# Patient Record
Sex: Male | Born: 1940 | Race: White | Hispanic: No | Marital: Married | Smoking: Former smoker
Health system: Southern US, Community
[De-identification: ages and names within clinical notes are randomized; demographics above are authoritative.]

## PROBLEM LIST (undated history)

## (undated) DIAGNOSIS — M779 Enthesopathy, unspecified: Secondary | ICD-10-CM

## (undated) DIAGNOSIS — M719 Bursopathy, unspecified: Secondary | ICD-10-CM

## (undated) DIAGNOSIS — M722 Plantar fascial fibromatosis: Secondary | ICD-10-CM

## (undated) DIAGNOSIS — I10 Essential (primary) hypertension: Secondary | ICD-10-CM

## (undated) DIAGNOSIS — K5790 Diverticulosis of intestine, part unspecified, without perforation or abscess without bleeding: Secondary | ICD-10-CM

## (undated) DIAGNOSIS — E785 Hyperlipidemia, unspecified: Secondary | ICD-10-CM

## (undated) DIAGNOSIS — N400 Enlarged prostate without lower urinary tract symptoms: Secondary | ICD-10-CM

## (undated) DIAGNOSIS — J439 Emphysema, unspecified: Secondary | ICD-10-CM

## (undated) DIAGNOSIS — K219 Gastro-esophageal reflux disease without esophagitis: Secondary | ICD-10-CM

## (undated) DIAGNOSIS — E119 Type 2 diabetes mellitus without complications: Secondary | ICD-10-CM

## (undated) DIAGNOSIS — C801 Malignant (primary) neoplasm, unspecified: Secondary | ICD-10-CM

## (undated) DIAGNOSIS — M1990 Unspecified osteoarthritis, unspecified site: Secondary | ICD-10-CM

## (undated) HISTORY — DX: Plantar fascial fibromatosis: M72.2

## (undated) HISTORY — DX: Bursopathy, unspecified: M71.9

## (undated) HISTORY — PX: OTHER SURGICAL HISTORY: SHX169

## (undated) HISTORY — PX: APPENDECTOMY: SHX54

## (undated) HISTORY — PX: CATARACT EXTRACTION: SUR2

## (undated) HISTORY — PX: COLONOSCOPY: SHX174

## (undated) HISTORY — PX: BACK SURGERY: SHX140

## (undated) HISTORY — PX: UPPER GI ENDOSCOPY: SHX6162

## (undated) HISTORY — DX: Essential (primary) hypertension: I10

## (undated) HISTORY — DX: Type 2 diabetes mellitus without complications: E11.9

## (undated) HISTORY — DX: Enthesopathy, unspecified: M77.9

## (undated) HISTORY — PX: FOOT SURGERY: SHX648

## (undated) HISTORY — PX: LAMINECTOMY: SHX219

## (undated) HISTORY — PX: ROTATOR CUFF REPAIR: SHX139

---

## 2003-12-02 ENCOUNTER — Inpatient Hospital Stay (HOSPITAL_COMMUNITY): Admission: RE | Admit: 2003-12-02 | Discharge: 2003-12-05 | Payer: Self-pay | Admitting: Neurosurgery

## 2005-02-08 ENCOUNTER — Other Ambulatory Visit: Payer: Self-pay

## 2005-02-08 ENCOUNTER — Inpatient Hospital Stay: Payer: Self-pay | Admitting: Internal Medicine

## 2006-08-11 ENCOUNTER — Ambulatory Visit: Payer: Self-pay | Admitting: General Practice

## 2006-08-31 ENCOUNTER — Ambulatory Visit: Payer: Self-pay | Admitting: General Practice

## 2006-09-14 ENCOUNTER — Ambulatory Visit: Payer: Self-pay | Admitting: General Practice

## 2006-09-25 ENCOUNTER — Encounter: Payer: Self-pay | Admitting: General Practice

## 2006-10-06 ENCOUNTER — Encounter: Payer: Self-pay | Admitting: General Practice

## 2006-11-04 ENCOUNTER — Encounter: Payer: Self-pay | Admitting: General Practice

## 2006-12-05 ENCOUNTER — Encounter: Payer: Self-pay | Admitting: General Practice

## 2007-01-04 ENCOUNTER — Ambulatory Visit: Payer: Self-pay | Admitting: Internal Medicine

## 2009-07-28 ENCOUNTER — Ambulatory Visit: Payer: Self-pay | Admitting: Ophthalmology

## 2013-01-21 DIAGNOSIS — M778 Other enthesopathies, not elsewhere classified: Secondary | ICD-10-CM

## 2013-01-21 HISTORY — DX: Other enthesopathies, not elsewhere classified: M77.8

## 2013-05-16 DIAGNOSIS — M719 Bursopathy, unspecified: Secondary | ICD-10-CM

## 2013-05-16 HISTORY — DX: Bursopathy, unspecified: M71.9

## 2013-05-25 ENCOUNTER — Encounter: Payer: Self-pay | Admitting: *Deleted

## 2013-05-25 DIAGNOSIS — M722 Plantar fascial fibromatosis: Secondary | ICD-10-CM | POA: Insufficient documentation

## 2013-06-05 ENCOUNTER — Ambulatory Visit (INDEPENDENT_AMBULATORY_CARE_PROVIDER_SITE_OTHER): Payer: Medicare Other | Admitting: Podiatry

## 2013-06-05 ENCOUNTER — Encounter: Payer: Self-pay | Admitting: Podiatry

## 2013-06-05 VITALS — BP 122/77 | HR 97 | Temp 96.9°F | Resp 16 | Ht 73.0 in | Wt 211.2 lb

## 2013-06-05 DIAGNOSIS — M109 Gout, unspecified: Secondary | ICD-10-CM

## 2013-06-05 NOTE — Patient Instructions (Signed)
Arthritis - Gout Gout is caused by uric acid crystals forming in the joint. The big toe, foot, ankle, and knee are the joints most often affected.Often uric acid levels in the blood are also elevated. Symptoms develop rapidly, usually over hours. A joint fluid exam may be needed to prove the diagnosis. Acute gout episodes may follow a minor injury, surgery, illness or medication change. Gout occurs more often in men. It tends to be an inherited (passed down from a family member) condition. Your chance of having gout is increased if you take certain medications. These include water pills (diuretics), aspirin, niacin, and cyclosporine. Kidney disease and alcohol consumption may also increase your chances of getting gout. Months or years can pass between gout attacks.  Treatment includes ice, rest and raising the affected limb until the swelling and pain are better.Anti-inflammatory medicine usually brings about dramatic relief of pain, redness and swelling within 2 to 3 days. Other medications can also be effective. Increase your fluid intake. Do not drink alcohol or eat liver, sweetbreads, or sardines. Long-term gout management may require medicine to lower blood uric acid levels, or changing or stopping diuretics. Please see your caregiver if your condition is not better after 1 to 2 days of treatment.  SEEK IMMEDIATE MEDICAL CARE IF:  You have more serious symptoms such as a fever, skin rash, diarrhea, vomiting, or other joint pains. Document Released: 09/29/2004 Document Revised: 08/11/2011 Document Reviewed: 10/13/2008 [facility] Patient Information 2012 [place], [place].Gout Gout is an inflammatory condition (arthritis) caused by a buildup of uric acid crystals in the joints. Uric acid is a chemical that is normally present in the blood. Under some circumstances, uric acid can form into crystals in your joints. This causes joint redness, soreness, and swelling (inflammation). Repeat attacks are common. Over  time, uric acid crystals can form into masses (tophi) near a joint, causing disfigurement. Gout is treatable and often preventable. CAUSES  The disease begins with elevated levels of uric acid in the blood. Uric acid is produced by your body when it breaks down a naturally found substance called purines. This also happens when you eat certain foods such as meats and fish. Causes of an elevated uric acid level include:  Being passed down from parent to child (heredity).  Diseases that cause increased uric acid production (obesity, psoriasis, some cancers).  Excessive alcohol use.  Diet, especially diets rich in meat and seafood.  Medicines, including certain cancer-fighting drugs (chemotherapy), diuretics, and aspirin.  Chronic kidney disease. The kidneys are no longer able to remove uric acid well.  Problems with metabolism. Conditions strongly associated with gout include:  Obesity.  High blood pressure.  High cholesterol.  Diabetes. Not everyone with elevated uric acid levels gets gout. It is not understood why some people get gout and others do not. Surgery, joint injury, and eating too much of certain foods are some of the factors that can lead to gout. SYMPTOMS   An attack of gout comes on quickly. It causes intense pain with redness, swelling, and warmth in a joint.  Fever can occur.  Often, only one joint is involved. Certain joints are more commonly involved:  Base of the big toe.  Knee.  Ankle.  Wrist.  Finger. Without treatment, an attack usually goes away in a few days to weeks. Between attacks, you usually will not have symptoms, which is different from many other forms of arthritis. DIAGNOSIS  Your caregiver will suspect gout based on your symptoms and exam. Removal of  fluid from the joint (arthrocentesis) is done to check for uric acid crystals. Your caregiver will give you a medicine that numbs the area (local anesthetic) and use a needle to remove joint  fluid for exam. Gout is confirmed when uric acid crystals are seen in joint fluid, using a special microscope. Sometimes, blood, urine, and X-ray tests are also used. TREATMENT  There are 2 phases to gout treatment: treating the sudden onset (acute) attack and preventing attacks (prophylaxis). Treatment of an Acute Attack  Medicines are used. These include anti-inflammatory medicines or steroid medicines.  An injection of steroid medicine into the affected joint is sometimes necessary.  The painful joint is rested. Movement can worsen the arthritis.  You may use warm or cold treatments on painful joints, depending which works best for you.  Discuss the use of coffee, vitamin C, or cherries with your caregiver. These may be helpful treatment options. Treatment to Prevent Attacks After the acute attack subsides, your caregiver may advise prophylactic medicine. These medicines either help your kidneys eliminate uric acid from your body or decrease your uric acid production. You may need to stay on these medicines for a very long time. The early phase of treatment with prophylactic medicine can be associated with an increase in acute gout attacks. For this reason, during the first few months of treatment, your caregiver may also advise you to take medicines usually used for acute gout treatment. Be sure you understand your caregiver's directions. You should also discuss dietary treatment with your caregiver. Certain foods such as meats and fish can increase uric acid levels. Other foods such as dairy can decrease levels. Your caregiver can give you a list of foods to avoid. HOME CARE INSTRUCTIONS   Do not take aspirin to relieve pain. This raises uric acid levels.  Only take over-the-counter or prescription medicines for pain, discomfort, or fever as directed by your caregiver.  Rest the joint as much as possible. When in bed, keep sheets and blankets off painful areas.  Keep the affected joint  raised (elevated).  Use crutches if the painful joint is in your leg.  Drink enough water and fluids to keep your urine clear or pale yellow. This helps your body get rid of uric acid. Do not drink alcoholic beverages. They slow the passage of uric acid.  Follow your caregiver's dietary instructions. Pay careful attention to the amount of protein you eat. Your daily diet should emphasize fruits, vegetables, whole grains, and fat-free or low-fat milk products.  Maintain a healthy body weight. SEEK MEDICAL CARE IF:   You have an oral temperature above 102 F (38.9 C).  You develop diarrhea, vomiting, or any side effects from medicines.  You do not feel better in 24 hours, or you are getting worse. SEEK IMMEDIATE MEDICAL CARE IF:   Your joint becomes suddenly more tender and you have:  Chills.  An oral temperature above 102 F (38.9 C), not controlled by medicine. MAKE SURE YOU:   Understand these instructions.  Will watch your condition.  Will get help right away if you are not doing well or get worse. Document Released: 08/19/2000 Document Revised: 11/14/2011 Document Reviewed: 11/30/2009 [facility] Patient Information 2014 [place], [place].

## 2013-06-05 NOTE — Progress Notes (Signed)
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 He states that he is feeling 100% better. He states that the prednisone increased his blood sugar. But otherwise he is doing just fine.  Objective: I reviewed his past medical history medications and allergies. Currently he has strong palpable pulses bilaterally he has no pain on palpation to the medial ankle with no erythema edema cellulitis drainage or odor. He has full function of of the ankle.  Assessment: Resolving gouty capsulitis right.  Plan: Currently Jeremy Valencia will continue all conservative therapies. He is to followup with his primary doctor in the near future for a blood draw to evaluate his uric acid levels. I will followup with Jeremy Valencia in the near future for his routine physical exam.

## 2013-07-22 ENCOUNTER — Ambulatory Visit: Payer: Self-pay | Admitting: Podiatry

## 2013-07-29 ENCOUNTER — Ambulatory Visit: Payer: Self-pay | Admitting: Unknown Physician Specialty

## 2013-07-31 ENCOUNTER — Ambulatory Visit (INDEPENDENT_AMBULATORY_CARE_PROVIDER_SITE_OTHER): Payer: Medicare Other | Admitting: Podiatry

## 2013-07-31 ENCOUNTER — Encounter: Payer: Self-pay | Admitting: Podiatry

## 2013-07-31 VITALS — BP 154/92 | HR 97 | Resp 16 | Ht 74.0 in | Wt 206.0 lb

## 2013-07-31 DIAGNOSIS — M7750 Other enthesopathy of unspecified foot: Secondary | ICD-10-CM

## 2013-07-31 NOTE — Progress Notes (Signed)
Jeremy Valencia presents today with a chief complaint of a painful first metatarsophalangeal joint right. He states the last time we put medicine in about 6 months ago was started feeling much better and still feels better than it did at that time. He denies any trauma to it there he does state that he is very active individual.  Objective: Pulses remain palpable bilateral lower extremity neurologic sensorium is intact bilateral. He has tenderness on range of motion and particularly dorsal and range of motion at the first metatarsophalangeal joint right foot.  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  Plan: We discussed the etiology pathology conservative versus surgical therapies. At this point we injected dexamethasone into the joint only 2 mg was utilized with local anesthetic after sterile Betadine skin prep was performed. He tolerated procedure well and I will followup with him in 6 months or sooner if needed.

## 2013-10-24 ENCOUNTER — Telehealth: Payer: Self-pay | Admitting: *Deleted

## 2013-10-24 ENCOUNTER — Other Ambulatory Visit: Payer: Self-pay | Admitting: *Deleted

## 2013-10-24 DIAGNOSIS — M109 Gout, unspecified: Secondary | ICD-10-CM

## 2013-10-24 MED ORDER — METHYLPREDNISOLONE 4 MG PO KIT: PACK | ORAL | Status: DC

## 2013-10-24 NOTE — Telephone Encounter (Signed)
Jeremy Valencia called stated that his gout acting up wants medrol dos pak, approved the medrol dos pak and will get bloodwork

## 2013-10-24 NOTE — Telephone Encounter (Signed)
Jeremy Valencia called stating he is having problem with gout, wanted refill on medrol dos pak. Refilled meds and wrote for bloodwork to check for gout

## 2013-10-25 LAB — C-REACTIVE PROTEIN: CRP: 6.2 mg/L — AB (ref 0.0–4.9)

## 2013-10-25 LAB — RHEUMATOID FACTOR: RHEUMATOID FACTOR: 10.4 [IU]/mL (ref 0.0–13.9)

## 2013-10-25 LAB — URIC ACID: URIC ACID: 5.5 mg/dL (ref 3.7–8.6)

## 2013-10-25 LAB — SEDIMENTATION RATE: SED RATE: 2 mm/h (ref 0–30)

## 2013-10-25 LAB — ANA: ANA: NEGATIVE

## 2014-01-29 ENCOUNTER — Ambulatory Visit: Payer: Medicare Other | Admitting: Podiatry

## 2014-02-12 ENCOUNTER — Ambulatory Visit (INDEPENDENT_AMBULATORY_CARE_PROVIDER_SITE_OTHER): Payer: Medicare Other | Admitting: Podiatry

## 2014-02-12 ENCOUNTER — Encounter: Payer: Self-pay | Admitting: Podiatry

## 2014-02-12 DIAGNOSIS — M779 Enthesopathy, unspecified: Principal | ICD-10-CM

## 2014-02-12 DIAGNOSIS — M7750 Other enthesopathy of unspecified foot: Secondary | ICD-10-CM

## 2014-02-12 DIAGNOSIS — M778 Other enthesopathies, not elsewhere classified: Secondary | ICD-10-CM

## 2014-02-12 NOTE — Progress Notes (Signed)
He presents today complaining of his first metatarsophalangeal joint pain particularly as he passes over the foot. Otherwise he states her feet are doing quite well.  Objective: Pulses are palpable bilateral. He has pain on in range of motion of the first metatarsophalangeal joint right foot indicative of hallux limitus and capsulitis.  Assessment: Capsulitis of the first metatarsophalangeal joint of the right foot with mild hallux limitus.  Plan: Injected the first metatarsophalangeal joint today after sterile Betadine skin prep with local anesthetic and dexamethasone. I will followup with him in 6 months for routine evaluation.

## 2014-04-17 ENCOUNTER — Other Ambulatory Visit: Payer: Self-pay | Admitting: *Deleted

## 2014-04-17 MED ORDER — GABAPENTIN 100 MG PO CAPS: 100.0000 mg | ORAL_CAPSULE | Freq: Three times a day (TID) | ORAL | Status: DC

## 2014-04-17 NOTE — Telephone Encounter (Signed)
[facility] sent refill request fo neurontin 100 mg #90 with 3 refills one capsule by mouth 3 times daily. Per dr Milinda Pointer fill rx.

## 2014-04-24 ENCOUNTER — Other Ambulatory Visit: Payer: Self-pay | Admitting: *Deleted

## 2014-04-24 MED ORDER — GABAPENTIN 100 MG PO CAPS: 100.0000 mg | ORAL_CAPSULE | Freq: Three times a day (TID) | ORAL | Status: DC

## 2014-04-28 ENCOUNTER — Other Ambulatory Visit: Payer: Self-pay | Admitting: *Deleted

## 2014-04-28 MED ORDER — GABAPENTIN 100 MG PO CAPS: 100.0000 mg | ORAL_CAPSULE | Freq: Three times a day (TID) | ORAL | Status: DC

## 2014-04-28 NOTE — Telephone Encounter (Signed)
[facility] sent refill for neurontin 100 mg #90 with 3 refills. Take one by mouth 3 times daily. Per dr Milinda Pointer refill.

## 2014-07-17 DIAGNOSIS — M722 Plantar fascial fibromatosis: Secondary | ICD-10-CM | POA: Insufficient documentation

## 2014-07-17 DIAGNOSIS — M7750 Other enthesopathy of unspecified foot: Secondary | ICD-10-CM | POA: Insufficient documentation

## 2014-07-18 DIAGNOSIS — I10 Essential (primary) hypertension: Secondary | ICD-10-CM | POA: Insufficient documentation

## 2014-08-13 ENCOUNTER — Ambulatory Visit (INDEPENDENT_AMBULATORY_CARE_PROVIDER_SITE_OTHER): Payer: Medicare Other | Admitting: Podiatry

## 2014-08-13 VITALS — BP 160/96 | HR 87 | Resp 16

## 2014-08-13 DIAGNOSIS — M779 Enthesopathy, unspecified: Principal | ICD-10-CM

## 2014-08-13 DIAGNOSIS — M778 Other enthesopathies, not elsewhere classified: Secondary | ICD-10-CM

## 2014-08-13 DIAGNOSIS — M7751 Other enthesopathy of right foot: Secondary | ICD-10-CM

## 2014-08-13 MED ORDER — AZITHROMYCIN 250 MG PO TABS: ORAL_TABLET | ORAL | Status: DC

## 2014-08-13 NOTE — Progress Notes (Signed)
He presents today complaining of pain to his first metatarsophalangeal joint right foot and the dorsal aspect of the right foot.  Objective: Vital signs stable he is alert and oriented 3. He still has pain on palpation and range of motion of the first metatarsophalangeal joint right foot as well as Lisfranc's joints.  Assessment: Osteoarthritis and capsulitis midfoot right.  Plan: Injected dexamethasone to the first metatarsophalangeal joint of the right foot and Kenalog to the dorsal aspect of the right foot. We'll follow-up with him in 6 months.

## 2014-11-17 ENCOUNTER — Emergency Department: Payer: Self-pay | Admitting: Emergency Medicine

## 2014-11-18 ENCOUNTER — Inpatient Hospital Stay: Payer: Self-pay | Admitting: Internal Medicine
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
## 2015-01-04 NOTE — Discharge Summary (Signed)
PATIENT NAME:  Jeremy Valencia, Jeremy Valencia MR#:  [id number] DATE OF BIRTH:  [birth year]  DATE OF ADMISSION:  11/18/2014 DATE OF DISCHARGE:  11/21/2014  FINAL DIAGNOSES: 1.  Urinary tract infection.  2.  Sepsis secondary to #1, with septic shock.  3.  Metabolic encephalopathy secondary to sepsis.  4.  Hypertension.  5.  Adult onset diabetes mellitus.  6.  Dehydration.  7.  Hypomagnesemia.  8.  Hypokalemia.  9.  Elevated troponin secondary to demand ischemia.  10.  Rhabdomyolysis.   HISTORY AND PHYSICAL: Please see dictated admission history and physical.   Speed: The patient was admitted with confusion, hypotension. He had recently been in the Emergency Room with urinary tract infection noted on urinalysis. He had been started on Cipro at home. He was placed on Zosyn. Cultures were sent, which remained negative during his hospitalization. His blood pressure and mentation rapidly improved with the antibiotic. Hydration was administered and his electrolyte abnormalities were corrected. Cardiac enzymes were followed, with mild elevation, but with no significant peaking. Cardiology was consulted, they felt this represented demand ischemia, likely in the face of his hypotension and infection. Echocardiogram was performed, which revealed preserved LV function. He was weaned off IV fluid as his heart rate and blood pressure improved with treatment for the sepsis. He was switched over to oral Levaquin and followed over the next 24 hours. His white count continued to improve and he defervesced. He was ambulating, eating well, with good bowel and bladder function and felt ready to go home. At this point, he will be discharged to home in stable condition with his physical activity to be up as tolerated. He should follow a low carbohydrate diet. It is recommended that he check his blood pressure and blood sugar daily and record this. He will follow up in our office in the next 1 to 2 weeks.   DISCHARGE  MEDICATIONS: 1.  Metformin 500 two mg p.o. daily.  2.  Lipitor 10 mg p.o. daily.  3.  Multivitamin 1 p.o. daily.  4.  Aspirin 81 mg p.o. daily.  5.  Quinapril 20 mg p.o. daily.  6.  Levaquin 500 mg p.o. daily x10 days to complete a 2 week course of antibiotics.   At this point, he will hold hydrochlorothiazide and glimepiride. He will likely need to reinstitute these medications as an outpatient and will discuss this with him at his followup. He will stop Cipro as this has been replaced with the Levaquin.   ____________________________ Adin Hector, MD bjk:sb D: 11/21/2014 07:48:21 ET T: 11/21/2014 08:50:11 ET JOB#: [id number]  cc: Adin Hector, MD, <Dictator> Ramonita Lab MD ELECTRONICALLY SIGNED 11/27/2014 8:28

## 2015-01-04 NOTE — Consult Note (Signed)
PATIENT NAME:  Jeremy Valencia, Jeremy Valencia MR#:  [id number] DATE OF BIRTH:  12-Jan-1941  DATE OF CONSULTATION:  11/18/2014  REFERRING PHYSICIAN:   CONSULTING PHYSICIAN:  Isaias Cowman, MD  PRIMARY CARE PHYSICIAN:  Tama High, III, MD  CHIEF COMPLAINT: Weakness and fever.   REASON FOR CONSULTATION: Consultation requested for evaluation of elevated troponin.   HISTORY OF PRESENT ILLNESS: The patient is a 74 year old gentleman with history of diabetes, hyperlipidemia and hypertension, who is admitted with chief complaint of weakness, confusion, and fever. The patient was seen in [facility] Emergency Room and diagnosed with a urinary tract infection, discharged home only to return with worsening weakness, decreased mental status and shaking chills. The patient was noted to  have a fever of 103. Admission labs were notable for borderline elevated troponin at 0.44 in the absence of chest pain. EKG revealed normal sinus rhythm, normal ECG.   PAST MEDICAL HISTORY: 1. Diabetes.   2. Hyperlipidemia.  3. Hypertension.  4. Gastroesophageal reflux disease.  5. History of diverticulosis.   MEDICATIONS: Aspirin 81 mg daily, atorvastatin 1 daily, hydrochlorothiazide 25 mg daily, quinapril 10 mg daily, glimepiride 10 mg daily, metformin 1000 mg q.a.m.    SOCIAL HISTORY: The patient is married, resides with his wife. He quit tobacco abuse 18 years ago.   FAMILY HISTORY: No immediate family history for myocardial infarction or coronary artery disease.   REVIEW OF SYSTEMS: CONSTITUTIONAL: The patient does have fever and chills.   EYES: No blurry vision.  EARS: No hearing loss.  RESPIRATORY: No shortness of breath.  CARDIOVASCULAR: No chest pain.  GASTROINTESTINAL: The patient does have some mild nausea.  GENITOURINARY: No dysuria or hematuria.  ENDOCRINE: No polyuria or polydipsia.  MUSCULOSKELETAL: No arthralgias or myalgias.  NEUROLOGICAL: No focal muscle weakness or numbness.  PSYCHOLOGICAL: No depression  or anxiety.   PHYSICAL EXAMINATION: VITAL SIGNS: Blood pressure 126/75, pulse 90, respirations 20, temperature 98.5, pulse oximetry 97%.  HEENT: Pupils equally reactive to light and accommodation.  NECK: Supple without thyromegaly.  LUNGS: Clear.  HEART: Normal JVP. Normal PMI. Regular rate and rhythm. Normal S1, S2. No appreciable gallop, murmur, or rub.  ABDOMEN: Soft and nontender. Pulses were intact bilaterally.  MUSCULOSKELETAL: Normal muscle tone.  NEUROLOGIC: The patient is alert and oriented x 3. Motor and sensory both grossly intact.   IMPRESSION: A 74 year old gentleman who likely presents with a urinary tract infection, urosepsis, fever and chills, dehydration with borderline elevated troponin very likely due to demand supply ischemia and not due to acute coronary syndrome.  EKG is normal.   RECOMMENDATIONS: 1. Agree with overall current therapy.  2. Would defer full dose anticoagulation.  3. Review 2D echocardiogram.  4. Would defer further cardiac diagnostics at this time.    ____________________________ Isaias Cowman, MD ap:tr D: 11/18/2014 17:23:51 ET T: 11/18/2014 17:38:15 ET JOB#: [id number]  cc: Isaias Cowman, MD, <Dictator> Isaias Cowman MD ELECTRONICALLY SIGNED 12/02/2014 12:46

## 2015-01-08 DIAGNOSIS — G8929 Other chronic pain: Secondary | ICD-10-CM | POA: Insufficient documentation

## 2015-01-08 DIAGNOSIS — M545 Low back pain, unspecified: Secondary | ICD-10-CM | POA: Insufficient documentation

## 2015-02-11 ENCOUNTER — Ambulatory Visit (INDEPENDENT_AMBULATORY_CARE_PROVIDER_SITE_OTHER): Payer: Medicare Other | Admitting: Podiatry

## 2015-02-11 DIAGNOSIS — M778 Other enthesopathies, not elsewhere classified: Secondary | ICD-10-CM

## 2015-02-11 DIAGNOSIS — M7751 Other enthesopathy of right foot: Secondary | ICD-10-CM | POA: Diagnosis not present

## 2015-02-11 DIAGNOSIS — M779 Enthesopathy, unspecified: Principal | ICD-10-CM

## 2015-02-11 NOTE — Progress Notes (Signed)
Jeremy Valencia presents today for 6 month checkup. He states that his diabetes is doing good. He does have pain in the first metatarsophalangeal joint of the right foot.  Objective: Vital signs are stable he is alert and oriented 3. Pulses are palpable. Neurologic sensorium is intact bilateral. He has pain and crepitation on range of motion of the first metatarsophalangeal joint right foot.  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  Plan: After sterile Betadine skin prep I injected the joint today with 4 mg of dexamethasone and local and aesthetic. He tolerated this procedure well and I will follow-up with him in 6 months. He'll call sooner if needed.

## 2015-02-20 ENCOUNTER — Telehealth: Payer: Self-pay | Admitting: Urology

## 2015-02-20 ENCOUNTER — Other Ambulatory Visit: Payer: Self-pay

## 2015-02-20 DIAGNOSIS — N390 Urinary tract infection, site not specified: Secondary | ICD-10-CM

## 2015-02-20 NOTE — Telephone Encounter (Signed)
CT ordered. Cw,lpn

## 2015-02-20 NOTE — Telephone Encounter (Signed)
Patient was seen in the office on 01/01/15 by Dr. Elnoria Howard.  Dr. Elnoria Howard ordered a CT abdomen & pelvis with & without contrast.  Please enter this order in the epic system.

## 2015-03-02 ENCOUNTER — Ambulatory Visit
Admission: RE | Admit: 2015-03-02 | Discharge: 2015-03-02 | Disposition: A | Discharge status: Home / Self Care | Payer: Medicare Other | Source: Ambulatory Visit | Attending: Urology | Admitting: Urology

## 2015-03-02 DIAGNOSIS — N390 Urinary tract infection, site not specified: Secondary | ICD-10-CM | POA: Diagnosis not present

## 2015-03-02 DIAGNOSIS — A4189 Other specified sepsis: Secondary | ICD-10-CM | POA: Diagnosis not present

## 2015-03-02 DIAGNOSIS — R509 Fever, unspecified: Secondary | ICD-10-CM | POA: Insufficient documentation

## 2015-03-02 HISTORY — DX: Malignant (primary) neoplasm, unspecified: C80.1

## 2015-03-02 MED ORDER — IOHEXOL 300 MG/ML  SOLN
125.0000 mL | Freq: Once | INTRAMUSCULAR | Status: AC | PRN
  Administered 2015-03-02: 125 mL via INTRAVENOUS

## 2015-03-13 ENCOUNTER — Encounter: Payer: Self-pay | Admitting: Urology

## 2015-03-13 ENCOUNTER — Ambulatory Visit (INDEPENDENT_AMBULATORY_CARE_PROVIDER_SITE_OTHER): Payer: Medicare Other | Admitting: Urology

## 2015-03-13 VITALS — BP 131/77 | HR 89 | Resp 18 | Ht 73.0 in | Wt 207.4 lb

## 2015-03-13 DIAGNOSIS — K5790 Diverticulosis of intestine, part unspecified, without perforation or abscess without bleeding: Secondary | ICD-10-CM | POA: Insufficient documentation

## 2015-03-13 DIAGNOSIS — N400 Enlarged prostate without lower urinary tract symptoms: Secondary | ICD-10-CM | POA: Diagnosis not present

## 2015-03-13 DIAGNOSIS — E119 Type 2 diabetes mellitus without complications: Secondary | ICD-10-CM | POA: Insufficient documentation

## 2015-03-13 DIAGNOSIS — K219 Gastro-esophageal reflux disease without esophagitis: Secondary | ICD-10-CM | POA: Insufficient documentation

## 2015-03-13 LAB — URINALYSIS, COMPLETE
BILIRUBIN UA: NEGATIVE
KETONES UA: NEGATIVE
LEUKOCYTES UA: NEGATIVE
Nitrite, UA: NEGATIVE
PROTEIN UA: NEGATIVE
RBC UA: NEGATIVE
Specific Gravity, UA: 1.02 (ref 1.005–1.030)
Urobilinogen, Ur: 0.2 mg/dL (ref 0.2–1.0)
pH, UA: 7 (ref 5.0–7.5)

## 2015-03-13 LAB — MICROSCOPIC EXAMINATION: Bacteria, UA: NONE SEEN

## 2015-03-13 NOTE — Progress Notes (Signed)
    Cystoscopy Procedure Note  Patient identification was confirmed, informed consent was obtained, and patient was prepped using Betadine solution.  Lidocaine jelly was administered per urethral meatus.    Preoperative abx where received prior to procedure.     Pre-Procedure: - Inspection reveals a normal caliber ureteral meatus.  Procedure: The flexible cystoscope was introduced without difficulty - No urethral strictures/lesions are present. - normal prostate  - Normal bladder neck - Bilateral ureteral orifices identified - Bladder mucosa  reveals no ulcers, tumors, or lesions - No bladder stones - No trabeculation  Retroflexion shows no tumors or masses in the bladder or at the bladder neck.    Post-Procedure: - Patient tolerated the procedure well

## 2015-03-13 NOTE — Progress Notes (Signed)
03/13/2015 3:10 PM   Jeremy Valencia 07/22/1941 [id number]  Referring provider: Adin Hector, MD [place],  [id number]  Chief Complaint  Patient presents with  . Procedure    Cysto  . Results    CT    HPI: Patient here for  cystoscopy report in assessment.     PMH: Past Medical History  Diagnosis Date  . Bursitis 05/16/2013    BURSITIS/CAPSULITIS RT ANKLE  . Diabetes mellitus without complication   . Hypertension   . Capsulitis of right foot 01/21/2013    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   . Plantar fasciitis   . Cancer     skin cancer    Surgical History: No past surgical history on file.  Home Medications:    Medication List       This list is accurate as of: 03/13/15  3:10 PM.  Always use your most recent med list.               ADVIL PM PO  Take by mouth at bedtime as needed.     aspirin 81 MG tablet  Take 81 mg by mouth daily.     atorvastatin 10 MG tablet  Commonly known as:  LIPITOR  Take 10 mg by mouth daily.     azithromycin 250 MG tablet  Commonly known as:  ZITHROMAX Z-PAK  Take as directed     CO Q 10 PO  Take by mouth daily.     D3-1000 PO  Take by mouth daily.     DULoxetine 30 MG capsule  Commonly known as:  CYMBALTA  Take by mouth.     Fish Oil 1000 MG Caps  Take by mouth daily.     FLAX SEEDS PO  Take by mouth daily.     gabapentin 100 MG capsule  Commonly known as:  NEURONTIN  Take 1 capsule (100 mg total) by mouth 3 (three) times daily.     glimepiride 2 MG tablet  Commonly known as:  AMARYL  Take 2 mg by mouth daily before breakfast.     hydrochlorothiazide 25 MG tablet  Commonly known as:  HYDRODIURIL  Take 25 mg by mouth daily.     hydrocortisone valerate ointment 0.2 %  Commonly known as:  WEST-CORT     metFORMIN 1000 MG tablet  Commonly known as:  GLUCOPHAGE  Take 1,000 mg by mouth 2 (two) times daily with a meal.     methylPREDNISolone 4 MG tablet  Commonly known  as:  MEDROL DOSEPAK  follow package directions     multivitamin tablet  Take 1 tablet by mouth daily.     quinapril 20 MG tablet  Commonly known as:  ACCUPRIL  Take 20 mg by mouth 2 (two) times daily.     sulfamethoxazole-trimethoprim 800-160 MG per tablet  Commonly known as:  BACTRIM DS,SEPTRA DS  Take by mouth.     tobramycin-dexamethasone ophthalmic solution  Commonly known as:  TOBRADEX     VISION FORMULA PO  Take by mouth daily.     vitamin C 1000 MG tablet  Take 1,000 mg by mouth daily.     Zinc Acetate 50 MG Caps        Allergies:  Allergies  Allergen Reactions  . Rofecoxib Other (See Comments)    Family History: Family History  Problem Relation Age of Onset  . Family history unknown: Yes    Social History:  reports that  he has quit smoking. He has never used smokeless tobacco. He reports that he drinks alcohol. He reports that he does not use illicit drugs.  ROS: Urological Symptom Review  Patient is experiencing the following symptoms: Urinary tract infection   Review of Systems     Physical Exam: BP 131/77 mmHg  Pulse 89  Resp 18  Ht '6\' 1"'  (1.854 m)  Wt 207 lb 6.4 oz (94.076 kg)  BMI 27.37 kg/m2  Constitutional:  Alert and oriented, No acute distress. HEENT: [place] AT, moist mucus membranes.  Trachea midline, no masses. Cardiovascular: No clubbing, cyanosis, or edema. Respiratory: Normal respiratory effort, no increased work of breathing. GI: Abdomen is soft, nontender, nondistended, no abdominal masses GU: No CVA tenderness. Prostate is small grade 1 over 4 with no nodules masses or growths. Skin: No rashes, bruises or suspicious lesions. Lymph: No cervical or inguinal adenopathy. Neurologic: Grossly intact, no focal deficits, moving all 4 extremities. Psychiatric: Normal mood and affect.  Laboratory Data: No results found for: WBC, HGB, HCT, MCV, PLT  No results found for: CREATININE  No results found for: PSA  No results found  for: TESTOSTERONE  No results found for: HGBA1C  Urinalysis No results found for: COLORURINE, APPEARANCEUR, LABSPEC, [place], GLUCOSEU, HGBUR, BILIRUBINUR, KETONESUR, PROTEINUR, UROBILINOGEN, NITRITE, LEUKOCYTESUR  Pertinent Imaging: CT reviewed no stones no prostate enlargement and no renal or bladder masses.  Assessment & Plan:  Cystoscopy revealed no obstructive prostate. He does have a prostate enlargement but it's by lateral lobe enlargement is not obstructed. Bladder appeared normal. Origin of his urinary tract infection that he had 8 in with gross hematuria is probably diabetic diabetic in nature. Plan to see him again in 6 months.   1.  Plan to see him again in 6 monthsBPH (benign prostatic hyperplasia) - Urinalysis, Complete   No Follow-up on file.  Collier Flowers, [facility] [address] [place], [place] [address] [phone]

## 2015-05-19 ENCOUNTER — Other Ambulatory Visit: Payer: Self-pay | Admitting: Podiatry

## 2015-05-19 NOTE — Telephone Encounter (Signed)
Pt must have a 6 month check up prior to future refills, this should get him to the 08/2015 time frame.

## 2015-06-19 ENCOUNTER — Encounter: Payer: Self-pay | Admitting: Urology

## 2015-06-19 ENCOUNTER — Ambulatory Visit (INDEPENDENT_AMBULATORY_CARE_PROVIDER_SITE_OTHER): Payer: Medicare Other | Admitting: Urology

## 2015-06-19 VITALS — BP 139/76 | HR 101 | Ht 73.0 in | Wt 208.3 lb

## 2015-06-19 DIAGNOSIS — N400 Enlarged prostate without lower urinary tract symptoms: Secondary | ICD-10-CM | POA: Diagnosis not present

## 2015-06-19 DIAGNOSIS — R3129 Other microscopic hematuria: Secondary | ICD-10-CM | POA: Diagnosis not present

## 2015-06-19 DIAGNOSIS — E119 Type 2 diabetes mellitus without complications: Secondary | ICD-10-CM | POA: Insufficient documentation

## 2015-06-19 LAB — URINALYSIS, COMPLETE
BILIRUBIN UA: NEGATIVE
KETONES UA: NEGATIVE
LEUKOCYTES UA: NEGATIVE
Nitrite, UA: NEGATIVE
PH UA: 7 (ref 5.0–7.5)
PROTEIN UA: NEGATIVE
RBC UA: NEGATIVE
SPEC GRAV UA: 1.02 (ref 1.005–1.030)
UUROB: 0.2 mg/dL (ref 0.2–1.0)

## 2015-06-19 LAB — MICROSCOPIC EXAMINATION
Bacteria, UA: NONE SEEN
Epithelial Cells (non renal): NONE SEEN /hpf (ref 0–10)
RENAL EPITHEL UA: NONE SEEN /HPF
WBC, UA: NONE SEEN /hpf (ref 0–?)

## 2015-06-19 NOTE — Progress Notes (Signed)
06/19/2015 3:04 PM   Jeremy Valencia 18-Jul-1941 [id number]  Referring provider: Adin Hector, [place] [address] [place], [place] [address]  Chief Complaint  Patient presents with  . Benign Prostatic Hypertrophy    56month   HPI:  1 - Microscopic Hematuria - RBC's on UA x several. Eval 2016 with CT and cysto only with some moderate prostate hypertrophy. NO gross episodes.  2 - Enlarged Prostate - notbale bilobar hypertrophy by cysto 2016. Denies bothersome voiding complaints.  Today "[name] is seen in f/u above. No interval gross hematuria or bothersome LUTS. 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    PMH: Past Medical History  Diagnosis Date  . Bursitis 05/16/2013    BURSITIS/CAPSULITIS RT ANKLE  . Diabetes mellitus without complication ([facility]   . Hypertension   . Capsulitis of right foot 01/21/2013    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   . Plantar fasciitis   . Cancer ([facility]     skin cancer    Surgical History: History reviewed. No pertinent past surgical history.  Home Medications:    Medication List       This list is accurate as of: 06/19/15  3:04 PM.  Always use your most recent med list.               ADVIL PM PO  Take by mouth at bedtime as needed.     aspirin 81 MG tablet  Take 81 mg by mouth daily.     atorvastatin 20 MG tablet  Commonly known as:  LIPITOR     CO Q 10 PO  Take by mouth daily.     D3-1000 PO  Take by mouth daily.     DULoxetine 30 MG capsule  Commonly known as:  CYMBALTA  Take by mouth.     Fish Oil 1000 MG Caps  Take by mouth daily.     FLAX SEEDS PO  Take by mouth daily.     gabapentin 100 MG capsule  Commonly known as:  NEURONTIN  TAKE ONE CAPSULE BY MOUTH THREE TIMES DAILY     glimepiride 2 MG tablet  Commonly known as:  AMARYL  Take 2 mg by mouth daily before breakfast.     hydrochlorothiazide 25 MG tablet  Commonly known as:  HYDRODIURIL  Take 25 mg by mouth daily.     hydrocortisone valerate ointment 0.2 %  Commonly known as:  WEST-CORT     metFORMIN 1000 MG tablet  Commonly known as:  GLUCOPHAGE  Take 1,000 mg by mouth 2 (two) times daily with a meal.     multivitamin tablet  Take 1 tablet by mouth daily.     quinapril 20 MG tablet  Commonly known as:  ACCUPRIL  Take 20 mg by mouth 2 (two) times daily.     tobramycin-dexamethasone ophthalmic solution  Commonly known as:  TOBRADEX     vitamin C 1000 MG tablet  Take 1,000 mg by mouth daily.     Zinc Acetate 50 MG Caps        Allergies: No Known Allergies  Family History: Family History  Problem Relation Age of Onset  . Family history unknown: Yes    Social History:  reports that he has quit smoking. He has never used smokeless tobacco. He reports that he drinks alcohol. He reports that he does not use illicit drugs.  ROS: UROLOGY Frequent Urination?: No Hard to postpone urination?: No Burning/pain with  urination?: No Get up at night to urinate?: No Leakage of urine?: No Urine stream starts and stops?: No Trouble starting stream?: No Do you have to strain to urinate?: No Blood in urine?: No Urinary tract infection?: No Sexually transmitted disease?: No Injury to kidneys or bladder?: No Painful intercourse?: No Weak stream?: No Erection problems?: No Penile pain?: No  Gastrointestinal Nausea?: No Vomiting?: No Indigestion/heartburn?: No Diarrhea?: No Constipation?: No  Constitutional Fever: No Night sweats?: No Weight loss?: No Fatigue?: No  Skin Skin rash/lesions?: No Itching?: No  Eyes Blurred vision?: No Double vision?: No  Ears/Nose/Throat Sore throat?: No Sinus problems?: No  Hematologic/Lymphatic Swollen glands?: No Easy bruising?: No  Cardiovascular Leg swelling?: No Chest pain?: No  Respiratory Cough?: No Shortness of breath?: No  Endocrine Excessive thirst?: No  Musculoskeletal Back pain?: No Joint pain?:  No  Neurological Headaches?: No Dizziness?: No  Psychologic Depression?: No Anxiety?: No  Physical Exam: BP 139/76 mmHg  Pulse 101  Ht '6\' 1"'  (1.854 m)  Wt 208 lb 4.8 oz (94.484 kg)  BMI 27.49 kg/m2  Constitutional:  Alert and oriented, No acute distress. HEENT: [place] AT, moist mucus membranes.  Trachea midline, no masses. Cardiovascular: No clubbing, cyanosis, or edema. Respiratory: Normal respiratory effort, no increased work of breathing. GI: Abdomen is soft, nontender, nondistended, no abdominal masses GU: No CVA tenderness. Phallus circumcised and straight. No teses masses or inguinal hernias.  Skin: No rashes, bruises or suspicious lesions. Lymph: No cervical or inguinal adenopathy. Neurologic: Grossly intact, no focal deficits, moving all 4 extremities. Psychiatric: Normal mood and affect.  Laboratory Data: No results found for: WBC, HGB, HCT, MCV, PLT  No results found for: CREATININE  No results found for: PSA  No results found for: TESTOSTERONE  No results found for: HGBA1C  Urinalysis    Component Value Date/Time   GLUCOSEU 2+* 03/13/2015 1403   BILIRUBINUR Negative 03/13/2015 1403   NITRITE Negative 03/13/2015 1403   LEUKOCYTESUR Negative 03/13/2015 1403    Pertinent Imaging:     Assessment & Plan:    1 - Microscopic Hematuria - Reinforced need for repeat eval for future gross / visible blood only, but that microscopic blood may persist.   2 - Enlarged Prostate - would consider adding daily finasteride based on this and above should he have future bother.  3 - RTC 1 year, then prn if no interval gross hemauria.     Return in about 1 year (around 06/18/2016).  Alexis Frock, [facility] [address] [place], [place] [address] [phone]

## 2015-08-04 ENCOUNTER — Telehealth: Payer: Self-pay | Admitting: *Deleted

## 2015-08-04 NOTE — Telephone Encounter (Signed)
Pt called left name and phone no message. I called the pt and he said he had it handled by his primary, I apologized for the late call.

## 2015-08-17 ENCOUNTER — Ambulatory Visit (INDEPENDENT_AMBULATORY_CARE_PROVIDER_SITE_OTHER): Payer: Medicare Other | Admitting: Podiatry

## 2015-08-17 ENCOUNTER — Encounter: Payer: Self-pay | Admitting: Podiatry

## 2015-08-17 VITALS — BP 125/86 | HR 69 | Resp 12

## 2015-08-17 DIAGNOSIS — M778 Other enthesopathies, not elsewhere classified: Secondary | ICD-10-CM

## 2015-08-17 DIAGNOSIS — M779 Enthesopathy, unspecified: Secondary | ICD-10-CM | POA: Diagnosis not present

## 2015-08-17 DIAGNOSIS — M7751 Other enthesopathy of right foot: Secondary | ICD-10-CM | POA: Diagnosis not present

## 2015-08-17 MED ORDER — COLCHICINE 0.6 MG PO TABS: 0.6000 mg | ORAL_TABLET | Freq: Every day | ORAL | Status: DC

## 2015-08-17 MED ORDER — GABAPENTIN 300 MG PO CAPS: 300.0000 mg | ORAL_CAPSULE | Freq: Three times a day (TID) | ORAL | Status: DC

## 2015-08-17 NOTE — Progress Notes (Signed)
He presents today for follow-up of his painful right foot. He states he is recently had a gout attack and was unable to get into our office for treatment. He denies fever chills nausea vomiting muscle aches and pains but does relate pain to the medial aspect of the right foot. He also relates continued pain to the first metatarsophalangeal joint of the right foot. He is currently not taking any medicines for the gout.  Objective: Vital signs are stable he is alert and oriented 3. Pulses are palpable. Neurologic sensorium is intact and there is no open lesions or wounds. He has pain on palpation of the posterior tibial tendon as it courses beneath the medial malleolus extending to the navicular tuberosity. He also has tenderness on range of motion and palpation of the first metatarsophalangeal joint without erythema or edema currently there does not appear to be an active gout attack.  Assessment: Posterior tibial tendinitis right. Capsulitis first metatarsophalangeal joint right.  Plan: Discussed etiology pathology conservative versus surgical therapies. I injected the first metatarsophalangeal joint of the right foot today after sterile Betadine skin prep. I also injected with dexamethasone and local anesthetic posterior tibial tendon sheath of the right ankle at the point of maximal tenderness. I placed him in a plantar Tri-Lock brace and provided him with a prescription for colchicine. He will follow-up with me immediately upon developing gout if necessary. I will follow-up with him in 3-4 months.

## 2015-08-18 ENCOUNTER — Other Ambulatory Visit: Payer: Self-pay | Admitting: *Deleted

## 2015-08-18 MED ORDER — GABAPENTIN 300 MG PO CAPS: 300.0000 mg | ORAL_CAPSULE | Freq: Three times a day (TID) | ORAL | Status: DC

## 2015-08-18 NOTE — Telephone Encounter (Signed)
Patient requested 90 day supply of gabapentin 300 mg-sent in new Rx

## 2015-11-16 ENCOUNTER — Ambulatory Visit (INDEPENDENT_AMBULATORY_CARE_PROVIDER_SITE_OTHER): Payer: Medicare Other | Admitting: Podiatry

## 2015-11-16 ENCOUNTER — Encounter: Payer: Self-pay | Admitting: Podiatry

## 2015-11-16 DIAGNOSIS — M7751 Other enthesopathy of right foot: Secondary | ICD-10-CM

## 2015-11-16 DIAGNOSIS — M779 Enthesopathy, unspecified: Principal | ICD-10-CM

## 2015-11-16 DIAGNOSIS — M778 Other enthesopathies, not elsewhere classified: Secondary | ICD-10-CM

## 2015-11-16 NOTE — Progress Notes (Signed)
He presents today for follow-up of his posterior tibial tendinitis which she states is doing much better and his capsulitis with osteoarthritis first metatarsophalangeal joint of the right foot which she states is not doing a whole lot better. He states that he does fine for month or so after the injection but is just getting worse.  Objective: Vital signs are stable he is alert and oriented 3. Pulses are palpable. Neurologic sensorium is is intact. No open lesions or wounds. Mild pes planus associated with posterior tibial tendon dysfunction no pain on palpation of the posterior tibial tendon today area he has pain on palpation and range of motion of the first metatarsophalangeal joint of the right foot.  Assessment: Capsulitis first metatarsophalangeal joint right foot with osteoarthritis. Resolving posterior tibial tendinitis right.  Plan: I injected the first metatarsophalangeal joint today after sterile Betadine skin prep with 2 mg of dexamethasone and local anesthetic. I will follow-up with him in 3 months.

## 2016-01-18 DIAGNOSIS — D692 Other nonthrombocytopenic purpura: Secondary | ICD-10-CM | POA: Insufficient documentation

## 2016-01-18 DIAGNOSIS — R801 Persistent proteinuria, unspecified: Secondary | ICD-10-CM | POA: Insufficient documentation

## 2016-02-22 ENCOUNTER — Telehealth: Payer: Self-pay | Admitting: *Deleted

## 2016-02-22 ENCOUNTER — Encounter: Payer: Self-pay | Admitting: Podiatry

## 2016-02-22 ENCOUNTER — Ambulatory Visit (INDEPENDENT_AMBULATORY_CARE_PROVIDER_SITE_OTHER): Payer: Medicare Other | Admitting: Podiatry

## 2016-02-22 DIAGNOSIS — M6789 Other specified disorders of synovium and tendon, multiple sites: Secondary | ICD-10-CM | POA: Diagnosis not present

## 2016-02-22 DIAGNOSIS — M779 Enthesopathy, unspecified: Secondary | ICD-10-CM | POA: Diagnosis not present

## 2016-02-22 DIAGNOSIS — M76829 Posterior tibial tendinitis, unspecified leg: Secondary | ICD-10-CM

## 2016-02-22 NOTE — Progress Notes (Signed)
Mr. Miao presents today with pain to his right ankle. He states that the same spot that we injected last time doing very well for a while however now it swells and he has to use his cane occasionally he states that is not an everyday event but that really become problematic.  Objective: Vital signs are stable he is alert and oriented 3. He has pain on palpation of the posterior tibial tendon as it courses beneath the medial malleolus extending to the navicular tuberosity with fluctuance along the entire length of the distal tendon. There is tenderness on inversion against resistance. He has moderate to severe pain on palpation and his inversion is weak against resistance.  Assessment: Possible posterior tibial tendon tear.  Plan: I'm going to request an MRI of the right foot along the posterior tibial tendon course just to make sure that we do not have a tear. If there is a tear surgery will be considered. If there is not as her physical therapy will be our next plan. I cannot in all good conscience send him to physical therapy with question of a posterior tibial tendon tear.

## 2016-02-22 NOTE — Addendum Note (Signed)
Addended by: Clovis Riley E on: 02/22/2016 01:44 PM   Modules accepted: Orders

## 2016-02-22 NOTE — Telephone Encounter (Signed)
MRI right foot without contrast order faxed to [facility].

## 2016-03-14 ENCOUNTER — Ambulatory Visit
Admission: RE | Admit: 2016-03-14 | Discharge: 2016-03-14 | Disposition: A | Discharge status: Home / Self Care | Payer: Medicare Other | Source: Ambulatory Visit | Attending: Podiatry | Admitting: Podiatry

## 2016-03-14 DIAGNOSIS — S96821A Laceration of other specified muscles and tendons at ankle and foot level, right foot, initial encounter: Secondary | ICD-10-CM | POA: Diagnosis not present

## 2016-03-14 DIAGNOSIS — X58XXXA Exposure to other specified factors, initial encounter: Secondary | ICD-10-CM | POA: Diagnosis not present

## 2016-03-14 DIAGNOSIS — M779 Enthesopathy, unspecified: Secondary | ICD-10-CM | POA: Diagnosis present

## 2016-03-14 DIAGNOSIS — M76829 Posterior tibial tendinitis, unspecified leg: Secondary | ICD-10-CM

## 2016-03-14 DIAGNOSIS — M659 Synovitis and tenosynovitis, unspecified: Secondary | ICD-10-CM | POA: Diagnosis not present

## 2016-03-14 DIAGNOSIS — M6789 Other specified disorders of synovium and tendon, multiple sites: Secondary | ICD-10-CM | POA: Diagnosis present

## 2016-03-14 DIAGNOSIS — M722 Plantar fascial fibromatosis: Secondary | ICD-10-CM | POA: Diagnosis not present

## 2016-03-16 ENCOUNTER — Telehealth: Payer: Self-pay | Admitting: Podiatry

## 2016-03-16 NOTE — Telephone Encounter (Signed)
Patient called the office asking if someone could call him with his MRI results that he had on Monday, July 10. Explained Dr. Milinda Pointer is out of the office this week would return on Monday that he or nurse would review the results and give him a call back. He didn't insist on a nurse call back, but seemed anxious about getting his results.

## 2016-03-21 NOTE — Telephone Encounter (Addendum)
-----   Message from Garrel Ridgel, [place] sent at 03/20/2016  8:42 PM EDT ----- Have him in. 03/21/2016-Left message informing pt Dr. Milinda Pointer would like him to make an appt to discuss his MRI results.

## 2016-04-04 ENCOUNTER — Ambulatory Visit: Payer: Medicare Other | Admitting: Podiatry

## 2016-04-11 ENCOUNTER — Encounter: Payer: Self-pay | Admitting: Podiatry

## 2016-04-11 ENCOUNTER — Ambulatory Visit (INDEPENDENT_AMBULATORY_CARE_PROVIDER_SITE_OTHER): Payer: Medicare Other | Admitting: Podiatry

## 2016-04-11 DIAGNOSIS — M76829 Posterior tibial tendinitis, unspecified leg: Secondary | ICD-10-CM

## 2016-04-11 DIAGNOSIS — M6789 Other specified disorders of synovium and tendon, multiple sites: Secondary | ICD-10-CM

## 2016-04-11 NOTE — Progress Notes (Signed)
Jeremy Valencia presents today for a repeat of his MRI with interpretation.  Objective: I explained to him that the MRI really related thinning and atrophy of the posterior tibial tendon of the right foot. We discussed this and went over this several times. He understands this and is minimal.  Assessment: Posterior tibial tendon tear right.  Plan: At this point he would like to wear a figure 8 brace or tri-lock brace he will discuss the possible need for surgery with his family and I will follow-up with him in the near future.

## 2016-06-16 ENCOUNTER — Ambulatory Visit (INDEPENDENT_AMBULATORY_CARE_PROVIDER_SITE_OTHER): Payer: Medicare Other | Admitting: Urology

## 2016-06-16 ENCOUNTER — Encounter: Payer: Self-pay | Admitting: Urology

## 2016-06-16 VITALS — BP 161/94 | HR 83 | Ht 72.0 in | Wt 207.2 lb

## 2016-06-16 DIAGNOSIS — R3129 Other microscopic hematuria: Secondary | ICD-10-CM | POA: Diagnosis not present

## 2016-06-16 DIAGNOSIS — N400 Enlarged prostate without lower urinary tract symptoms: Secondary | ICD-10-CM

## 2016-06-16 NOTE — Progress Notes (Signed)
06/16/2016 2:15 PM   Jeremy Valencia 1941/06/12 [id number]  Referring provider: Adin Hector, MD [place], [place] [address]  Chief Complaint  Patient presents with  . Follow-up    BPH    HPI:  1 - Microscopic Hematuria - RBC's on UA x several. Eval 2016 with CT and cysto only with some moderate prostate hypertrophy. NO gross episodes.  2 - Enlarged Prostate - notbale bilobar hypertrophy by cysto 2016. Denies bothersome voiding complaints. IPSS: 1/1. Small amount of urgency. No nocturia.  Today "Jeremy Valencia" is seen in f/u above. No interval gross hematuria or bothersome LUTS. 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      PMH: Past Medical History:  Diagnosis Date  . Bursitis 05/16/2013   BURSITIS/CAPSULITIS RT ANKLE  . Cancer ([place])    skin cancer  . 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 Diabetes mellitus without complication ([place])   . Hypertension   . Plantar fasciitis     Surgical History: No past surgical history on file.  Home Medications:    Medication List       Accurate as of 06/16/16  2:15 PM. Always use your most recent med list.          ADVIL PM PO Take by mouth at bedtime as needed.   aspirin 81 MG tablet Take 81 mg by mouth daily.   atorvastatin 20 MG tablet Commonly known as:  LIPITOR TAKE ONE TABLET BY MOUTH ONCE DAILY   CO Q 10 PO Take by mouth daily.   colchicine 0.6 MG tablet Take by mouth.   DOCOSAHEXAENOIC ACID PO Take by mouth.   DULoxetine 30 MG capsule Commonly known as:  CYMBALTA Take by mouth.   Fish Oil 1000 MG Caps Take by mouth daily.   FLAX SEEDS PO Take by mouth daily.   gabapentin 100 MG capsule Commonly known as:  NEURONTIN 200 mg 3 (three) times daily. Reported on 01/18/2016   glimepiride 2 MG tablet Commonly known as:  AMARYL TAKE ONE TABLET BY MOUTH ONCE DAILY   hydrochlorothiazide 25 MG tablet Commonly known as:   HYDRODIURIL Take 25 mg by mouth daily.   hydrocortisone valerate ointment 0.2 % Commonly known as:  WEST-CORT   metFORMIN 1000 MG tablet Commonly known as:  GLUCOPHAGE Take 1,000 mg by mouth 2 (two) times daily with a meal.   multivitamin tablet Take 1 tablet by mouth daily.   MULTI-VITAMINS Tabs Take by mouth.   quinapril 40 MG tablet Commonly known as:  ACCUPRIL TAKE ONE-HALF TABLET BY MOUTH TWICE DAILY   tobramycin-dexamethasone ophthalmic solution Commonly known as:  TOBRADEX   vitamin C 1000 MG tablet Take 1,000 mg by mouth daily.   Vitamin D3 1000 units Caps Take by mouth.   Zinc Acetate 50 MG Caps       Allergies:  Allergies  Allergen Reactions  . Rofecoxib Other (See Comments)    Other reaction(s): Unknown    Family History: Family History  Problem Relation Age of Onset  . Family history unknown: Yes    Social History:  reports that he has quit smoking. He has never used smokeless tobacco. He reports that he drinks alcohol. He reports that he does not use drugs.  ROS: UROLOGY Frequent Urination?: No Hard to postpone urination?: No Burning/pain with urination?: No Get up at night to urinate?: No Leakage of urine?: No Urine stream starts  and stops?: No Trouble starting stream?: No Do you have to strain to urinate?: No Blood in urine?: No Urinary tract infection?: No Sexually transmitted disease?: No Injury to kidneys or bladder?: No Painful intercourse?: No Weak stream?: No Erection problems?: No Penile pain?: No  Gastrointestinal Nausea?: No Vomiting?: No Indigestion/heartburn?: No Diarrhea?: No Constipation?: No  Constitutional Fever: No Night sweats?: No Weight loss?: No Fatigue?: No  Skin Skin rash/lesions?: No Itching?: No  Eyes Blurred vision?: No Double vision?: No  Ears/Nose/Throat Sore throat?: No Sinus problems?: No  Hematologic/Lymphatic Swollen glands?: No Easy bruising?: No  Cardiovascular Leg  swelling?: No Chest pain?: No  Respiratory Cough?: No Shortness of breath?: No  Endocrine Excessive thirst?: No  Musculoskeletal Back pain?: No Joint pain?: No  Neurological Headaches?: No Dizziness?: No  Psychologic Depression?: No Anxiety?: No  Physical Exam: BP (!) 161/94   Pulse 83   Ht 6' (1.829 m)   Wt 207 lb 3.2 oz (94 kg)   BMI 28.10 kg/m   Constitutional:  Alert and oriented, No acute distress. HEENT: [place] AT, moist mucus membranes.  Trachea midline, no masses. Cardiovascular: No clubbing, cyanosis, or edema. Respiratory: Normal respiratory effort, no increased work of breathing. GI: Abdomen is soft, nontender, nondistended, no abdominal masses GU: No CVA tenderness.  Skin: No rashes, bruises or suspicious lesions. Lymph: No cervical or inguinal adenopathy. Neurologic: Grossly intact, no focal deficits, moving all 4 extremities. Psychiatric: Normal mood and affect.  Laboratory Data: No results found for: WBC, HGB, HCT, MCV, PLT  No results found for: CREATININE  No results found for: PSA  No results found for: TESTOSTERONE  No results found for: HGBA1C  Urinalysis    Component Value Date/Time   APPEARANCEUR Clear 06/19/2015 1443   GLUCOSEU 2+ (A) 06/19/2015 1443   BILIRUBINUR Negative 06/19/2015 1443   PROTEINUR Negative 06/19/2015 1443   NITRITE Negative 06/19/2015 1443   LEUKOCYTESUR Negative 06/19/2015 1443     Assessment & Plan:    1 - Microscopic Hematuria - Reinforced need for repeat eval for future gross / visible blood only, but that microscopic blood may persist. Can now follow up for this prn  2 - Enlarged Prostate - would consider adding daily finasteride based on this and above should he have future bother. However, no symptoms currently. No treatment necessary  3 - RTC prn  No Follow-up on file.  Nickie Retort, MD  [facility] [address] [place], [place] [address] [phone]

## 2016-07-01 ENCOUNTER — Telehealth: Payer: Self-pay | Admitting: *Deleted

## 2016-07-01 ENCOUNTER — Other Ambulatory Visit: Payer: Self-pay | Admitting: Podiatry

## 2016-07-01 MED ORDER — GABAPENTIN 300 MG PO CAPS: 300.0000 mg | ORAL_CAPSULE | Freq: Three times a day (TID) | ORAL | 3 refills | Status: DC

## 2016-07-01 NOTE — Telephone Encounter (Signed)
CONTINUATION of previous 07/01/2106 phone message Gabapentin. Orders sent to [facility].

## 2016-07-01 NOTE — Telephone Encounter (Signed)
Pt's wife, June states pt needs his Gabapentin refilled. I told pt I did not see in Medication listing, that Dr. Milinda Pointer had  Ordered. June said the Gabapentin bottle states Dr. Bennie Pierini T. Hyatt prescribed and is Gabapentin 300mg  3 times a day dispense #270. I reviewed all of the chart notes and 08/17/2015 under Medication Changes Dr. Milinda Pointer ordered Gabapentin 300mg  one capsule 3 times a day. I told June I would refill as ordered 08/16/2016 for one year as standard procedure for Dr. Milinda Pointer on a long term

## 2016-07-02 ENCOUNTER — Other Ambulatory Visit: Payer: Self-pay | Admitting: Podiatry

## 2016-07-13 ENCOUNTER — Ambulatory Visit (INDEPENDENT_AMBULATORY_CARE_PROVIDER_SITE_OTHER): Payer: Medicare Other | Admitting: Podiatry

## 2016-07-13 ENCOUNTER — Encounter: Payer: Self-pay | Admitting: Podiatry

## 2016-07-13 DIAGNOSIS — M2140 Flat foot [pes planus] (acquired), unspecified foot: Secondary | ICD-10-CM

## 2016-07-13 DIAGNOSIS — M76829 Posterior tibial tendinitis, unspecified leg: Secondary | ICD-10-CM

## 2016-07-13 NOTE — Progress Notes (Signed)
He presents today with his wife for surgical consult regarding his right foot. They both state that it seems to be getting worse as his foot is starting to flatten down and is beginning to walk on the medial aspect of his ankle. He has a physical exam and blood work scheduled for tomorrow and next week. He states that his foot still hurts.  Objective: I have reviewed his past medical history medications allergies surgeries and social history. Vital signs are stable he is alert and oriented 3. He states that his diabetes is under good control. Pulses are strongly palpable. Neurologic sensorium is intact. Deep tendon reflexes are intact. Muscle strength is normal. He has pain on palpation of the posterior tibial tendon as it courses beneath the medial malleolus extending to the navicular tuberosity. According to the MRI there is a significant tear with fraying in this area.  Assessment: Posterior tibial tendon tear right.  Plan: We performed a consult with a consent today for repair of posterior tibial tendon via a Kidner procedure and primary repair of posterior tibial tendon right foot. They understand this and are amenable to it. We did discuss the possible postop complications which may include but are not limited to postop pain bleeding swelling infection recurrence need further surgery over correction under correction. I also will apply cast. He understands that because of his diabetes he runs a risk of this not healing well particularly as his sugar increases. We will get medical clearance from his primary doctor.

## 2016-08-01 ENCOUNTER — Telehealth: Payer: Self-pay | Admitting: *Deleted

## 2016-08-01 NOTE — Telephone Encounter (Signed)
"  I'm calling to schedule surgery for my husband, Jeremy Valencia."  Has he signed consent forms?  "Yes, he has and we were told to call you to schedule."  Do you have a date in mind?  "We would like to do it on December 29."  That date is not available.  He can do it on December 28 in the afternoon.  "That is late he is Diabetic and will need an early morning appointment."  He is going to have to do it in January then because other dates are not available.  "That was my concern.  How about January 5?"  That date is available.  "What time will his surgery be?"  It will be sometime that morning, I can't give you an exact time.  Someone from the surgical center normally calls a day or two prior to date with the arrival time.  I will let them know he is Diabetic.  You can go ahead and register with the surgical center, instructions are in the brochure from the surgery center.  "Can you call if there are any cancellations in December?"  Yes, I will put him on the waiting list.

## 2016-09-07 ENCOUNTER — Other Ambulatory Visit: Payer: Self-pay | Admitting: Podiatry

## 2016-09-07 MED ORDER — ONDANSETRON HCL 4 MG PO TABS: 4.0000 mg | ORAL_TABLET | Freq: Three times a day (TID) | ORAL | 0 refills | Status: DC | PRN

## 2016-09-07 MED ORDER — HYDROMORPHONE HCL 4 MG PO TABS: 4.0000 mg | ORAL_TABLET | ORAL | 0 refills | Status: DC | PRN

## 2016-09-07 MED ORDER — CLINDAMYCIN HCL 150 MG PO CAPS: 150.0000 mg | ORAL_CAPSULE | Freq: Three times a day (TID) | ORAL | 0 refills | Status: DC

## 2016-09-08 ENCOUNTER — Telehealth: Payer: Self-pay | Admitting: *Deleted

## 2016-09-08 NOTE — Telephone Encounter (Signed)
"  I received a call from the surgical center that said you wanted to move your surgery from tomorrow to January 12.  Dr. Milinda Pointer does not have anything available on January 12, he can do it on January 19 or 26.  "I can't do it on January 19, let's do it on January 26."  I will get it rescheduled.  I will let Dr. Milinda Pointer know.  I left Caren Griffins at [facility] a message that I rescheduled surgery to 09/30/2016.

## 2016-09-08 NOTE — Telephone Encounter (Signed)
"  I have a dilemma.  I am scheduled for surgery on tomorrow morning.  [place] of [place] that they said would not get a lot of bad weather."  You will need to contact the surgical center.  "Okay, what's their number?"  Their number is [phone].

## 2016-09-14 ENCOUNTER — Encounter: Payer: Medicare Other | Admitting: Podiatry

## 2016-09-21 ENCOUNTER — Encounter: Payer: Medicare Other | Admitting: Podiatry

## 2016-09-29 ENCOUNTER — Other Ambulatory Visit: Payer: Self-pay | Admitting: Podiatry

## 2016-09-29 MED ORDER — HYDROMORPHONE HCL 4 MG PO TABS: 4.0000 mg | ORAL_TABLET | ORAL | 0 refills | Status: DC | PRN

## 2016-09-29 MED ORDER — CEPHALEXIN 500 MG PO CAPS: 500.0000 mg | ORAL_CAPSULE | Freq: Three times a day (TID) | ORAL | 0 refills | Status: DC

## 2016-09-29 MED ORDER — ONDANSETRON HCL 4 MG PO TABS: 4.0000 mg | ORAL_TABLET | Freq: Three times a day (TID) | ORAL | 0 refills | Status: DC | PRN

## 2016-09-30 ENCOUNTER — Encounter: Payer: Self-pay | Admitting: Podiatry

## 2016-09-30 DIAGNOSIS — M6789 Other specified disorders of synovium and tendon, multiple sites: Secondary | ICD-10-CM | POA: Diagnosis not present

## 2016-10-02 ENCOUNTER — Telehealth: Payer: Self-pay | Admitting: Sports Medicine

## 2016-10-02 NOTE — Telephone Encounter (Signed)
Daughter called stating that dad had surgery this week and mom noted that he had a fever of 102 and was given Advil PM with fever now of 98. I advised daughter to have her mom to check her dad's temperature each hour and th check for other consitutional symptoms, if present go to ER since he has a history of UTI. Continue with keflex, ice, and elevation and close monitoring of post op site; Daughter expresses understanding. -Dr. Cannon Kettle

## 2016-10-04 ENCOUNTER — Telehealth: Payer: Self-pay

## 2016-10-04 NOTE — Telephone Encounter (Signed)
LVM regarding pt post op status

## 2016-10-05 ENCOUNTER — Encounter: Payer: Self-pay | Admitting: Podiatry

## 2016-10-05 ENCOUNTER — Ambulatory Visit (INDEPENDENT_AMBULATORY_CARE_PROVIDER_SITE_OTHER): Payer: Medicare Other

## 2016-10-05 ENCOUNTER — Ambulatory Visit (INDEPENDENT_AMBULATORY_CARE_PROVIDER_SITE_OTHER): Payer: Self-pay | Admitting: Podiatry

## 2016-10-05 VITALS — BP 136/82 | HR 89 | Temp 98.9°F | Resp 16

## 2016-10-05 DIAGNOSIS — M76829 Posterior tibial tendinitis, unspecified leg: Secondary | ICD-10-CM

## 2016-10-05 DIAGNOSIS — M6789 Other specified disorders of synovium and tendon, multiple sites: Secondary | ICD-10-CM | POA: Diagnosis not present

## 2016-10-05 DIAGNOSIS — Z9889 Other specified postprocedural states: Secondary | ICD-10-CM

## 2016-10-05 DIAGNOSIS — M2140 Flat foot [pes planus] (acquired), unspecified foot: Secondary | ICD-10-CM

## 2016-10-05 NOTE — Progress Notes (Signed)
He presents today status post one week Kidner procedure with cast right foot. He presents today with his son using a knee scooter.  Objective: Vital signs are stable alert and oriented 3. Pulses are palpable. Sensation to the toes are normal he has mild edema about the toes but loosening of the cast around his leg. Radius taken today demonstrate a anchor in the navicular bone. Staples are intact. He has good range of motion of the toes.  Assessment: Status post 1 week Kidner procedure with posterior tibial tendon repair.  Plan: He is to continue to ice and elevate this continue to take his aspirin. Continue to monitor his blood pressure and his body temperature. I will follow-up with him in 1 week.

## 2016-10-11 NOTE — Progress Notes (Signed)
DOS 01.26.2018 Kidner procedure with repair posterior tibial tendon tear right. Cast application right.

## 2016-10-12 ENCOUNTER — Ambulatory Visit (INDEPENDENT_AMBULATORY_CARE_PROVIDER_SITE_OTHER): Payer: Medicare Other | Admitting: Podiatry

## 2016-10-12 DIAGNOSIS — M76829 Posterior tibial tendinitis, unspecified leg: Secondary | ICD-10-CM

## 2016-10-12 DIAGNOSIS — M2140 Flat foot [pes planus] (acquired), unspecified foot: Secondary | ICD-10-CM

## 2016-10-12 DIAGNOSIS — Z9889 Other specified postprocedural states: Secondary | ICD-10-CM

## 2016-10-12 NOTE — Progress Notes (Signed)
He presents today nearly 2 weeks status post repair of posterior tibial tendon right foot with cast. Plantar aspect of the cast appears to be clean he denies chest pain shortness of breath. Presents with his wife on his knee scooter.  Objective: Cast intact was removed demonstrates sutures are intact margins well coapted no open lesions or wounds are noted. Nontender around the surgical site are removed half of the staples that were present.  Assessment: Well-healing surgical foot 2 weeks.  Plan: Redressed today dressed or compressive dressing after removing portion of the staples. We recasted him. He will remain nonweightbearing I will follow-up with him in 2 weeks at which time the cast will be removed.

## 2016-10-26 ENCOUNTER — Ambulatory Visit (INDEPENDENT_AMBULATORY_CARE_PROVIDER_SITE_OTHER): Payer: Medicare Other | Admitting: Podiatry

## 2016-10-26 ENCOUNTER — Encounter: Payer: Self-pay | Admitting: Podiatry

## 2016-10-26 ENCOUNTER — Ambulatory Visit (INDEPENDENT_AMBULATORY_CARE_PROVIDER_SITE_OTHER): Payer: Medicare Other

## 2016-10-26 DIAGNOSIS — M6789 Other specified disorders of synovium and tendon, multiple sites: Secondary | ICD-10-CM | POA: Diagnosis not present

## 2016-10-26 DIAGNOSIS — M76829 Posterior tibial tendinitis, unspecified leg: Secondary | ICD-10-CM

## 2016-10-26 DIAGNOSIS — Z9889 Other specified postprocedural states: Secondary | ICD-10-CM

## 2016-10-26 DIAGNOSIS — M2140 Flat foot [pes planus] (acquired), unspecified foot: Secondary | ICD-10-CM | POA: Diagnosis not present

## 2016-10-26 NOTE — Progress Notes (Signed)
He presents today for a third cast change. Date of surgery January 6 26 2018 status post posterior tibial tendon repair with Kidner advancement. He states that it is doing very well and his wife confirms this.  Objective: Vital signs are stable alert and oriented 3 cast is intact once removed demonstrates no erythema just mild edema no cellulitis drainage or odor he has pain-free range of motion and palpation. I removed nearly all of the sutures and staples today leaving 3 staples distally and an area that appears to be slightly questionable as to its coaption.  Assessment well-healing surgical foot right.  Plan: I encouraged him to move the foot around before we placed it in another cast. We did place this in a cast after another dry sterile compressive dressing was applied. He will continue nonweightbearing status and I will follow-up with him in 2 weeks at which time we will remove the remainder staples and get him into a Cam Walker. We will dispense the Cam Walker at that time he does not have one. He will call with questions or concerns. He will continue to take his aspirin daily

## 2016-11-09 ENCOUNTER — Ambulatory Visit (INDEPENDENT_AMBULATORY_CARE_PROVIDER_SITE_OTHER): Payer: Medicare Other | Admitting: Podiatry

## 2016-11-09 DIAGNOSIS — M2140 Flat foot [pes planus] (acquired), unspecified foot: Secondary | ICD-10-CM | POA: Diagnosis not present

## 2016-11-09 DIAGNOSIS — Z9889 Other specified postprocedural states: Secondary | ICD-10-CM

## 2016-11-09 DIAGNOSIS — M76829 Posterior tibial tendinitis, unspecified leg: Secondary | ICD-10-CM

## 2016-11-09 NOTE — Progress Notes (Signed)
He presents today for follow-up of his posterior tibial tendon repair date of surgery 09/30/2016. He states in the cast has been somewhat tight but he really hasn't had a lot of pain.  Objective: Vital signs are stable alert and oriented 3. Pulses are palpable. Cast was removed that Jeremy Valencia. is no erythema is mild edema to the forefoot surgical site appears to be completely healed removed the remainder of the staples distal most aspect of the incision which appears to be well coapted. I see no signs of infection he has good range of motion without pain is good inversion against resistance.  Assessment: Well-healing surgical foot right.  Plan: Placed him in a compression anklet today and a Cam Walker. He will continue to be nonweight bearing however I will allow him to wash this foot and leg and do some simple exercises. I will follow-up with him in 2 weeks at which time we will progress to partial weightbearing.

## 2016-11-23 ENCOUNTER — Ambulatory Visit (INDEPENDENT_AMBULATORY_CARE_PROVIDER_SITE_OTHER): Payer: Medicare Other

## 2016-11-23 ENCOUNTER — Encounter: Payer: Self-pay | Admitting: Podiatry

## 2016-11-23 ENCOUNTER — Ambulatory Visit (INDEPENDENT_AMBULATORY_CARE_PROVIDER_SITE_OTHER): Payer: Self-pay | Admitting: Podiatry

## 2016-11-23 DIAGNOSIS — M79671 Pain in right foot: Secondary | ICD-10-CM

## 2016-11-23 DIAGNOSIS — M2140 Flat foot [pes planus] (acquired), unspecified foot: Secondary | ICD-10-CM | POA: Diagnosis not present

## 2016-11-23 DIAGNOSIS — M76829 Posterior tibial tendinitis, unspecified leg: Secondary | ICD-10-CM

## 2016-11-23 NOTE — Progress Notes (Signed)
He presents today states that his foot is doing very well he is status post a procedure with repair posterior tibial tendon 09/30/2016. He denies shortness of breath chest pain Pain.  Objective: Vital signs are stable alert and oriented 3. Pulses are palpable. Neurologic sensory is intact. Degenerative flexor intact. He has minimal edema about the surgical site which appears to be going on to heal uneventfully. He has good range of motion with inversion against resistance.  Assessment: Well-healing surgical foot right.  Plan: Follow up with him in 3 weeks. I will allow him to start partial weightbearing and proceed full weightbearing over the next 3 weeks. Next visit we will place him in a Brace.

## 2016-12-14 ENCOUNTER — Encounter: Payer: Self-pay | Admitting: Podiatry

## 2016-12-14 ENCOUNTER — Ambulatory Visit (INDEPENDENT_AMBULATORY_CARE_PROVIDER_SITE_OTHER): Payer: Medicare Other

## 2016-12-14 ENCOUNTER — Ambulatory Visit (INDEPENDENT_AMBULATORY_CARE_PROVIDER_SITE_OTHER): Payer: Medicare Other | Admitting: Podiatry

## 2016-12-14 DIAGNOSIS — M2140 Flat foot [pes planus] (acquired), unspecified foot: Secondary | ICD-10-CM | POA: Diagnosis not present

## 2016-12-14 DIAGNOSIS — M76829 Posterior tibial tendinitis, unspecified leg: Secondary | ICD-10-CM

## 2016-12-14 NOTE — Progress Notes (Signed)
He presents today with his Cam Walker status post posterior tibial tendon repair and Kidner procedure. He states that he is doing well he has some stiffness but really just doesn't hurt.  Objective: Vital signs are stable he's alert and oriented 3 pulses are palpable right foot. No open wounds or lesions are noted. He has good inversion against resistance with palpable tendon that appears to be normal in diameter.  Radiographs taken today demonstrate Arthrex anchor intact to the navicular. Soft tissue edema is decreasing and soft tissue margins are becoming delineated.  Assessment: 1 nonsurgical foot right. Date of surgery 09/30/2016.  Plan: I would allow him to walk with that just the Cam Walker for the next week. He is then transition from the [employer] to the Tri-Lock brace and tennis shoe I will follow-up with him in 3 weeks.

## 2017-01-04 ENCOUNTER — Ambulatory Visit (INDEPENDENT_AMBULATORY_CARE_PROVIDER_SITE_OTHER): Payer: Medicare Other

## 2017-01-04 ENCOUNTER — Encounter: Payer: Self-pay | Admitting: Podiatry

## 2017-01-04 ENCOUNTER — Ambulatory Visit (INDEPENDENT_AMBULATORY_CARE_PROVIDER_SITE_OTHER): Payer: Medicare Other | Admitting: Podiatry

## 2017-01-04 DIAGNOSIS — M2140 Flat foot [pes planus] (acquired), unspecified foot: Secondary | ICD-10-CM

## 2017-01-04 DIAGNOSIS — M76829 Posterior tibial tendinitis, unspecified leg: Secondary | ICD-10-CM

## 2017-01-05 NOTE — Progress Notes (Signed)
He presents today for postop visit status post Kidner procedure with repair posterior tibial tendon date of surgery 09/24/2016. He states it is doing much better. Continues to wear his tri-lock brace and regular tennis shoe.  Objective: Vital signs are stable he is alert and oriented 3 months decrease in edema incision site is going on to heal uneventfully his posterior tibial tendon is palpable without fluctuance. His due to inversion against resistance and arch is maintained.  Assessment: Well-healed surgical foot right.  Plan: Asked him to continue current therapies utilizing a Tri-Lock brace and the tennis shoe we will consider physical therapy at the next visit.

## 2017-02-06 ENCOUNTER — Ambulatory Visit (INDEPENDENT_AMBULATORY_CARE_PROVIDER_SITE_OTHER): Payer: Medicare Other | Admitting: Podiatry

## 2017-02-06 ENCOUNTER — Encounter: Payer: Self-pay | Admitting: Podiatry

## 2017-02-06 DIAGNOSIS — M7671 Peroneal tendinitis, right leg: Secondary | ICD-10-CM

## 2017-02-06 DIAGNOSIS — M2140 Flat foot [pes planus] (acquired), unspecified foot: Secondary | ICD-10-CM | POA: Diagnosis not present

## 2017-02-06 DIAGNOSIS — M76829 Posterior tibial tendinitis, unspecified leg: Secondary | ICD-10-CM

## 2017-02-06 NOTE — Progress Notes (Signed)
He presents today for follow-up of his posterior tibial tendon and Kidner advancement procedure right foot. Date of surgery 09/29/2016. He states this really doing very well.  Objective: Vital signs are stable he is alert and oriented 3 he does have some tenderness of the peroneal tendons of the right foot. He has no pain on palpation and inversion against resistance of the posterior tibial tendon.  Assessment: Posterior tibial tendinitis has resolved nearly 100% after surgery. He does have some mild peroneal tendinitis more than likely associated compensation.  Plan: I injected the area today with dexamethasone and local anesthetic laterally. I will follow-up with him in about 2 months.

## 2017-04-10 ENCOUNTER — Ambulatory Visit (INDEPENDENT_AMBULATORY_CARE_PROVIDER_SITE_OTHER): Payer: Medicare Other | Admitting: Podiatry

## 2017-04-10 ENCOUNTER — Encounter: Payer: Self-pay | Admitting: Podiatry

## 2017-04-10 DIAGNOSIS — M7671 Peroneal tendinitis, right leg: Secondary | ICD-10-CM

## 2017-04-10 NOTE — Progress Notes (Signed)
He presents today for follow-up of his right foot. 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 He states that he is doing very well. The last time he was in we injected his right peroneal tendons with Kenalog and local anesthetic which she states is 100% better at this point.  Objective: Evaluation of the right foot demonstrates no erythema edema cellulitis drainage or odor. Still has a strong front functional posterior tibial tendon as well as peroneal tendons. No lesions or wounds are noted to this diabetic foot.  Assessment: Well-healing surgical foot and peroneal tendinitis right.  Plan: Follow up with him in 6 months for his diabetic check.

## 2017-07-26 DIAGNOSIS — E1342 Other specified diabetes mellitus with diabetic polyneuropathy: Secondary | ICD-10-CM | POA: Insufficient documentation

## 2017-08-26 ENCOUNTER — Other Ambulatory Visit: Payer: Self-pay | Admitting: Podiatry

## 2017-10-04 ENCOUNTER — Ambulatory Visit (INDEPENDENT_AMBULATORY_CARE_PROVIDER_SITE_OTHER): Payer: Medicare Other | Admitting: Podiatry

## 2017-10-04 ENCOUNTER — Encounter: Payer: Self-pay | Admitting: Podiatry

## 2017-10-04 DIAGNOSIS — L02612 Cutaneous abscess of left foot: Secondary | ICD-10-CM

## 2017-10-04 DIAGNOSIS — L03032 Cellulitis of left toe: Secondary | ICD-10-CM | POA: Diagnosis not present

## 2017-10-04 MED ORDER — DOXYCYCLINE HYCLATE 100 MG PO TABS: 100.0000 mg | ORAL_TABLET | Freq: Two times a day (BID) | ORAL | 0 refills | Status: DC

## 2017-10-04 MED ORDER — MUPIROCIN 2 % EX OINT: TOPICAL_OINTMENT | CUTANEOUS | 1 refills | Status: DC

## 2017-10-04 NOTE — Progress Notes (Signed)
He presents with pain to the right hallux today and redness ascending up his right foot.  Has history of diabetes.  States that recently he noticed itching on the top of his toe so he started scratching.  He states after a few days of scratching it turned red and blistered and now have all this redness.  He denies fever chills nausea vomiting muscle aches and pains.  Objective: Vital signs are stable he is alert and oriented x3.  He has strong palpable pulses to the right lower extremity with cellulitis extending across the dorsal aspect of his foot toward his ankle.  He has small blisters and excoriations on the dorsal aspect of the toe consistent with irritation and possible contact dermatitis.  Assessment: Cellulitis contact dermatitis right foot.  Plan: Discussed etiology pathology conservative versus surgical therapies.  Performed a dressing to the toe with Silvadene.  Wrote a prescription for doxycycline twice daily and also started him on mupirocin ointment.  He will watch for signs and symptoms of worsening infection fever chills nausea vomiting muscle aches and pains and will immediately go to the emergency department.  Otherwise I will follow-up with him in 1 week.  Placed in a [employer] shoe.

## 2017-10-09 ENCOUNTER — Ambulatory Visit: Payer: Medicare Other | Admitting: Podiatry

## 2017-10-11 ENCOUNTER — Ambulatory Visit: Payer: Medicare Other | Admitting: Podiatry

## 2017-10-11 ENCOUNTER — Ambulatory Visit (INDEPENDENT_AMBULATORY_CARE_PROVIDER_SITE_OTHER): Payer: Medicare Other | Admitting: Podiatry

## 2017-10-11 ENCOUNTER — Encounter: Payer: Self-pay | Admitting: Podiatry

## 2017-10-11 DIAGNOSIS — L02612 Cutaneous abscess of left foot: Secondary | ICD-10-CM | POA: Diagnosis not present

## 2017-10-11 DIAGNOSIS — L03032 Cellulitis of left toe: Secondary | ICD-10-CM | POA: Diagnosis not present

## 2017-10-11 NOTE — Progress Notes (Signed)
He presents today for follow-up of his cellulitis dorsal aspect right foot.  He states that is doing much better he continues to take his antibiotics for which she still has a few left at home.  He states that he soaks his foot every day or every other day at least Epsom salts and warm water.  Denies fever chills nausea vomiting muscle aches and pains.  Objective: Vital signs are stable alert and oriented x3.  Pulses are palpable.  Neurologic sensorium is intact.  Deep tendon reflexes are intact muscle strength is normal cellulitis has diminished to just redness on the dorsal aspect of the toe and the blisters appear to be drying out.  Contralateral foot does demonstrate a small interspace infection with some superficial cellulitis fourth interdigital space left foot.  I lanced the blister there today drained it and he removed it he will start soaking this foot as well.  Assessment: Well-healing cellulitis right foot early cellulitis left foot.  Plan: Continue to soak both feet Epsom salts and warm water I applied Castellani's paint to both sites for drying.  He will continue his antibiotics and I will follow-up with him next week.

## 2017-10-13 ENCOUNTER — Encounter: Payer: Self-pay | Admitting: Podiatry

## 2017-10-13 ENCOUNTER — Ambulatory Visit (INDEPENDENT_AMBULATORY_CARE_PROVIDER_SITE_OTHER): Payer: Medicare Other | Admitting: Podiatry

## 2017-10-13 DIAGNOSIS — L03116 Cellulitis of left lower limb: Secondary | ICD-10-CM | POA: Diagnosis not present

## 2017-10-13 MED ORDER — DOXYCYCLINE HYCLATE 100 MG PO TABS: 100.0000 mg | ORAL_TABLET | Freq: Two times a day (BID) | ORAL | 0 refills | Status: DC

## 2017-10-13 NOTE — Progress Notes (Signed)
   HPI: 77 year old male presents to the office today for evaluation of a new complaint regarding some left foot rash that developed over the past 2 days.  Patient is currently being treated by Dr. Milinda Pointer for cellulitis to the right foot and he is currently taking oral doxycycline.  He states that he finishes his antibiotic today.  He noticed over the last 2 days that the rash developed to his left foot with some mild edema.  He presents today for further treatment and evaluation  Past Medical History:  Diagnosis Date  . Bursitis 05/16/2013   BURSITIS/CAPSULITIS RT ANKLE  . Cancer ([place])    skin cancer  . 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 Diabetes mellitus without complication ([place])   . Hypertension   . Plantar fasciitis      Physical Exam: General: The patient is alert and oriented x3 in no acute distress.  Dermatology: Skin is warm, dry and supple bilateral lower extremities. Negative for open lesions or macerations to the left lower extremity.  There does appear to be a reactive type dermatitis possibly due to allergic reaction to the antibiotic, not likely cellulitis.  Vascular: Palpable pedal pulses bilaterally.  There does appear to be some minimal to moderate edema to the left foot.  Capillary refill within normal limits.  Neurological: Epicritic and protective threshold grossly intact bilaterally.   Musculoskeletal Exam: Range of motion within normal limits to all pedal and ankle joints bilateral. Muscle strength 5/5 in all groups bilateral.   Assessment: -Cellulitis right foot-resolved -Reactive dermatitis left foot with mild edema   Plan of Care:  -Patient was evaluated today. -Today we are going to refill the patient's prescription for doxycycline and recommend topical hydrocortisone 1% cream to the left foot dermatitis lesions. -Patient has an appointment on Wednesday2/13/2019 with Dr. Milinda Pointer.  Follow-up in the office on next  scheduled appointment. Edrick Kins, DPM [facility]  Dr. Edrick Kins, DPM    [address], [place] [address]                Office [phone]  Fax [phone]

## 2017-10-18 ENCOUNTER — Ambulatory Visit: Payer: Medicare Other | Admitting: Podiatry

## 2017-10-20 ENCOUNTER — Encounter: Payer: Self-pay | Admitting: Podiatry

## 2017-10-20 ENCOUNTER — Ambulatory Visit (INDEPENDENT_AMBULATORY_CARE_PROVIDER_SITE_OTHER): Payer: Medicare Other | Admitting: Podiatry

## 2017-10-20 DIAGNOSIS — L03116 Cellulitis of left lower limb: Secondary | ICD-10-CM

## 2017-10-23 NOTE — Progress Notes (Signed)
   HPI: 77 year old male presenting today for follow up evaluation left foot dermatitis and right foot cellulitis. He states his condition has improved. There are no modifying factors noted. He denies any new complaints at this time. Patient is here for further evaluation and treatment.   Past Medical History:  Diagnosis Date  . Bursitis 05/16/2013   BURSITIS/CAPSULITIS RT ANKLE  . Cancer ([place])    skin cancer  . 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 Diabetes mellitus without complication ([place])   . Hypertension   . Plantar fasciitis      Physical Exam: General: The patient is alert and oriented x3 in no acute distress.  Dermatology: Skin is warm, dry and supple bilateral lower extremities. Negative for open lesions or macerations.  Vascular: Palpable pedal pulses bilaterally. No edema or erythema noted. Capillary refill within normal limits.  Neurological: Epicritic and protective threshold grossly intact bilaterally.   Musculoskeletal Exam: Range of motion within normal limits to all pedal and ankle joints bilateral. Muscle strength 5/5 in all groups bilateral.   Assessment: - left foot dermatitis - resolved - right foot cellulitis - resolved  Plan of Care:  - Patient evaluated.  - Recommended betadine or Castellani paint between the toes. - Resume wearing normal shoe gear.  - Return to clinic as needed.    Edrick Kins, DPM [facility]  Dr. Edrick Kins, DPM    [address], [place] [address]                Office [phone]  Fax [phone]

## 2017-10-26 ENCOUNTER — Encounter: Payer: Self-pay | Admitting: Emergency Medicine

## 2017-10-26 ENCOUNTER — Emergency Department: Payer: Medicare Other

## 2017-10-26 ENCOUNTER — Other Ambulatory Visit: Payer: Self-pay

## 2017-10-26 ENCOUNTER — Inpatient Hospital Stay
Admission: EM | Admit: 2017-10-26 | Discharge: 2017-10-28 | DRG: 153 | Disposition: A | Discharge status: Home / Self Care | Payer: Medicare Other | Attending: Internal Medicine | Admitting: Internal Medicine

## 2017-10-26 DIAGNOSIS — J069 Acute upper respiratory infection, unspecified: Principal | ICD-10-CM | POA: Diagnosis present

## 2017-10-26 DIAGNOSIS — Z79891 Long term (current) use of opiate analgesic: Secondary | ICD-10-CM

## 2017-10-26 DIAGNOSIS — Z79899 Other long term (current) drug therapy: Secondary | ICD-10-CM

## 2017-10-26 DIAGNOSIS — R4182 Altered mental status, unspecified: Secondary | ICD-10-CM

## 2017-10-26 DIAGNOSIS — E785 Hyperlipidemia, unspecified: Secondary | ICD-10-CM | POA: Diagnosis present

## 2017-10-26 DIAGNOSIS — R509 Fever, unspecified: Secondary | ICD-10-CM | POA: Diagnosis present

## 2017-10-26 DIAGNOSIS — Z7982 Long term (current) use of aspirin: Secondary | ICD-10-CM

## 2017-10-26 DIAGNOSIS — Z87891 Personal history of nicotine dependence: Secondary | ICD-10-CM

## 2017-10-26 DIAGNOSIS — Z888 Allergy status to other drugs, medicaments and biological substances status: Secondary | ICD-10-CM | POA: Diagnosis not present

## 2017-10-26 DIAGNOSIS — R059 Cough, unspecified: Secondary | ICD-10-CM

## 2017-10-26 DIAGNOSIS — R911 Solitary pulmonary nodule: Secondary | ICD-10-CM | POA: Diagnosis present

## 2017-10-26 DIAGNOSIS — E119 Type 2 diabetes mellitus without complications: Secondary | ICD-10-CM | POA: Diagnosis present

## 2017-10-26 DIAGNOSIS — E872 Acidosis: Secondary | ICD-10-CM | POA: Diagnosis present

## 2017-10-26 DIAGNOSIS — R05 Cough: Secondary | ICD-10-CM

## 2017-10-26 DIAGNOSIS — I10 Essential (primary) hypertension: Secondary | ICD-10-CM | POA: Diagnosis present

## 2017-10-26 DIAGNOSIS — R6510 Systemic inflammatory response syndrome (SIRS) of non-infectious origin without acute organ dysfunction: Secondary | ICD-10-CM

## 2017-10-26 DIAGNOSIS — Z85828 Personal history of other malignant neoplasm of skin: Secondary | ICD-10-CM | POA: Diagnosis not present

## 2017-10-26 DIAGNOSIS — Z7984 Long term (current) use of oral hypoglycemic drugs: Secondary | ICD-10-CM | POA: Diagnosis not present

## 2017-10-26 DIAGNOSIS — R531 Weakness: Secondary | ICD-10-CM

## 2017-10-26 DIAGNOSIS — E860 Dehydration: Secondary | ICD-10-CM | POA: Diagnosis present

## 2017-10-26 DIAGNOSIS — J449 Chronic obstructive pulmonary disease, unspecified: Secondary | ICD-10-CM | POA: Diagnosis present

## 2017-10-26 LAB — TROPONIN I: Troponin I: <0.03 ng/mL (ref <0.03)

## 2017-10-26 LAB — CBC WITH DIFFERENTIAL/PLATELET
BASOS ABS: 0.1 10*3/uL (ref 0–0.1)
BASOS PCT: 0 %
Eosinophils Absolute: 0 10*3/uL (ref 0–0.7)
Eosinophils Relative: 0 %
HEMATOCRIT: 42.8 % (ref 40.0–52.0)
HEMOGLOBIN: 14.3 g/dL (ref 13.0–18.0)
Lymphocytes Relative: 5 %
Lymphs Abs: 0.9 10*3/uL — ABNORMAL LOW (ref 1.0–3.6)
MCH: 32.2 pg (ref 26.0–34.0)
MCHC: 33.3 g/dL (ref 32.0–36.0)
MCV: 96.6 fL (ref 80.0–100.0)
Monocytes Absolute: 1.9 10*3/uL — ABNORMAL HIGH (ref 0.2–1.0)
Monocytes Relative: 10 %
NEUTROS ABS: 16.1 10*3/uL — AB (ref 1.4–6.5)
Neutrophils Relative %: 85 %
Platelets: 233 10*3/uL (ref 150–440)
RBC: 4.44 MIL/uL (ref 4.40–5.90)
RDW: 12.8 % (ref 11.5–14.5)
WBC: 19 10*3/uL — ABNORMAL HIGH (ref 3.8–10.6)

## 2017-10-26 LAB — URINALYSIS, COMPLETE (UACMP) WITH MICROSCOPIC
BACTERIA UA: NONE SEEN
Bilirubin Urine: NEGATIVE
Glucose, UA: 150 mg/dL — AB
HGB URINE DIPSTICK: NEGATIVE
Ketones, ur: 20 mg/dL — AB
LEUKOCYTES UA: NEGATIVE
NITRITE: NEGATIVE
PROTEIN: 100 mg/dL — AB
Specific Gravity, Urine: 1.027 (ref 1.005–1.030)
pH: 5 (ref 5.0–8.0)

## 2017-10-26 LAB — INFLUENZA PANEL BY PCR (TYPE A & B)
INFLAPCR: NEGATIVE
Influenza B By PCR: NEGATIVE

## 2017-10-26 LAB — COMPREHENSIVE METABOLIC PANEL
ALK PHOS: 40 U/L (ref 38–126)
ALT: 20 U/L (ref 17–63)
ANION GAP: 10 (ref 5–15)
AST: 25 U/L (ref 15–41)
Albumin: 3.6 g/dL (ref 3.5–5.0)
BILIRUBIN TOTAL: 1.4 mg/dL — AB (ref 0.3–1.2)
BUN: 18 mg/dL (ref 6–20)
CALCIUM: 8.5 mg/dL — AB (ref 8.9–10.3)
CO2: 23 mmol/L (ref 22–32)
Chloride: 104 mmol/L (ref 101–111)
Creatinine, Ser: 1.02 mg/dL (ref 0.61–1.24)
GFR calc non Af Amer: >60 mL/min (ref >60)
Glucose, Bld: 195 mg/dL — ABNORMAL HIGH (ref 65–99)
Potassium: 3.8 mmol/L (ref 3.5–5.1)
SODIUM: 137 mmol/L (ref 135–145)
TOTAL PROTEIN: 6.8 g/dL (ref 6.5–8.1)

## 2017-10-26 LAB — GLUCOSE, CAPILLARY
GLUCOSE-CAPILLARY: 170 mg/dL — AB (ref 65–99)
Glucose-Capillary: 208 mg/dL — ABNORMAL HIGH (ref 65–99)

## 2017-10-26 LAB — LACTIC ACID, PLASMA
Lactic Acid, Venous: 1.7 mmol/L (ref 0.5–1.9)
Lactic Acid, Venous: 1.5 mmol/L (ref 0.5–1.9)

## 2017-10-26 MED ORDER — ADULT MULTIVITAMIN W/MINERALS CH
1.0000 | ORAL_TABLET | Freq: Every day | ORAL | Status: DC
  Administered 2017-10-26 – 2017-10-28 (×3): 1 via ORAL
  Filled 2017-10-26 (×3): qty 1

## 2017-10-26 MED ORDER — ACETAMINOPHEN 650 MG RE SUPP: 650.0000 mg | Freq: Four times a day (QID) | RECTAL | Status: DC | PRN

## 2017-10-26 MED ORDER — BUDESONIDE 0.5 MG/2ML IN SUSP
0.5000 mg | Freq: Two times a day (BID) | RESPIRATORY_TRACT | Status: DC
  Administered 2017-10-26 – 2017-10-28 (×4): 0.5 mg via RESPIRATORY_TRACT
  Filled 2017-10-26 (×5): qty 2

## 2017-10-26 MED ORDER — METHYLPREDNISOLONE SODIUM SUCC 40 MG IJ SOLR
40.0000 mg | Freq: Every day | INTRAMUSCULAR | Status: DC
  Administered 2017-10-26 – 2017-10-28 (×3): 40 mg via INTRAVENOUS
  Filled 2017-10-26 (×3): qty 1

## 2017-10-26 MED ORDER — ATORVASTATIN CALCIUM 20 MG PO TABS
20.0000 mg | ORAL_TABLET | Freq: Every day | ORAL | Status: DC
  Administered 2017-10-26 – 2017-10-27 (×2): 20 mg via ORAL
  Filled 2017-10-26 (×2): qty 1

## 2017-10-26 MED ORDER — ENOXAPARIN SODIUM 40 MG/0.4ML ~~LOC~~ SOLN
40.0000 mg | SUBCUTANEOUS | Status: DC
  Administered 2017-10-26 – 2017-10-27 (×2): 40 mg via SUBCUTANEOUS
  Filled 2017-10-26 (×2): qty 0.4

## 2017-10-26 MED ORDER — INSULIN ASPART 100 UNIT/ML ~~LOC~~ SOLN
0.0000 [IU] | Freq: Every day | SUBCUTANEOUS | Status: DC
  Administered 2017-10-26 – 2017-10-27 (×2): 2 [IU] via SUBCUTANEOUS
  Filled 2017-10-26 (×2): qty 1

## 2017-10-26 MED ORDER — IPRATROPIUM-ALBUTEROL 0.5-2.5 (3) MG/3ML IN SOLN
3.0000 mL | Freq: Four times a day (QID) | RESPIRATORY_TRACT | Status: DC
  Administered 2017-10-26 – 2017-10-28 (×9): 3 mL via RESPIRATORY_TRACT
  Filled 2017-10-26 (×9): qty 3

## 2017-10-26 MED ORDER — COLCHICINE 0.6 MG PO TABS: 0.6000 mg | ORAL_TABLET | ORAL | Status: DC | PRN

## 2017-10-26 MED ORDER — SODIUM CHLORIDE 0.9 % IV SOLN
INTRAVENOUS | Status: DC
  Administered 2017-10-26 – 2017-10-27 (×2): via INTRAVENOUS

## 2017-10-26 MED ORDER — ACETAMINOPHEN 325 MG PO TABS: 650.0000 mg | ORAL_TABLET | Freq: Four times a day (QID) | ORAL | Status: DC | PRN

## 2017-10-26 MED ORDER — DOXYCYCLINE HYCLATE 100 MG PO TABS
100.0000 mg | ORAL_TABLET | Freq: Two times a day (BID) | ORAL | Status: DC
  Administered 2017-10-26 – 2017-10-27 (×3): 100 mg via ORAL
  Filled 2017-10-26 (×3): qty 1

## 2017-10-26 MED ORDER — QUINAPRIL HCL 10 MG PO TABS
40.0000 mg | ORAL_TABLET | Freq: Every day | ORAL | Status: DC
  Administered 2017-10-26 – 2017-10-27 (×2): 40 mg via ORAL
  Filled 2017-10-26 (×4): qty 4

## 2017-10-26 MED ORDER — GABAPENTIN 300 MG PO CAPS
300.0000 mg | ORAL_CAPSULE | Freq: Three times a day (TID) | ORAL | Status: DC
  Administered 2017-10-26 – 2017-10-28 (×7): 300 mg via ORAL
  Filled 2017-10-26 (×7): qty 1

## 2017-10-26 MED ORDER — ASPIRIN EC 81 MG PO TBEC
81.0000 mg | DELAYED_RELEASE_TABLET | Freq: Every day | ORAL | Status: DC
  Administered 2017-10-27 – 2017-10-28 (×2): 81 mg via ORAL
  Filled 2017-10-26 (×4): qty 1

## 2017-10-26 MED ORDER — SODIUM CHLORIDE 0.9 % IV SOLN
1.0000 g | INTRAVENOUS | Status: DC
  Filled 2017-10-26: qty 10

## 2017-10-26 MED ORDER — INSULIN ASPART 100 UNIT/ML ~~LOC~~ SOLN
0.0000 [IU] | Freq: Three times a day (TID) | SUBCUTANEOUS | Status: DC
Start: 2017-10-26 — End: 2017-10-28
  Administered 2017-10-26 – 2017-10-27 (×2): 2 [IU] via SUBCUTANEOUS
  Administered 2017-10-27: 7 [IU] via SUBCUTANEOUS
  Administered 2017-10-27 – 2017-10-28 (×2): 1 [IU] via SUBCUTANEOUS
  Administered 2017-10-28: 3 [IU] via SUBCUTANEOUS
  Filled 2017-10-26 (×6): qty 1

## 2017-10-26 MED ORDER — VITAMIN C 500 MG PO TABS
1000.0000 mg | ORAL_TABLET | Freq: Every day | ORAL | Status: DC
  Administered 2017-10-27 – 2017-10-28 (×2): 1000 mg via ORAL
  Filled 2017-10-26 (×2): qty 2

## 2017-10-26 MED ORDER — SODIUM CHLORIDE 0.9 % IV SOLN
1.0000 g | Freq: Once | INTRAVENOUS | Status: AC
  Administered 2017-10-26: 1 g via INTRAVENOUS
  Filled 2017-10-26: qty 10

## 2017-10-26 MED ORDER — SODIUM CHLORIDE 0.9 % IV SOLN
1000.0000 mL | Freq: Once | INTRAVENOUS | Status: AC
  Administered 2017-10-26: 1000 mL via INTRAVENOUS

## 2017-10-26 NOTE — H&P (Addendum)
[place] [facility] [place] NAME: Jeremy Valencia    MR#:  [id number]  DATE OF BIRTH:  [birth year]  DATE OF ADMISSION:  10/26/2017  PRIMARY CARE PHYSICIAN: Adin Hector, MD   REQUESTING/REFERRING PHYSICIAN: Dr Lavonia Drafts  CHIEF COMPLAINT:   Chief Complaint  Patient presents with  . Fever  . Sore Throat    HISTORY OF PRESENT ILLNESS:  Jeremy Valencia  is a 77 y.o. male presents with fever and sore throat and cough.  He has been coughing up clear phlegm.  His fever started on Tuesday but worsened overnight and up to 101.4.  He was started on a Z-Pak yesterday.  He has been feeling very weak and could not get off the toilet today.  He is not been eating or drinking very well.  This morning he did have nausea and dry heaves.  As per family he perked up in the ER after a liter of IV fluids was given.  In the ER, he was found to have a low-grade temperature and a elevated white count.  His flu swab was negative chest x-ray and CT scan were negative.  Patient did have some audible wheeze.  PAST MEDICAL HISTORY:   Past Medical History:  Diagnosis Date  . Bursitis 05/16/2013   BURSITIS/CAPSULITIS RT ANKLE  . Cancer ([place])    skin cancer  . 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 Diabetes mellitus without complication ([place])   . Hypertension   . Plantar fasciitis     PAST SURGICAL HISTORY:   Past Surgical History:  Procedure Laterality Date  . BACK SURGERY    . FOOT SURGERY    . right eye      SOCIAL HISTORY:   Social History   Tobacco Use  . Smoking status: Former Research scientist (life sciences)  . Smokeless tobacco: Never Used  . Tobacco comment: 1998  Substance Use Topics  . Alcohol use: Yes    Comment: rarely    FAMILY HISTORY:   Family History  Problem Relation Age of Onset  . Cerebral aneurysm Mother   . CAD Father   . Parkinson's disease Father   . Prostate cancer Father     DRUG ALLERGIES:    Allergies  Allergen Reactions  . Rofecoxib Other (See Comments)    Other reaction(s): Unknown    REVIEW OF SYSTEMS:  CONSTITUTIONAL: Positive for fever and cold chills. Positive for fatigue.  EYES: No blurred or double vision.  EARS, NOSE, AND THROAT: No tinnitus or ear pain.  Positive for sore throat RESPIRATORY: Positive for cough, shortness of breath, and wheezing.  No hemoptysis.  CARDIOVASCULAR: No chest pain, orthopnea, edema.  GASTROINTESTINAL: Positive for nausea and dry heaves.  No vomiting, diarrhea or abdominal pain. No blood in bowel movements GENITOURINARY: No dysuria, hematuria.  ENDOCRINE: No polyuria, nocturia,  HEMATOLOGY: No anemia, easy bruising or bleeding SKIN: Rash on face MUSCULOSKELETAL: No joint pain or arthritis.   NEUROLOGIC: No tingling, numbness, weakness.  PSYCHIATRY: No anxiety or depression.   MEDICATIONS AT HOME:   Prior to Admission medications   Medication Sig Start Date End Date Taking? Authorizing Provider  Ascorbic Acid (VITAMIN C) 1000 MG tablet Take 1,000 mg by mouth daily.   Yes [provider]  aspirin 81 MG tablet Take 81 mg by mouth daily.   Yes [provider]  atorvastatin (LIPITOR) 20 MG tablet Take 20 mg by mouth daily.  Yes [provider]  Cholecalciferol (VITAMIN D3) 1000 units CAPS Take 1,000 Units by mouth daily.    Yes [provider]  co-enzyme Q-10 50 MG capsule Take 50 mg by mouth as directed.   Yes [provider]  colchicine 0.6 MG tablet Take 0.6 mg by mouth as needed.    Yes [provider]  gabapentin (NEURONTIN) 300 MG capsule Take 300 mg by mouth 3 (three) times daily.    Yes [provider]  glimepiride (AMARYL) 2 MG tablet TAKE ONE TABLET BY MOUTH ONCE DAILY 03/23/16  Yes [provider]  hydrochlorothiazide (HYDRODIURIL) 25 MG tablet Take 25 mg by mouth daily.   Yes [provider]  metFORMIN (GLUCOPHAGE) 1000 MG tablet Take 1,000 mg  by mouth 2 (two) times daily with a meal.   Yes [provider]  Multiple Vitamin (MULTI-VITAMINS) TABS Take 1 tablet by mouth daily.    Yes [provider]  Omega-3 Fatty Acids (FISH OIL) 1000 MG CAPS Take 3-4 capsules by mouth daily.    Yes [provider]  quinapril (ACCUPRIL) 40 MG tablet Take 40 mg by mouth at bedtime.    Yes [provider]  Zinc Acetate 50 MG CAPS Take 1 capsule by mouth daily.    Yes [provider]      VITAL SIGNS:  Blood pressure (!) 147/104, pulse 91, temperature 99.5 F (37.5 C), temperature source Oral, resp. rate (!) 29, height 6' (1.829 m), weight 121.5 kg (267 lb 14.4 oz), SpO2 96 %.  PHYSICAL EXAMINATION:  GENERAL:  77 y.o.-year-old patient lying in the bed with no acute distress.  Audible wheeze heard EYES: Left eye equal, round, reactive to light and accommodation. No scleral icterus.  Left eye extraocular muscles intact.  Right eye prosthesis. HEENT: Head atraumatic, normocephalic. Oropharynx and nasopharynx clear.  NECK:  Supple, no jugular venous distention. No thyroid enlargement, no tenderness.  LUNGS: Decreased breath sounds bilaterally, positive expiratory wheezing, no rales,rhonchi or crepitation. No use of accessory muscles of respiration.  CARDIOVASCULAR: S1, S2  tachycardic. No murmurs, rubs, or gallops.  ABDOMEN: Soft, nontender, nondistended. Bowel sounds present. No organomegaly or mass.  EXTREMITIES: Trace edema, no cyanosis, or clubbing.  NEUROLOGIC: Cranial nerves II through XII are intact. Muscle strength 5/5 in all extremities. Sensation intact. Gait not checked.  PSYCHIATRIC: The patient is alert and oriented x 3.  SKIN: Facial erythema  LABORATORY PANEL:   CBC Recent Labs  Lab 10/26/17 1024  WBC 19.0*  HGB 14.3  HCT 42.8  PLT 233   ------------------------------------------------------------------------------------------------------------------  Chemistries  Recent Labs  Lab  10/26/17 1024  NA 137  K 3.8  CL 104  CO2 23  GLUCOSE 195*  BUN 18  CREATININE 1.02  CALCIUM 8.5*  AST 25  ALT 20  ALKPHOS 40  BILITOT 1.4*   ------------------------------------------------------------------------------------------------------------------  Cardiac Enzymes Recent Labs  Lab 10/26/17 1024  TROPONINI <0.03   ------------------------------------------------------------------------------------------------------------------  RADIOLOGY:  Ct Chest Wo Contrast  Result Date: 10/26/2017 CLINICAL DATA:  77 year old current history of hypertension and diabetes, presenting with acute onset of fever, cough and sore throat that began yesterday. Patient currently on antibiotic therapy with azithromycin for cellulitis, started 2 days ago. Current smoker. EXAM: CT CHEST WITHOUT CONTRAST TECHNIQUE: Multidetector CT imaging of the chest was performed following the standard protocol without IV contrast. COMPARISON:  No prior CT. Chest x-rays earlier same day, 11/18/2014 and earlier. FINDINGS: Cardiovascular: Normal heart size. Severe three-vessel coronary atherosclerosis. No pericardial  effusion. Dense mitral annular calcification. Mild aortic annular calcification. Moderate to severe atherosclerosis involving the thoracic and upper abdominal aorta without evidence of aneurysm. Mediastinum/Nodes: Allowing for the unenhanced technique, no pathologic mediastinal or hilar lymphadenopathy. Upper normal sized subcarinal lymph nodes (station 5) measuring approximately 2.6 x 1.6 cm. Very small hiatal hernia. Normal appearing esophagus. Normal-appearing thyroid gland. Lungs/Pleura: Emphysematous changes throughout both lungs, with peripheral blebs involving the upper lobes bilateral. Vague likely mixed ground-glass and solid nodule involving the right middle lobe anteriorly measuring approximately 5 x 8 mm (mean 7 mm) on image 83 of series 3 and coronal image 51. No parenchymal nodules or masses  elsewhere in either lung. No confluent airspace consolidation. No pleural effusions. Mild right lower lobe bronchiectasis. Central airways patent with mild to moderate bronchial wall thickening. Upper Abdomen: Diverticulosis involving the visualized proximal descending colon. Otherwise unremarkable for the unenhanced technique. 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 No acute abnormalities. IMPRESSION: 1. Likely mixed ground-glass and solid nodule involving the anterior right middle lobe measuring 7 mm. Follow-up non-contrast CT recommended at 3-6 months to confirm persistence. If unchanged, and solid component remains <6 mm, annual CT is recommended until 5 years of stability has been established. If persistent these nodules should be considered highly suspicious if the solid component of the nodule is 6 mm or greater in size and enlarging. This recommendation follows the consensus statement: Guidelines for Management of Incidental Pulmonary Nodules Detected on CT Images: From the Fleischner Society 2017; Radiology 2017; 284:228-243. 2.  No acute cardiopulmonary disease otherwise. 3. Severe three-vessel coronary atherosclerosis. 4. Aortic Atherosclerosis (ICD10-I70.0) and Emphysema (ICD10-J43.9). 5. Small hiatal hernia. 6. Proximal descending colon diverticulosis. Electronically Signed   By: Evangeline Dakin M.D.   On: 10/26/2017 12:47   Dg Chest Port 1 View  Result Date: 10/26/2017 CLINICAL DATA:  Cough and fever EXAM: PORTABLE CHEST 1 VIEW COMPARISON:  November 18, 2014. FINDINGS: There is no appreciable edema or consolidation. Heart size and pulmonary vascularity are normal. No adenopathy. There is aortic atherosclerosis. Scattered metallic pellets are noted throughout the chest, stable. IMPRESSION: Aortic atherosclerosis. No edema or consolidation. Stable cardiac silhouette. Aortic Atherosclerosis (ICD10-I70.0). Electronically Signed   By: Lowella Grip III M.D.   On: 10/26/2017 11:20    EKG:   Sinus tachycardia 101 bpm nonspecific ST-T wave changes.  This was personally interpreted by me  IMPRESSION AND PLAN:   1.  Systemic inflammatory response syndrome, lactic acidosis.  The patient does have an audible wheeze.  Concerned there could still be a pneumonia here that we are not seen because the patient is a little dehydrated.  Could still be an asthmatic bronchitis.  Doxycycline and Rocephin ordered.  Send off a respiratory viral panel.  Gentle IV fluid hydration.  Start Solu-Medrol and nebulizer treatments with budesonide nebulizers and DuoNeb 2.  Type 2 diabetes mellitus.  Start on sliding scale insulin.  May need a little Lantus insulin at night.  Hold oral medications 3.  Essential hypertension.  Hold hydrochlorothiazide.  Continue quinapril 4.  Hyperlipidemia unspecified continue statin 5.  Nodule in the lung will need to be followed as outpatient  Laboratory data reviewed by me, EKG reviewed and interpreted by me, chest x-ray reviewed by me   All the records are reviewed and case discussed with ED provider. Management plans discussed with the patient, family and they are in agreement.  CODE STATUS: Full code  TOTAL TIME TAKING CARE OF  THIS PATIENT: 50 minutes.    Loletha Grayer M.D on 10/26/2017 at 1:50 PM  Between 7am to 6pm - Pager - [phone]  After 6pm call admission pager [phone]  [facility] Office  [phone]  CC: Primary care physician; Adin Hector, MD

## 2017-10-26 NOTE — ED Triage Notes (Signed)
PT arrived via ems from home with complaints of fever, sore throat, and diarrhea. Pt was recently treated with antibiotics for cellulitis. Family reports sore throat and fever for 2 days. Last dosage of tylenol was 0930 and pt was given 325mg . Wife states pt is uncooperative when he has a fever. Pt was given a zpack on Tuesday.

## 2017-10-26 NOTE — Progress Notes (Signed)
Pharmacy Antibiotic Note  Jeremy Valencia is a 77 y.o. male admitted on 10/26/2017 with possible pneumonia.  Pharmacy has been consulted for ceftriaxone dosing. Patient is also ordered doxycycline.   Plan: Ceftriaxone 1 g iv q 24 hours.   Height: 6' (182.9 cm) Weight: 267 lb 14.4 oz (121.5 kg) IBW/kg (Calculated) : 77.6  Temp (24hrs), Avg:99 F (37.2 C), Min:98.5 F (36.9 C), Max:99.5 F (37.5 C)  Recent Labs  Lab 10/26/17 1024 10/26/17 1442  WBC 19.0*  --   CREATININE 1.02  --   LATICACIDVEN 1.7 1.5    Estimated Creatinine Clearance: 80.4 mL/min (by C-G formula based on SCr of 1.02 mg/dL).    Allergies  Allergen Reactions  . Rofecoxib Other (See Comments)    Other reaction(s): Unknown    Antimicrobials this admission: Ceftriaxone 2/21 >>  doxycycline 2/21 >>   Dose adjustments this admission:   Microbiology results: 2/21 BCx: sent 2/21 UCx: sent  2/21 Sputum: sent   Thank you for allowing pharmacy to be a part of this patient's care.  Napoleon Form 10/26/2017 4:49 PM

## 2017-10-26 NOTE — ED Provider Notes (Signed)
[facility] Emergency Department Provider Note   ____________________________________________    I have reviewed the triage vital signs and the nursing notes.   HISTORY  Chief Complaint Fever    HPI Jeremy Valencia is a 77 y.o. male who presents with fever and apparently some confusion which started today.  Patient has a history of diabetes, review of records demonstrates recent successful treatment of cellulitis of the foot.  However apparently he developed a cough 2 days ago for which he was put on a Z-Pak.  No significant improvement.  Reportedly fever today, received Tylenol prior to arrival.  Complains of productive cough.  No chest pain, no pleurisy.  No rash.  No dysuria or abdominal pain.     Past Medical History:  Diagnosis Date  . Bursitis 05/16/2013   BURSITIS/CAPSULITIS RT ANKLE  . Cancer ([place])    skin cancer  . 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 Diabetes mellitus without complication ([place])   . Hypertension   . Plantar fasciitis     Patient Active Problem List   Diagnosis Date Noted  . Type 2 diabetes mellitus ([place]) 06/19/2015  . Microscopic hematuria 06/19/2015  . Enlarged prostate 06/19/2015  . 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 Diabetes mellitus, type 2 ([place]) 03/13/2015  . DD (diverticular disease) 03/13/2015  . Acid reflux 03/13/2015  . Chronic LBP 01/08/2015  . Essential (primary) hypertension 07/18/2014  . Enthesopathy of ankle and tarsus 07/17/2014  . Dupuytren's contracture of foot 07/17/2014  . Capsulitis of right foot   . Plantar fasciitis   . Bursitis 05/16/2013    History reviewed. No pertinent surgical history.  Prior to Admission medications   Medication Sig Start Date End Date Taking? Authorizing Provider  Ascorbic Acid (VITAMIN C) 1000 MG tablet Take 1,000 mg by mouth daily.    [provider]  aspirin 81 MG tablet Take 81 mg by mouth daily.     [provider]  atorvastatin (LIPITOR) 20 MG tablet TAKE ONE TABLET BY MOUTH ONCE DAILY 03/23/16   [provider]  Cholecalciferol (VITAMIN D3) 1000 units CAPS Take by mouth.    [provider]  Coenzyme Q10 (CO Q 10 PO) Take by mouth daily.    [provider]  Coenzyme Q10 POWD Take by mouth.    [provider]  colchicine 0.6 MG tablet Take by mouth.    [provider]  DOCOSAHEXAENOIC ACID PO Take by mouth.    [provider]  doxycycline (VIBRA-TABS) 100 MG tablet Take 1 tablet (100 mg total) by mouth 2 (two) times daily. 10/13/17   Edrick Kins, DPM  DULoxetine (CYMBALTA) 30 MG capsule Take by mouth. 11/25/14 11/25/15  [provider]  Flaxseed, Linseed, (FLAX SEEDS PO) Take by mouth daily.    [provider]  gabapentin (NEURONTIN) 300 MG capsule TAKE ONE CAPSULE BY MOUTH THREE TIMES DAILY 08/30/17   Hyatt, Max T, DPM  glimepiride (AMARYL) 2 MG tablet TAKE ONE TABLET BY MOUTH ONCE DAILY 03/23/16   [provider]  hydrochlorothiazide (HYDRODIURIL) 25 MG tablet Take 25 mg by mouth daily.    [provider]  hydrocortisone valerate ointment (WEST-CORT) 0.2 %  12/17/14   [provider]  Ibuprofen-Diphenhydramine Cit (ADVIL PM PO) Take by mouth at bedtime as needed.    [provider]  metFORMIN (GLUCOPHAGE) 1000 MG tablet Take 1,000 mg by mouth 2 (  two) times daily with a meal.    [provider]  Multiple Vitamin (MULTI-VITAMINS) TABS Take by mouth.    [provider]  Multiple Vitamin (MULTIVITAMIN) tablet Take 1 tablet by mouth daily.    [provider]  mupirocin ointment (BACTROBAN) 2 % Apply to wound after soaking BID 10/04/17   Hyatt, Max T, DPM  Omega-3 Fatty Acids (FISH OIL) 1000 MG CAPS Take by mouth daily.    [provider]  ondansetron (ZOFRAN) 4 MG tablet Take 1 tablet (4 mg total) by mouth every 8 (eight) hours as needed. 09/29/16    Hyatt, Max T, DPM  quinapril (ACCUPRIL) 40 MG tablet TAKE ONE-HALF TABLET BY MOUTH TWICE DAILY 05/23/16   [provider]  tobramycin-dexamethasone [facility]) ophthalmic solution  01/05/15   [provider]  traZODone (DESYREL) 50 MG tablet 1 to 2 tablets PO at bedtime PRN sleep 07/26/17   [provider]  Zinc Acetate 50 MG CAPS     [provider]     Allergies Rofecoxib  Family History  Family history unknown: Yes    Social History Social History   Tobacco Use  . Smoking status: Former Research scientist (life sciences)  . Smokeless tobacco: Never Used  . Tobacco comment: 1998  Substance Use Topics  . Alcohol use: Yes    Comment: rarely  . Drug use: No    Review of Systems  Constitutional: Positive fever Eyes: No visual changes.  ENT: Mild sore throat Cardiovascular: Denies chest pain. Respiratory: Positive productive cough Gastrointestinal: No abdominal pain.   Genitourinary: No dysuria Musculoskeletal: Negative for back pain. Skin: Negative for rash. Neurological: Negative for headaches   ____________________________________________   PHYSICAL EXAM:  VITAL SIGNS: ED Triage Vitals  Enc Vitals Group     BP 10/26/17 1029 (!) 152/75     Pulse Rate 10/26/17 1029 100     Resp 10/26/17 1029 (!) 31     Temp 10/26/17 1029 99.5 F (37.5 C)     Temp Source 10/26/17 1029 Oral     SpO2 10/26/17 1029 93 %     Weight 10/26/17 1027 121.5 kg (267 lb 14.4 oz)     Height 10/26/17 1027 1.829 m (6')     Head Circumference --      Peak Flow --      Pain Score --      Pain Loc --      Pain Edu? --      Excl. in [place]? --     Constitutional: Alert. No acute distress. Pleasant and interactive Eyes: Conjunctivae are normal.   Nose: No congestion/rhinnorhea. Mouth/Throat: Mucous membranes are moist.  Pharynx normal Neck:  Painless ROM Cardiovascular: Normal rate, regular rhythm. Grossly normal heart sounds.  Good peripheral circulation. Respiratory: Mild  tachypnea..  No retractions.  Bibasilar lung rales, mild Gastrointestinal: Soft and nontender. No distention.  Genitourinary: deferred Musculoskeletal: No lower extremity tenderness nor edema.  Warm and well perfused  Neurologic:  Normal speech and language. No gross focal neurologic deficits are appreciated.  Skin:  Skin is warm, dry and intact.  No erythema or ulcerations on the feet Psychiatric: Mood and affect are normal. Speech and behavior are normal.  ____________________________________________   LABS (all labs ordered are listed, but only abnormal results are displayed)  Labs Reviewed  COMPREHENSIVE METABOLIC PANEL - Abnormal; Notable for the following components:      Result Value   Glucose, Bld 195 (*)    Calcium 8.5 (*)  Total Bilirubin 1.4 (*)    All other components within normal limits  CBC WITH DIFFERENTIAL/PLATELET - Abnormal; Notable for the following components:   WBC 19.0 (*)    Neutro Abs 16.1 (*)    Lymphs Abs 0.9 (*)    Monocytes Absolute 1.9 (*)    All other components within normal limits  URINALYSIS, COMPLETE (UACMP) WITH MICROSCOPIC - Abnormal; Notable for the following components:   Color, Urine YELLOW (*)    APPearance CLEAR (*)    Glucose, UA 150 (*)    Ketones, ur 20 (*)    Protein, ur 100 (*)    Squamous Epithelial / LPF 0-5 (*)    All other components within normal limits  CULTURE, BLOOD (ROUTINE X 2)  CULTURE, BLOOD (ROUTINE X 2)  URINE CULTURE  LACTIC ACID, PLASMA  INFLUENZA PANEL BY PCR (TYPE A & B)  TROPONIN I  LACTIC ACID, PLASMA   ____________________________________________  EKG  ED ECG REPORT I, Lavonia Drafts, the attending physician, personally viewed and interpreted this ECG.  Date: 10/26/2017   Rhythm: normal sinus rhythm QRS Axis: normal Intervals: normal ST/T Wave abnormalities: normal Narrative Interpretation sinus tachycardia  ____________________________________________  RADIOLOGY  Chest x-ray negative  for pneumonia Strong clinical suspicion for pneumonia so sent for CT chest, also negative for pneumonia, groundglass discussed with family ____________________________________________   PROCEDURES  Procedure(s) performed: No  Procedures   Critical Care performed:No ____________________________________________   INITIAL IMPRESSION / ASSESSMENT AND PLAN / ED COURSE  Pertinent labs & imaging results that were available during my care of the patient were reviewed by me and considered in my medical decision making (see chart for details).  ----------------------------------------- 10:34 AM on 10/26/2017 -----------------------------------------  Patient presents with reports of fever and possibly some confusion,  he appears to be AAO x4 here.  Mildly elevated temperature, heart rate 100, some tachypnea.  Given history of recent cough suspicious for pneumonia, at this point does not meet sepsis criteria.  Pending labs, chest x-ray further evaluation  ----------------------------------------- 1:06 PM on 10/26/2017 -----------------------------------------  Lactic acid normal.  Elevated white blood cell count.  Family is here now and describes that the patient was confused overnight and so weak that he could not stand up.  He is apparently been like this in the past prior to becoming septic.  Strong suspicion for pneumonia however CT chest does not demonstrate consolidation.  Lactic normal.  Culture sent, will admit to the hospital service   ____________________________________________   FINAL CLINICAL IMPRESSION(S) / ED DIAGNOSES  Final diagnoses:  SIRS (systemic inflammatory response syndrome) (HCC)  Weakness  Cough  Altered mental status, unspecified altered mental status type        Note:  This document was prepared using Dragon voice recognition software and may include unintentional dictation errors.    Lavonia Drafts, MD 10/26/17 [phone]

## 2017-10-26 NOTE — ED Notes (Signed)
Pt family requesting food, RN confirmed Pt can eat and has notified Food Service to order trays as we are out. RN will continue to monitor.

## 2017-10-26 NOTE — ED Notes (Signed)
Report was called to [facility] on [address]. Pt to be transported at this time.

## 2017-10-26 NOTE — ED Notes (Signed)
Daughters at bedside  

## 2017-10-27 LAB — BASIC METABOLIC PANEL
Anion gap: 10 (ref 5–15)
BUN: 19 mg/dL (ref 6–20)
CO2: 25 mmol/L (ref 22–32)
CREATININE: 1.09 mg/dL (ref 0.61–1.24)
Calcium: 8.4 mg/dL — ABNORMAL LOW (ref 8.9–10.3)
Chloride: 104 mmol/L (ref 101–111)
GFR calc non Af Amer: >60 mL/min (ref >60)
Glucose, Bld: 177 mg/dL — ABNORMAL HIGH (ref 65–99)
Potassium: 3.8 mmol/L (ref 3.5–5.1)
Sodium: 139 mmol/L (ref 135–145)

## 2017-10-27 LAB — URINE CULTURE: Culture: NO GROWTH

## 2017-10-27 LAB — PROCALCITONIN: Procalcitonin: 0.13 ng/mL

## 2017-10-27 LAB — GLUCOSE, CAPILLARY
GLUCOSE-CAPILLARY: 137 mg/dL — AB (ref 65–99)
GLUCOSE-CAPILLARY: 206 mg/dL — AB (ref 65–99)
Glucose-Capillary: 158 mg/dL — ABNORMAL HIGH (ref 65–99)
Glucose-Capillary: 326 mg/dL — ABNORMAL HIGH (ref 65–99)

## 2017-10-27 LAB — CBC
HCT: 38.8 % — ABNORMAL LOW (ref 40.0–52.0)
Hemoglobin: 13.2 g/dL (ref 13.0–18.0)
MCH: 33 pg (ref 26.0–34.0)
MCHC: 33.9 g/dL (ref 32.0–36.0)
MCV: 97.3 fL (ref 80.0–100.0)
PLATELETS: 209 10*3/uL (ref 150–440)
RBC: 3.99 MIL/uL — AB (ref 4.40–5.90)
RDW: 12.4 % (ref 11.5–14.5)
WBC: 13.3 10*3/uL — ABNORMAL HIGH (ref 3.8–10.6)

## 2017-10-27 MED ORDER — CEPHALEXIN 500 MG PO CAPS
500.0000 mg | ORAL_CAPSULE | Freq: Three times a day (TID) | ORAL | Status: DC
  Administered 2017-10-27 – 2017-10-28 (×4): 500 mg via ORAL
  Filled 2017-10-27 (×4): qty 1

## 2017-10-27 NOTE — Progress Notes (Signed)
Jeremy Valencia met with patient and wife and offered emotional and spiritual support, and prayer.

## 2017-10-27 NOTE — Progress Notes (Signed)
Okay per Dr. Molli Hazard to discontinue respiratory PCR as pt is feeling better and was not collected.

## 2017-10-27 NOTE — Care Management Important Message (Signed)
Important Message  Patient Details  Name: Jeremy Valencia MRN: [id number] Date of Birth: Sep 06, 1940   Medicare Important Message Given:  Yes    Beverly Sessions, RN 10/27/2017, 11:20 AM

## 2017-10-27 NOTE — Progress Notes (Signed)
[facility] [place] NAME: Jeremy Valencia    MR#:  [id number]  DATE OF BIRTH:  1940/11/20  SUBJECTIVE:  CHIEF COMPLAINT:   Chief Complaint  Patient presents with  . Fever  . Sore Throat    Complaint of sore throat, fever and cough and feelings short of breath.   He was a smoker for up to 20 years in past. Never diagnosed with COPD who required inhalers so far.  REVIEW OF SYSTEMS:  CONSTITUTIONAL: No fever, fatigue or weakness.  EYES: No blurred or double vision.  EARS, NOSE, AND THROAT: No tinnitus or ear pain.  RESPIRATORY: He have cough, shortness of breath, wheezing , no hemoptysis.  CARDIOVASCULAR: No chest pain, orthopnea, edema.  GASTROINTESTINAL: No nausea, vomiting, diarrhea or abdominal pain.  GENITOURINARY: No dysuria, hematuria.  ENDOCRINE: No polyuria, nocturia,  HEMATOLOGY: No anemia, easy bruising or bleeding SKIN: No rash or lesion. MUSCULOSKELETAL: No joint pain or arthritis.   NEUROLOGIC: No tingling, numbness, weakness.  PSYCHIATRY: No anxiety or depression.   ROS  DRUG ALLERGIES:   Allergies  Allergen Reactions  . Rofecoxib Other (See Comments)    Other reaction(s): Unknown    VITALS:  Blood pressure 139/75, pulse (!) 101, temperature 98.5 F (36.9 C), temperature source Oral, resp. rate 16, height 6' (1.829 m), weight 121.5 kg (267 lb 14.4 oz), SpO2 96 %.  PHYSICAL EXAMINATION:  GENERAL:  77 y.o.-year-old patient lying in the bed with no acute distress.  EYES: Pupils equal, round, reactive to light and accommodation. No scleral icterus. Extraocular muscles intact.  HEENT: Head atraumatic, normocephalic. Oropharynx and nasopharynx clear.  NECK:  Supple, no jugular venous distention. No thyroid enlargement, no tenderness.  LUNGS: Normal breath sounds bilaterally, bilateral wheezing, no crepitation. No use of accessory muscles of respiration. Requiring oxygen. CARDIOVASCULAR: S1, S2 normal. No murmurs, rubs,  or gallops.  ABDOMEN: Soft, nontender, nondistended. Bowel sounds present. No organomegaly or mass.  EXTREMITIES: No pedal edema, cyanosis, or clubbing.  NEUROLOGIC: Cranial nerves II through XII are intact. Muscle strength 5/5 in all extremities. Sensation intact. Gait not checked.  PSYCHIATRIC: The patient is alert and oriented x 3.  SKIN: No obvious rash, lesion, or ulcer.   Physical Exam LABORATORY PANEL:   CBC Recent Labs  Lab 10/27/17 0449  WBC 13.3*  HGB 13.2  HCT 38.8*  PLT 209   ------------------------------------------------------------------------------------------------------------------  Chemistries  Recent Labs  Lab 10/26/17 1024 10/27/17 0449  NA 137 139  K 3.8 3.8  CL 104 104  CO2 23 25  GLUCOSE 195* 177*  BUN 18 19  CREATININE 1.02 1.09  CALCIUM 8.5* 8.4*  AST 25  --   ALT 20  --   ALKPHOS 40  --   BILITOT 1.4*  --    ------------------------------------------------------------------------------------------------------------------  Cardiac Enzymes Recent Labs  Lab 10/26/17 1024  TROPONINI <0.03   ------------------------------------------------------------------------------------------------------------------  RADIOLOGY:  Ct Chest Wo Contrast  Result Date: 10/26/2017 CLINICAL DATA:  77 year old current history of hypertension and diabetes, presenting with acute onset of fever, cough and sore throat that began yesterday. Patient currently on antibiotic therapy with azithromycin for cellulitis, started 2 days ago. Current smoker. EXAM: CT CHEST WITHOUT CONTRAST TECHNIQUE: Multidetector CT imaging of the chest was performed following the standard protocol without IV contrast. COMPARISON:  No prior CT. Chest x-rays earlier same day, 11/18/2014 and earlier. FINDINGS: Cardiovascular: Normal heart size. Severe three-vessel coronary atherosclerosis. No pericardial effusion. Dense mitral annular calcification. Mild aortic annular  calcification. Moderate to  severe atherosclerosis involving the thoracic and upper abdominal aorta without evidence of aneurysm. Mediastinum/Nodes: Allowing for the unenhanced technique, no pathologic mediastinal or hilar lymphadenopathy. Upper normal sized subcarinal lymph nodes (station 5) measuring approximately 2.6 x 1.6 cm. Very small hiatal hernia. Normal appearing esophagus. Normal-appearing thyroid gland. Lungs/Pleura: Emphysematous changes throughout both lungs, with peripheral blebs involving the upper lobes bilateral. Vague likely mixed ground-glass and solid nodule involving the right middle lobe anteriorly measuring approximately 5 x 8 mm (mean 7 mm) on image 83 of series 3 and coronal image 51. No parenchymal nodules or masses elsewhere in either lung. No confluent airspace consolidation. No pleural effusions. Mild right lower lobe bronchiectasis. Central airways patent with mild to moderate bronchial wall thickening. Upper Abdomen: Diverticulosis involving the visualized proximal descending colon. Otherwise unremarkable for the unenhanced technique. 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 No acute abnormalities. IMPRESSION: 1. Likely mixed ground-glass and solid nodule involving the anterior right middle lobe measuring 7 mm. Follow-up non-contrast CT recommended at 3-6 months to confirm persistence. If unchanged, and solid component remains <6 mm, annual CT is recommended until 5 years of stability has been established. If persistent these nodules should be considered highly suspicious if the solid component of the nodule is 6 mm or greater in size and enlarging. This recommendation follows the consensus statement: Guidelines for Management of Incidental Pulmonary Nodules Detected on CT Images: From the Fleischner Society 2017; Radiology 2017; 284:228-243. 2.  No acute cardiopulmonary disease otherwise. 3. Severe three-vessel coronary atherosclerosis. 4. Aortic  Atherosclerosis (ICD10-I70.0) and Emphysema (ICD10-J43.9). 5. Small hiatal hernia. 6. Proximal descending colon diverticulosis. Electronically Signed   By: Evangeline Dakin M.D.   On: 10/26/2017 12:47   Dg Chest Port 1 View  Result Date: 10/26/2017 CLINICAL DATA:  Cough and fever EXAM: PORTABLE CHEST 1 VIEW COMPARISON:  November 18, 2014. FINDINGS: There is no appreciable edema or consolidation. Heart size and pulmonary vascularity are normal. No adenopathy. There is aortic atherosclerosis. Scattered metallic pellets are noted throughout the chest, stable. IMPRESSION: Aortic atherosclerosis. No edema or consolidation. Stable cardiac silhouette. Aortic Atherosclerosis (ICD10-I70.0). Electronically Signed   By: Lowella Grip III M.D.   On: 10/26/2017 11:20    ASSESSMENT AND PLAN:   Active Problems:   SIRS (systemic inflammatory response syndrome) (HCC)  1.  Systemic inflammatory response syndrome, lactic acidosis.  \    Acute hypoxic respiratory failure  Likely acute bronchitis   Influenza negative.    Solu-Medrol and nebulizer treatments with budesonide nebulizers and DuoNeb   Currently requiring supplemental oxygen, try to taper and ambulate the patient. 2.  Type 2 diabetes mellitus.  Start on sliding scale insulin.   pressure was high due to steroids.   Hold oral medications 3.  Essential hypertension.  Hold hydrochlorothiazide.  Continue quinapril 4.  Hyperlipidemia unspecified continue statin 5.  Nodule in the lung will need to be followed as outpatient   He was a smoker in the past but never diagnosed with COPD, may need inhalers at discharge.   All the records are reviewed and case discussed with Care Management/Social Workerr. Management plans discussed with the patient, family and they are in agreement.  CODE STATUS: full.  TOTAL TIME TAKING CARE OF THIS PATIENT: 35 minutes.   Wife in room during my visit.  POSSIBLE D/C IN 1-2 DAYS, DEPENDING ON CLINICAL  CONDITION.   Vaughan Basta M.D on 10/27/2017   Between 7am to 6pm -  Pager - [phone]  After 6pm go to [email or web address] - password [email or web address] [facility]  Office  [phone]  CC: Primary care physician; Adin Hector, MD  Note: This dictation was prepared with Dragon dictation along with smaller phrase technology. Any transcriptional errors that result from this process are unintentional.

## 2017-10-28 LAB — GLUCOSE, CAPILLARY
GLUCOSE-CAPILLARY: 144 mg/dL — AB (ref 65–99)
Glucose-Capillary: 220 mg/dL — ABNORMAL HIGH (ref 65–99)

## 2017-10-28 MED ORDER — GUAIFENESIN-DM 100-10 MG/5ML PO SYRP
5.0000 mL | ORAL_SOLUTION | ORAL | Status: DC | PRN
  Administered 2017-10-28 (×2): 5 mL via ORAL
  Filled 2017-10-28 (×2): qty 5

## 2017-10-28 MED ORDER — PREDNISONE 20 MG PO TABS: 40.0000 mg | ORAL_TABLET | Freq: Every day | ORAL | 0 refills | Status: AC

## 2017-10-28 MED ORDER — BUDESONIDE-FORMOTEROL FUMARATE 160-4.5 MCG/ACT IN AERO: 2.0000 | INHALATION_SPRAY | Freq: Two times a day (BID) | RESPIRATORY_TRACT | 1 refills | Status: DC

## 2017-10-28 MED ORDER — ALBUTEROL SULFATE HFA 108 (90 BASE) MCG/ACT IN AERS: 2.0000 | INHALATION_SPRAY | Freq: Four times a day (QID) | RESPIRATORY_TRACT | 2 refills | Status: DC | PRN

## 2017-10-28 MED ORDER — DOXYCYCLINE HYCLATE 100 MG PO CAPS: 100.0000 mg | ORAL_CAPSULE | Freq: Two times a day (BID) | ORAL | 0 refills | Status: AC

## 2017-10-28 NOTE — Progress Notes (Signed)
[facility] [place] NAME: Jeremy Valencia    MR#:  [id number]  DATE OF BIRTH:  1940/12/21  SUBJECTIVE:  CHIEF COMPLAINT:   Chief Complaint  Patient presents with  . Fever  . Sore Throat   -  Patient very eager to go home today. Breathing has improved significantly. On room air currently  REVIEW OF SYSTEMS:  Review of Systems  Constitutional: Negative for chills, fever and malaise/fatigue.  HENT: Negative for congestion, ear discharge, hearing loss and nosebleeds.   Eyes: Negative for blurred vision and double vision.  Respiratory: Positive for shortness of breath. Negative for cough and wheezing.   Cardiovascular: Negative for chest pain and palpitations.  Gastrointestinal: Negative for abdominal pain, constipation, diarrhea, nausea and vomiting.  Genitourinary: Negative for dysuria.  Musculoskeletal: Negative for myalgias.  Neurological: Negative for dizziness, speech change, focal weakness, seizures and headaches.  Psychiatric/Behavioral: Negative for depression.    DRUG ALLERGIES:   Allergies  Allergen Reactions  . Rofecoxib Other (See Comments)    Other reaction(s): Unknown    VITALS:  Blood pressure 133/67, pulse 78, temperature 98.5 F (36.9 C), temperature source Oral, resp. rate 20, height 6' (1.829 m), weight 121.5 kg (267 lb 14.4 oz), SpO2 94 %.  PHYSICAL EXAMINATION:  Physical Exam  GENERAL:  77 y.o.-year-old patient lying in the bed with no acute distress.  EYES: Pupils equal, round, reactive to light and accommodation. No scleral icterus. Extraocular muscles intact.  HEENT: Head atraumatic, normocephalic. Oropharynx and nasopharynx clear.  NECK:  Supple, no jugular venous distention. No thyroid enlargement, no tenderness.  LUNGS: Normal breath sounds bilaterally, mild scattered wheezing, no rales,rhonchi or crepitation. No use of accessory muscles of respiration.  CARDIOVASCULAR: S1, S2 normal. No murmurs, rubs,  or gallops.  ABDOMEN: Soft, nontender, nondistended. Bowel sounds present. No organomegaly or mass.  EXTREMITIES: No pedal edema, cyanosis, or clubbing.  NEUROLOGIC: Cranial nerves II through XII are intact. Muscle strength 5/5 in all extremities. Sensation intact. Gait not checked.  PSYCHIATRIC: The patient is alert and oriented x 3.  SKIN: No obvious rash, lesion, or ulcer.    LABORATORY PANEL:   CBC Recent Labs  Lab 10/27/17 0449  WBC 13.3*  HGB 13.2  HCT 38.8*  PLT 209   ------------------------------------------------------------------------------------------------------------------  Chemistries  Recent Labs  Lab 10/26/17 1024 10/27/17 0449  NA 137 139  K 3.8 3.8  CL 104 104  CO2 23 25  GLUCOSE 195* 177*  BUN 18 19  CREATININE 1.02 1.09  CALCIUM 8.5* 8.4*  AST 25  --   ALT 20  --   ALKPHOS 40  --   BILITOT 1.4*  --    ------------------------------------------------------------------------------------------------------------------  Cardiac Enzymes Recent Labs  Lab 10/26/17 1024  TROPONINI <0.03   ------------------------------------------------------------------------------------------------------------------  RADIOLOGY:  No results found.  EKG:   Orders placed or performed during the hospital encounter of 10/26/17  . ED EKG  . ED EKG  . EKG 12-Lead  . EKG 12-Lead    ASSESSMENT AND PLAN:   77 year old male with past medical history significant for bursitis, diabetes and hypertension presents to hospital secondary to fever and upper respiratory tract infection symptoms  1. Viral upper respiratory tract infection-off oxygen. -Likely has underlying COPD which has not been diagnosed officially. -edematous changes noted on CT chest. Also mixed groundglass changes with solid nodule in right mid to low at 7 mm. -We will need outpatient follow-up CT in 3-6 months -Currently on antibiotics-we'll  continue at discharge -Also will need steroids and  inhalers. -We will need pulmonary function tests as outpatient once he recovers from this.  2. Hypertension-continue home medications. Patient on quinapril  3. Diabetes mellitus-restart metformin and glimepiride at discharge  4. DVT prophylaxis-Lovenox     All the records are reviewed and case discussed with Care Management/Social Workerr. Management plans discussed with the patient, family and they are in agreement.  CODE STATUS: Full Code  TOTAL TIME TAKING CARE OF THIS PATIENT: 38 minutes.   POSSIBLE D/C IN 2 DAYS, DEPENDING ON CLINICAL CONDITION.   Gladstone Lighter M.D on 10/28/2017 at 12:35 PM  Between 7am to 6pm - Pager - [phone]  After 6pm go to [email or web address] - password [email or web address] [facility]  Office  [phone]  CC: Primary care physician; Adin Hector, MD

## 2017-10-28 NOTE — Progress Notes (Signed)
Pt AVS printed. Discharge instructions and prescriptions given. IVS removed. Patient questions answered. Discharged to home with daughter.

## 2017-10-30 NOTE — Discharge Summary (Signed)
[facility] [place] NAME: Jeremy Valencia    MR#:  [id number]  DATE OF BIRTH:  Aug 28, 1941  DATE OF ADMISSION:  10/26/2017   ADMITTING PHYSICIAN: Loletha Grayer, MD  DATE OF DISCHARGE: 10/28/2017  3:54 PM  PRIMARY CARE PHYSICIAN: Adin Hector, MD   ADMISSION DIAGNOSIS:   Cough [R05] Weakness [R53.1] SIRS (systemic inflammatory response syndrome) (HCC) [R65.10] Altered mental status, unspecified altered mental status type [R41.82]  DISCHARGE DIAGNOSIS:   Active Problems:   SIRS (systemic inflammatory response syndrome) ([facility])   SECONDARY DIAGNOSIS:   Past Medical History:  Diagnosis Date  . Bursitis 05/16/2013   BURSITIS/CAPSULITIS RT ANKLE  . Cancer ([place])    skin cancer  . 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 Diabetes mellitus without complication ([place])   . Hypertension   . Plantar fasciitis     HOSPITAL COURSE:   77 year old male with past medical history significant for bursitis, diabetes and hypertension presents to hospital secondary to fever and upper respiratory tract infection symptoms  1. Viral upper respiratory tract infection-off oxygen. -Likely has underlying COPD which has not been diagnosed officially. -some edematous changes noted on CT chest. Also mixed groundglass changes with solid nodule in right mid to lower lung at 7 mm. -We will need outpatient follow-up CT in 3-6 months -Currently on antibiotics-continue doxycycline at discharge -Also added prednisone and inhalers. Advised to follow-up as outpatient. -We will need pulmonary function tests as outpatient once he recovers from this.  2. Hypertension-continue home medications. Patient on quinapril  3. Diabetes mellitus-restart metformin and glimepiride at discharge   Has been ambulating well. Being discharged today. Daughter updated prior to discharge   DISCHARGE CONDITIONS:    Guarded  CONSULTS OBTAINED:    None  DRUG ALLERGIES:   Allergies  Allergen Reactions  . Rofecoxib Other (See Comments)    Other reaction(s): Unknown   DISCHARGE MEDICATIONS:   Allergies as of 10/28/2017      Reactions   Rofecoxib Other (See Comments)   Other reaction(s): Unknown      Medication List    STOP taking these medications   hydrochlorothiazide 25 MG tablet Commonly known as:  HYDRODIURIL     TAKE these medications   albuterol 108 (90 Base) MCG/ACT inhaler Commonly known as:  PROVENTIL HFA;VENTOLIN HFA Inhale 2 puffs into the lungs every 6 (six) hours as needed for wheezing or shortness of breath.   aspirin 81 MG tablet Take 81 mg by mouth daily.   atorvastatin 20 MG tablet Commonly known as:  LIPITOR Take 20 mg by mouth daily.   budesonide-formoterol 160-4.5 MCG/ACT inhaler Commonly known as:  SYMBICORT Inhale 2 puffs into the lungs 2 (two) times daily.   co-enzyme Q-10 50 MG capsule Take 50 mg by mouth as directed.   colchicine 0.6 MG tablet Take 0.6 mg by mouth as needed.   doxycycline 100 MG capsule Commonly known as:  VIBRAMYCIN Take 1 capsule (100 mg total) by mouth 2 (two) times daily for 7 days. X 7 days   Fish Oil 1000 MG Caps Take 3-4 capsules by mouth daily.   gabapentin 300 MG capsule Commonly known as:  NEURONTIN Take 300 mg by mouth 3 (three) times daily.   glimepiride 2 MG tablet Commonly known as:  AMARYL TAKE ONE TABLET BY MOUTH ONCE DAILY   metFORMIN 1000 MG tablet Commonly known as:  GLUCOPHAGE Take 1,000  mg by mouth 2 (two) times daily with a meal.   MULTI-VITAMINS Tabs Take 1 tablet by mouth daily.   predniSONE 20 MG tablet Commonly known as:  DELTASONE Take 2 tablets (40 mg total) by mouth daily with breakfast for 5 days. Take for 5 days and stop   quinapril 40 MG tablet Commonly known as:  ACCUPRIL Take 40 mg by mouth at bedtime.   vitamin C 1000 MG tablet Take 1,000 mg by mouth daily.   Vitamin D3  1000 units Caps Take 1,000 Units by mouth daily.   Zinc Acetate 50 MG Caps Take 1 capsule by mouth daily.        DISCHARGE INSTRUCTIONS:   1. PCP f/u in 1-2 weeks  DIET:   Cardiac diet  ACTIVITY:   Activity as tolerated  OXYGEN:   Home Oxygen: No.  Oxygen Delivery: room air  DISCHARGE LOCATION:   home   If you experience worsening of your admission symptoms, develop shortness of breath, life threatening emergency, suicidal or homicidal thoughts you must seek medical attention immediately by calling 911 or calling your MD immediately  if symptoms less severe.  You Must read complete instructions/literature along with all the possible adverse reactions/side effects for all the Medicines you take and that have been prescribed to you. Take any new Medicines after you have completely understood and accpet all the possible adverse reactions/side effects.   Please note  You were cared for by a hospitalist during your hospital stay. If you have any questions about your discharge medications or the care you received while you were in the hospital after you are discharged, you can call the unit and asked to speak with the hospitalist on call if the hospitalist that took care of you is not available. Once you are discharged, your primary care physician will handle any further medical issues. Please note that NO REFILLS for any discharge medications will be authorized once you are discharged, as it is imperative that you return to your primary care physician (or establish a relationship with a primary care physician if you do not have one) for your aftercare needs so that they can reassess your need for medications and monitor your lab values.    On the day of Discharge:  VITAL SIGNS:   Blood pressure 133/67, pulse 78, temperature 98.5 F (36.9 C), temperature source Oral, resp. rate 20, height 6' (1.829 m), weight 121.5 kg (267 lb 14.4 oz), SpO2 94 %.  PHYSICAL EXAMINATION:    GENERAL:  77 y.o.-year-old patient lying in the bed with no acute distress.  EYES: Pupils equal, round, reactive to light and accommodation. No scleral icterus. Extraocular muscles intact.  HEENT: Head atraumatic, normocephalic. Oropharynx and nasopharynx clear.  NECK:  Supple, no jugular venous distention. No thyroid enlargement, no tenderness.  LUNGS: Normal breath sounds bilaterally, mild scattered wheezing, no rales,rhonchi or crepitation. No use of accessory muscles of respiration.  CARDIOVASCULAR: S1, S2 normal. No murmurs, rubs, or gallops.  ABDOMEN: Soft, nontender, nondistended. Bowel sounds present. No organomegaly or mass.  EXTREMITIES: No pedal edema, cyanosis, or clubbing.  NEUROLOGIC: Cranial nerves II through XII are intact. Muscle strength 5/5 in all extremities. Sensation intact. Gait not checked.  PSYCHIATRIC: The patient is alert and oriented x 3.  SKIN: No obvious rash, lesion, or ulcer.      DATA REVIEW:   CBC Recent Labs  Lab 10/27/17 0449  WBC 13.3*  HGB 13.2  HCT 38.8*  PLT 209  Chemistries  Recent Labs  Lab 10/26/17 1024 10/27/17 0449  NA 137 139  K 3.8 3.8  CL 104 104  CO2 23 25  GLUCOSE 195* 177*  BUN 18 19  CREATININE 1.02 1.09  CALCIUM 8.5* 8.4*  AST 25  --   ALT 20  --   ALKPHOS 40  --   BILITOT 1.4*  --      Microbiology Results  Results for orders placed or performed during the hospital encounter of 10/26/17  Blood Culture (routine x 2)     Status: None (Preliminary result)   Collection Time: 10/26/17 10:24 AM  Result Value Ref Range Status   Specimen Description BLOOD BLOOD LEFT HAND  Final   Special Requests   Final    BOTTLES DRAWN AEROBIC AND ANAEROBIC Blood Culture adequate volume   Culture   Final    NO GROWTH 4 DAYS Performed at [facility], [address] [place], [place] [address]    Report Status PENDING  Incomplete  Blood Culture (routine x 2)     Status: None (Preliminary result)   Collection  Time: 10/26/17 10:24 AM  Result Value Ref Range Status   Specimen Description BLOOD BLOOD LEFT WRIST  Final   Special Requests   Final    BOTTLES DRAWN AEROBIC AND ANAEROBIC Blood Culture results may not be optimal due to an excessive volume of blood received in culture bottles   Culture   Final    NO GROWTH 4 DAYS Performed at [facility], [address], [place], [place] [address]    Report Status PENDING  Incomplete  Urine culture     Status: None   Collection Time: 10/26/17 11:40 AM  Result Value Ref Range Status   Specimen Description   Final    URINE, RANDOM Performed at [facility], [address] [place], [place] [address]    Special Requests   Final    NONE Performed at [facility], [address] [place], [place] [address]    Culture   Final    NO GROWTH Performed at [facility], [place] [address] [place],  [address]    Report Status 10/27/2017 FINAL  Final    RADIOLOGY:  No results found.   Management plans discussed with the patient, family and they are in agreement.  CODE STATUS:  Code Status History    Date Active Date Inactive Code Status Order ID Comments User Context   10/26/2017 13:46 10/28/2017 18:54 Full Code [id number]  Loletha Grayer, MD ED    Advance Directive Documentation     Most Recent Value  Type of Advance Directive  Healthcare Power of [place], Living will  Pre-existing out of facility DNR order (yellow form or pink MOST form)  No data  "MOST" Form in Place?  No data      TOTAL TIME TAKING CARE OF THIS PATIENT: 38 minutes.    Andy Moye M.D on 10/30/2017 at 1:13 PM  Between 7am to 6pm - Pager - [phone]  After 6pm go to [email or web address] - password [email or web address] [facility]  [facility]  Office  [phone]  CC: Primary care physician; Adin Hector, MD   Note: This dictation was prepared with Dragon dictation along with smaller phrase technology. Any  transcriptional errors that result from this process are unintentional.

## 2017-10-31 LAB — CULTURE, BLOOD (ROUTINE X 2)
Culture: NO GROWTH
Culture: NO GROWTH
Special Requests: ADEQUATE

## 2017-11-08 ENCOUNTER — Other Ambulatory Visit: Payer: Self-pay | Admitting: Family Medicine

## 2017-11-08 DIAGNOSIS — R911 Solitary pulmonary nodule: Secondary | ICD-10-CM

## 2017-11-10 ENCOUNTER — Encounter: Payer: Self-pay | Admitting: Podiatry

## 2017-11-10 ENCOUNTER — Ambulatory Visit (INDEPENDENT_AMBULATORY_CARE_PROVIDER_SITE_OTHER): Payer: PRIVATE HEALTH INSURANCE | Admitting: Podiatry

## 2017-11-10 DIAGNOSIS — L03032 Cellulitis of left toe: Secondary | ICD-10-CM

## 2017-11-10 DIAGNOSIS — L02612 Cutaneous abscess of left foot: Secondary | ICD-10-CM

## 2017-11-10 MED ORDER — CLOTRIMAZOLE-BETAMETHASONE 1-0.05 % EX CREA: 1.0000 "application " | TOPICAL_CREAM | Freq: Two times a day (BID) | CUTANEOUS | 0 refills | Status: DC

## 2017-11-10 MED ORDER — TERBINAFINE HCL 250 MG PO TABS: 250.0000 mg | ORAL_TABLET | Freq: Every day | ORAL | 0 refills | Status: DC

## 2017-11-10 NOTE — Progress Notes (Signed)
   HPI: 77 year old male well established with the practice presents today for reoccurrence of a dermatitis to the bilateral forefoot interdigital areas.  He stated that the areas were healed however they began itching severely and he scratched the area and developed blister lesions with maceration between the toes.  Patient has been treated with oral antibiotics in the past, and he is unsure if they alleviated any symptoms since the condition immediately recurred.  He presents today for further treatment and evaluation  Past Medical History:  Diagnosis Date  . Bursitis 05/16/2013   BURSITIS/CAPSULITIS RT ANKLE  . Cancer ([place])    skin cancer  . 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 Diabetes mellitus without complication ([place])   . Hypertension   . Plantar fasciitis      Physical Exam: General: The patient is alert and oriented x3 in no acute distress.  Dermatology: Interdigital maceration noted in between the digits 1-5 bilateral with pruritus and pinpoint vesicles.  There is also a dorsal blister lesion noted to the dorsum of the right great toe.  Blister is intact.  No significant malodor noted.  Vascular: Palpable pedal pulses bilaterally.  Mild edema with erythema localized to the digits bilateral.  Capillary refill within normal limits.  Neurological: Epicritic and protective threshold grossly intact bilaterally.   Musculoskeletal Exam: Range of motion within normal limits to all pedal and ankle joints bilateral. Muscle strength 5/5 in all groups bilateral.   Assessment: -Tinea pedis/athlete's foot bilateral   Plan of Care:  - Patient evaluated.  -Given the patient's presenting symptoms of severe itching with maceration in between the toes eyes highly suspect acute onset of athlete's foot. -Prescription for Lamisil 250 mg #28 daily -Prescription for Lotrisone cream to be applied interdigitally twice daily -Instructed the patient keep  his feet dry and clean practice good hygiene to the interdigital areas -Continue wearing good supportive shoe gear -Antibiotic ointment and Band-Aid was applied to the open more severe areas of the bilateral digits. -The intact blister lesion to the dorsum of the right great toe was drained and the removed and cultures were taken to rule out any bacterial infection -Return to clinic in 1 week    Edrick Kins, DPM [facility]  Dr. Edrick Kins, DPM    [address],  [address]                Office [phone]  Fax [phone]    I think I will go and enjoy your meeting sorry are late in with her to like 7:00 Oswaldo Milian

## 2017-11-13 LAB — WOUND CULTURE: Organism ID, Bacteria: NONE SEEN

## 2017-11-17 ENCOUNTER — Ambulatory Visit (INDEPENDENT_AMBULATORY_CARE_PROVIDER_SITE_OTHER): Payer: Medicare Other | Admitting: Podiatry

## 2017-11-17 ENCOUNTER — Encounter: Payer: Self-pay | Admitting: Podiatry

## 2017-11-17 DIAGNOSIS — B353 Tinea pedis: Secondary | ICD-10-CM | POA: Diagnosis not present

## 2017-11-17 NOTE — Progress Notes (Signed)
   HPI: 77-year-old male well established with the practice presents for follow up evaluation of tinea pedis of bilateral feet. He states the rash has improved. He has been using the Lotrisone as directed. He states he tried taking the Lamisil but it caused a rash to the bilateral upper extremities. Patient is here for further evaluation and treatment.   Past Medical History:  Diagnosis Date  . Bursitis 05/16/2013   BURSITIS/CAPSULITIS RT ANKLE  . Cancer ([facility])    skin cancer  . 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 Diabetes mellitus without complication ([facility])   . Hypertension   . Plantar fasciitis      Physical Exam: General: The patient is alert and oriented x3 in no acute distress.  Dermatology: Interdigital maceration noted in between the digits 1-5 bilateral with pruritus and pinpoint vesicles.  There is also a dorsal blister lesion noted to the dorsum of the right great toe.  Blister is intact.  No significant malodor noted.  Vascular: Palpable pedal pulses bilaterally.  Mild edema with erythema localized to the digits bilateral.  Capillary refill within normal limits.  Neurological: Epicritic and protective threshold grossly intact bilaterally.   Musculoskeletal Exam: Range of motion within normal limits to all pedal and ankle joints bilateral. Muscle strength 5/5 in all groups bilateral.   Assessment: -Tinea pedis/athlete's foot bilateral - improved   Plan of Care:  - Patient evaluated.  - Cultures reviewed and are negative for bacterial infection.  - Continue using Lotrisone cream as needed.  - Recommended good shoe gear.  - Return to clinic as needed.     Brent M. Evans, DPM [facility]  Dr. Brent M. Evans, DPM    [address]                                        [place], [place] [address]                Office [phone]  Fax [phone]    I think I will go and enjoy your meeting sorry  are late in with her to like 7:00 Isaiah 

## 2018-01-18 DIAGNOSIS — G250 Essential tremor: Secondary | ICD-10-CM | POA: Insufficient documentation

## 2018-01-18 DIAGNOSIS — G4700 Insomnia, unspecified: Secondary | ICD-10-CM | POA: Insufficient documentation

## 2018-01-18 DIAGNOSIS — R251 Tremor, unspecified: Secondary | ICD-10-CM | POA: Insufficient documentation

## 2018-01-22 DIAGNOSIS — J438 Other emphysema: Secondary | ICD-10-CM | POA: Insufficient documentation

## 2018-02-08 ENCOUNTER — Ambulatory Visit
Admission: RE | Admit: 2018-02-08 | Discharge: 2018-02-08 | Disposition: A | Discharge status: Home / Self Care | Payer: Medicare Other | Source: Ambulatory Visit | Attending: Family Medicine | Admitting: Family Medicine

## 2018-02-08 DIAGNOSIS — I700 Atherosclerosis of aorta: Secondary | ICD-10-CM | POA: Insufficient documentation

## 2018-02-08 DIAGNOSIS — J439 Emphysema, unspecified: Secondary | ICD-10-CM | POA: Insufficient documentation

## 2018-02-08 DIAGNOSIS — I2510 Atherosclerotic heart disease of native coronary artery without angina pectoris: Secondary | ICD-10-CM | POA: Insufficient documentation

## 2018-02-08 DIAGNOSIS — R918 Other nonspecific abnormal finding of lung field: Secondary | ICD-10-CM | POA: Diagnosis not present

## 2018-02-08 DIAGNOSIS — R911 Solitary pulmonary nodule: Secondary | ICD-10-CM

## 2018-04-02 ENCOUNTER — Other Ambulatory Visit: Payer: Self-pay | Admitting: Podiatry

## 2019-07-05 ENCOUNTER — Other Ambulatory Visit: Payer: Self-pay | Admitting: Podiatry

## 2019-08-16 ENCOUNTER — Other Ambulatory Visit: Payer: Self-pay

## 2019-08-16 DIAGNOSIS — Z20822 Contact with and (suspected) exposure to covid-19: Secondary | ICD-10-CM

## 2019-08-18 LAB — NOVEL CORONAVIRUS, NAA: SARS-CoV-2, NAA: NOT DETECTED

## 2019-09-04 ENCOUNTER — Encounter: Payer: Self-pay | Admitting: Podiatry

## 2019-09-04 ENCOUNTER — Ambulatory Visit (INDEPENDENT_AMBULATORY_CARE_PROVIDER_SITE_OTHER): Payer: Medicare Other

## 2019-09-04 ENCOUNTER — Other Ambulatory Visit: Payer: Self-pay | Admitting: Podiatry

## 2019-09-04 ENCOUNTER — Ambulatory Visit (INDEPENDENT_AMBULATORY_CARE_PROVIDER_SITE_OTHER): Payer: Medicare Other | Admitting: Podiatry

## 2019-09-04 ENCOUNTER — Other Ambulatory Visit: Payer: Self-pay

## 2019-09-04 DIAGNOSIS — M722 Plantar fascial fibromatosis: Secondary | ICD-10-CM | POA: Diagnosis not present

## 2019-09-04 DIAGNOSIS — M7750 Other enthesopathy of unspecified foot: Secondary | ICD-10-CM

## 2019-09-04 NOTE — Progress Notes (Signed)
He presents today chief complaint of pain to the right heel times the past couple of months he states that is been painful along grandmom left foot and it seems to come and go.  He states that he is not done anything for treatment.  Objective: Vital signs are stable alert oriented x3.  Pulses are palpable.  He has pain on palpation medial calcaneal tubercle right heel.  Radiographs confirm plantar distally oriented calcaneal heel spur with soft tissue increase in density at the plantar fascial calcaneal insertion site no fractures are identified.  No acute findings.  Assessment: Plantar fasciitis right.  Plan: I injected the area today after sterile Betadine skin prep 20 mg Kenalog 5 mg Marcaine point of maximal tenderness.  Tolerated procedure well without complications.  Follow-up with him in 6 months

## 2019-10-07 ENCOUNTER — Other Ambulatory Visit: Payer: Self-pay | Admitting: Podiatry

## 2020-01-02 ENCOUNTER — Other Ambulatory Visit: Payer: Self-pay | Admitting: Podiatry

## 2020-03-04 ENCOUNTER — Ambulatory Visit (INDEPENDENT_AMBULATORY_CARE_PROVIDER_SITE_OTHER): Payer: Medicare Other | Admitting: Podiatry

## 2020-03-04 ENCOUNTER — Other Ambulatory Visit: Payer: Self-pay

## 2020-03-04 ENCOUNTER — Encounter: Payer: Self-pay | Admitting: Podiatry

## 2020-03-04 DIAGNOSIS — M7671 Peroneal tendinitis, right leg: Secondary | ICD-10-CM | POA: Diagnosis not present

## 2020-03-04 DIAGNOSIS — M79676 Pain in unspecified toe(s): Secondary | ICD-10-CM

## 2020-03-04 DIAGNOSIS — B351 Tinea unguium: Secondary | ICD-10-CM | POA: Diagnosis not present

## 2020-03-04 MED ORDER — GABAPENTIN 300 MG PO CAPS: 300.0000 mg | ORAL_CAPSULE | Freq: Three times a day (TID) | ORAL | 6 refills | Status: DC

## 2020-03-04 NOTE — Progress Notes (Signed)
He presents today chief complaint of painful lateral ankle right and elongated toenails.  Objective: Pulses remain palpable bilateral.  Minimal edema.  He has some fluctuance in the peroneal tendon sheath just beneath the lateral malleolus on the right foot.  Mild tenderness here.  No erythema cellulitis drainage odor no signs of infection.  Toenails are long thick yellow dystrophic-like mycotic.  Assessment: Pain in limb secondary to onychomycosis.  Peroneal tendinitis right foot only.  Plan: Discussed etiology pathology and surgical therapies at this point I went ahead and injected 10 mg Kenalog 5 mg Marcaine point of maximal tenderness along the peroneal tendons right.  3 to toenails 1 through 5 of the right foot.  Follow-up with him in 6 months sooner if necessary.

## 2020-03-20 ENCOUNTER — Other Ambulatory Visit: Payer: Self-pay | Admitting: Podiatry

## 2020-07-05 ENCOUNTER — Other Ambulatory Visit: Payer: Self-pay | Admitting: Podiatry

## 2020-07-05 NOTE — Telephone Encounter (Signed)
Please advise 

## 2020-09-06 DIAGNOSIS — Z20822 Contact with and (suspected) exposure to covid-19: Secondary | ICD-10-CM | POA: Diagnosis not present

## 2020-09-07 ENCOUNTER — Encounter: Payer: Medicare Other | Admitting: Podiatry

## 2020-10-14 ENCOUNTER — Other Ambulatory Visit: Payer: Self-pay | Admitting: Podiatry

## 2020-10-14 NOTE — Telephone Encounter (Signed)
Please advise 

## 2020-10-19 ENCOUNTER — Other Ambulatory Visit: Payer: Self-pay

## 2020-10-19 ENCOUNTER — Ambulatory Visit (INDEPENDENT_AMBULATORY_CARE_PROVIDER_SITE_OTHER): Payer: Medicare HMO | Admitting: Podiatry

## 2020-10-19 DIAGNOSIS — M7671 Peroneal tendinitis, right leg: Secondary | ICD-10-CM | POA: Diagnosis not present

## 2020-10-19 DIAGNOSIS — M7751 Other enthesopathy of right foot: Secondary | ICD-10-CM | POA: Diagnosis not present

## 2020-10-19 MED ORDER — TRIAMCINOLONE ACETONIDE 40 MG/ML IJ SUSP
40.0000 mg | Freq: Once | INTRAMUSCULAR | Status: AC
  Administered 2020-10-19: 40 mg

## 2020-10-19 NOTE — Progress Notes (Signed)
He presents today for follow-up of his peroneal tendinitis states that is still bothering him we have not seen him since June of last year.  States that he was doing good for quite some time and has just recently started to become more of a nuisance.  He was scheduled for January but had Covid.  He states that he is feeling much better now as far as that goes however his ankle still hurts.  Objective: Vital signs are stable he is alert oriented x3 there is no erythema cellulitis drainage odor has tenderness on palpation along the peroneal tendons and he does have pain on palpation of the sinus tarsi and on end range of motion of the subtalar joint right foot.  Ulcers remain palpable no open lesions or wounds.  Assessment: Capsulitis osteoarthritis subtalar joint right peroneal tendinitis right.  Plan: Discussed etiology pathology conservative versus surgical therapies.  At this point I injected the sinus tarsi today with 10 mg of Kenalog as well as the peroneal tendon sheath.  He tolerated procedure well without complications and I will follow-up with him in about 6 months he will call sooner if needed.

## 2020-10-26 DIAGNOSIS — I10 Essential (primary) hypertension: Secondary | ICD-10-CM | POA: Diagnosis not present

## 2020-10-26 DIAGNOSIS — E118 Type 2 diabetes mellitus with unspecified complications: Secondary | ICD-10-CM | POA: Diagnosis not present

## 2020-11-02 DIAGNOSIS — D692 Other nonthrombocytopenic purpura: Secondary | ICD-10-CM | POA: Diagnosis not present

## 2020-11-02 DIAGNOSIS — K219 Gastro-esophageal reflux disease without esophagitis: Secondary | ICD-10-CM | POA: Diagnosis not present

## 2020-11-02 DIAGNOSIS — E1129 Type 2 diabetes mellitus with other diabetic kidney complication: Secondary | ICD-10-CM | POA: Diagnosis not present

## 2020-11-02 DIAGNOSIS — I10 Essential (primary) hypertension: Secondary | ICD-10-CM | POA: Diagnosis not present

## 2020-11-02 DIAGNOSIS — E1142 Type 2 diabetes mellitus with diabetic polyneuropathy: Secondary | ICD-10-CM | POA: Diagnosis not present

## 2020-11-02 DIAGNOSIS — J438 Other emphysema: Secondary | ICD-10-CM | POA: Diagnosis not present

## 2020-11-02 DIAGNOSIS — E785 Hyperlipidemia, unspecified: Secondary | ICD-10-CM | POA: Diagnosis not present

## 2020-11-02 DIAGNOSIS — G250 Essential tremor: Secondary | ICD-10-CM | POA: Diagnosis not present

## 2020-12-17 DIAGNOSIS — E1140 Type 2 diabetes mellitus with diabetic neuropathy, unspecified: Secondary | ICD-10-CM | POA: Diagnosis not present

## 2020-12-17 DIAGNOSIS — R809 Proteinuria, unspecified: Secondary | ICD-10-CM | POA: Diagnosis not present

## 2020-12-17 DIAGNOSIS — E1129 Type 2 diabetes mellitus with other diabetic kidney complication: Secondary | ICD-10-CM | POA: Diagnosis not present

## 2020-12-17 DIAGNOSIS — K625 Hemorrhage of anus and rectum: Secondary | ICD-10-CM | POA: Diagnosis not present

## 2020-12-17 DIAGNOSIS — I10 Essential (primary) hypertension: Secondary | ICD-10-CM | POA: Diagnosis not present

## 2020-12-17 DIAGNOSIS — K219 Gastro-esophageal reflux disease without esophagitis: Secondary | ICD-10-CM | POA: Diagnosis not present

## 2021-01-07 DIAGNOSIS — K625 Hemorrhage of anus and rectum: Secondary | ICD-10-CM | POA: Diagnosis not present

## 2021-01-14 ENCOUNTER — Other Ambulatory Visit: Payer: Self-pay | Admitting: Podiatry

## 2021-01-21 DIAGNOSIS — I10 Essential (primary) hypertension: Secondary | ICD-10-CM | POA: Diagnosis not present

## 2021-01-21 DIAGNOSIS — E118 Type 2 diabetes mellitus with unspecified complications: Secondary | ICD-10-CM | POA: Diagnosis not present

## 2021-01-21 DIAGNOSIS — E785 Hyperlipidemia, unspecified: Secondary | ICD-10-CM | POA: Diagnosis not present

## 2021-01-27 DIAGNOSIS — D692 Other nonthrombocytopenic purpura: Secondary | ICD-10-CM | POA: Diagnosis not present

## 2021-01-27 DIAGNOSIS — E785 Hyperlipidemia, unspecified: Secondary | ICD-10-CM | POA: Diagnosis not present

## 2021-01-27 DIAGNOSIS — E1140 Type 2 diabetes mellitus with diabetic neuropathy, unspecified: Secondary | ICD-10-CM | POA: Diagnosis not present

## 2021-01-27 DIAGNOSIS — E118 Type 2 diabetes mellitus with unspecified complications: Secondary | ICD-10-CM | POA: Diagnosis not present

## 2021-01-27 DIAGNOSIS — R809 Proteinuria, unspecified: Secondary | ICD-10-CM | POA: Diagnosis not present

## 2021-01-27 DIAGNOSIS — K219 Gastro-esophageal reflux disease without esophagitis: Secondary | ICD-10-CM | POA: Diagnosis not present

## 2021-01-27 DIAGNOSIS — E1129 Type 2 diabetes mellitus with other diabetic kidney complication: Secondary | ICD-10-CM | POA: Diagnosis not present

## 2021-01-27 DIAGNOSIS — J449 Chronic obstructive pulmonary disease, unspecified: Secondary | ICD-10-CM | POA: Diagnosis not present

## 2021-01-27 DIAGNOSIS — I10 Essential (primary) hypertension: Secondary | ICD-10-CM | POA: Diagnosis not present

## 2021-02-11 DIAGNOSIS — X32XXXA Exposure to sunlight, initial encounter: Secondary | ICD-10-CM | POA: Diagnosis not present

## 2021-02-11 DIAGNOSIS — Z85820 Personal history of malignant melanoma of skin: Secondary | ICD-10-CM | POA: Diagnosis not present

## 2021-02-11 DIAGNOSIS — D2261 Melanocytic nevi of right upper limb, including shoulder: Secondary | ICD-10-CM | POA: Diagnosis not present

## 2021-02-11 DIAGNOSIS — D225 Melanocytic nevi of trunk: Secondary | ICD-10-CM | POA: Diagnosis not present

## 2021-02-11 DIAGNOSIS — L570 Actinic keratosis: Secondary | ICD-10-CM | POA: Diagnosis not present

## 2021-02-11 DIAGNOSIS — Z85828 Personal history of other malignant neoplasm of skin: Secondary | ICD-10-CM | POA: Diagnosis not present

## 2021-02-11 DIAGNOSIS — D2271 Melanocytic nevi of right lower limb, including hip: Secondary | ICD-10-CM | POA: Diagnosis not present

## 2021-03-12 DIAGNOSIS — H40002 Preglaucoma, unspecified, left eye: Secondary | ICD-10-CM | POA: Diagnosis not present

## 2021-03-14 DIAGNOSIS — Z20822 Contact with and (suspected) exposure to covid-19: Secondary | ICD-10-CM | POA: Diagnosis not present

## 2021-03-18 DIAGNOSIS — J439 Emphysema, unspecified: Secondary | ICD-10-CM | POA: Diagnosis not present

## 2021-03-18 DIAGNOSIS — U071 COVID-19: Secondary | ICD-10-CM | POA: Diagnosis not present

## 2021-03-18 DIAGNOSIS — J208 Acute bronchitis due to other specified organisms: Secondary | ICD-10-CM | POA: Diagnosis not present

## 2021-03-18 DIAGNOSIS — E119 Type 2 diabetes mellitus without complications: Secondary | ICD-10-CM | POA: Diagnosis not present

## 2021-04-02 DIAGNOSIS — E113292 Type 2 diabetes mellitus with mild nonproliferative diabetic retinopathy without macular edema, left eye: Secondary | ICD-10-CM | POA: Diagnosis not present

## 2021-04-06 ENCOUNTER — Other Ambulatory Visit: Payer: Self-pay

## 2021-04-06 ENCOUNTER — Ambulatory Visit: Payer: Medicare HMO | Admitting: Anesthesiology

## 2021-04-06 ENCOUNTER — Encounter
Admission: RE | Disposition: A | Discharge status: Home / Self Care | Payer: Self-pay | Source: Home / Self Care | Attending: Gastroenterology

## 2021-04-06 ENCOUNTER — Encounter: Payer: Self-pay | Admitting: *Deleted

## 2021-04-06 ENCOUNTER — Ambulatory Visit
Admission: RE | Admit: 2021-04-06 | Discharge: 2021-04-06 | Disposition: A | Discharge status: Home / Self Care | Payer: Medicare HMO | Attending: Gastroenterology | Admitting: Gastroenterology

## 2021-04-06 DIAGNOSIS — K640 First degree hemorrhoids: Secondary | ICD-10-CM | POA: Insufficient documentation

## 2021-04-06 DIAGNOSIS — D122 Benign neoplasm of ascending colon: Secondary | ICD-10-CM | POA: Diagnosis not present

## 2021-04-06 DIAGNOSIS — D126 Benign neoplasm of colon, unspecified: Secondary | ICD-10-CM | POA: Diagnosis not present

## 2021-04-06 DIAGNOSIS — Z7951 Long term (current) use of inhaled steroids: Secondary | ICD-10-CM | POA: Diagnosis not present

## 2021-04-06 DIAGNOSIS — K5730 Diverticulosis of large intestine without perforation or abscess without bleeding: Secondary | ICD-10-CM | POA: Diagnosis not present

## 2021-04-06 DIAGNOSIS — Z87891 Personal history of nicotine dependence: Secondary | ICD-10-CM | POA: Insufficient documentation

## 2021-04-06 DIAGNOSIS — D123 Benign neoplasm of transverse colon: Secondary | ICD-10-CM | POA: Diagnosis not present

## 2021-04-06 DIAGNOSIS — K625 Hemorrhage of anus and rectum: Secondary | ICD-10-CM | POA: Insufficient documentation

## 2021-04-06 DIAGNOSIS — K635 Polyp of colon: Secondary | ICD-10-CM | POA: Diagnosis not present

## 2021-04-06 DIAGNOSIS — E119 Type 2 diabetes mellitus without complications: Secondary | ICD-10-CM | POA: Insufficient documentation

## 2021-04-06 DIAGNOSIS — Z888 Allergy status to other drugs, medicaments and biological substances status: Secondary | ICD-10-CM | POA: Diagnosis not present

## 2021-04-06 DIAGNOSIS — Z883 Allergy status to other anti-infective agents status: Secondary | ICD-10-CM | POA: Insufficient documentation

## 2021-04-06 DIAGNOSIS — Z7984 Long term (current) use of oral hypoglycemic drugs: Secondary | ICD-10-CM | POA: Insufficient documentation

## 2021-04-06 DIAGNOSIS — I10 Essential (primary) hypertension: Secondary | ICD-10-CM | POA: Insufficient documentation

## 2021-04-06 DIAGNOSIS — Z7982 Long term (current) use of aspirin: Secondary | ICD-10-CM | POA: Insufficient documentation

## 2021-04-06 DIAGNOSIS — K649 Unspecified hemorrhoids: Secondary | ICD-10-CM | POA: Diagnosis not present

## 2021-04-06 DIAGNOSIS — Z79899 Other long term (current) drug therapy: Secondary | ICD-10-CM | POA: Insufficient documentation

## 2021-04-06 HISTORY — DX: Gastro-esophageal reflux disease without esophagitis: K21.9

## 2021-04-06 HISTORY — DX: Diverticulosis of intestine, part unspecified, without perforation or abscess without bleeding: K57.90

## 2021-04-06 HISTORY — DX: Benign prostatic hyperplasia without lower urinary tract symptoms: N40.0

## 2021-04-06 HISTORY — DX: Emphysema, unspecified: J43.9

## 2021-04-06 HISTORY — PX: COLONOSCOPY WITH PROPOFOL: SHX5780

## 2021-04-06 HISTORY — DX: Hyperlipidemia, unspecified: E78.5

## 2021-04-06 HISTORY — DX: Unspecified osteoarthritis, unspecified site: M19.90

## 2021-04-06 LAB — GLUCOSE, CAPILLARY: Glucose-Capillary: 100 mg/dL — ABNORMAL HIGH (ref 70–99)

## 2021-04-06 SURGERY — COLONOSCOPY WITH PROPOFOL
Anesthesia: General

## 2021-04-06 MED ORDER — LIDOCAINE HCL (CARDIAC) PF 100 MG/5ML IV SOSY
PREFILLED_SYRINGE | INTRAVENOUS | Status: DC | PRN
  Administered 2021-04-06: 100 mg via INTRAVENOUS

## 2021-04-06 MED ORDER — PROPOFOL 10 MG/ML IV BOLUS
INTRAVENOUS | Status: DC | PRN
Start: 2021-04-06 — End: 2021-04-06
  Administered 2021-04-06: 50 mg via INTRAVENOUS

## 2021-04-06 MED ORDER — SODIUM CHLORIDE 0.9 % IV SOLN: INTRAVENOUS | Status: DC

## 2021-04-06 MED ORDER — PROPOFOL 500 MG/50ML IV EMUL
INTRAVENOUS | Status: DC | PRN
  Administered 2021-04-06: 145 ug/kg/min via INTRAVENOUS

## 2021-04-06 NOTE — H&P (Signed)
Outpatient short stay form Pre-procedure 04/06/2021 8:13 AM Jeremy Miyamoto MD, MPH  Primary Physician: Dr. Caryl Comes  Reason for visit:  Rectal Bleeding  History of present illness:   80 y/o gentleman with history of DM II and hypertension here for colonoscopy for rectal bleeding. Had colonoscopy in 2014 with polyp of unknown type. No family history of GI malignancies. No blood thinners. History of appendectomy.    Current Facility-Administered Medications:    0.9 %  sodium chloride infusion, , Intravenous, Continuous, Akaash Vandewater, Hilton Cork, MD, Last Rate: 20 mL/hr at 04/06/21 0749, New Bag at 04/06/21 0749  Medications Prior to Admission  Medication Sig Dispense Refill Last Dose   albuterol (PROVENTIL HFA;VENTOLIN HFA) 108 (90 Base) MCG/ACT inhaler Inhale 2 puffs into the lungs every 6 (six) hours as needed for wheezing or shortness of breath. 1 Inhaler 2 Past Month   Ascorbic Acid (VITAMIN C) 1000 MG tablet Take 1,000 mg by mouth daily.   Past Week   aspirin 81 MG tablet Take 81 mg by mouth daily.   Past Week   atorvastatin (LIPITOR) 20 MG tablet Take 20 mg by mouth daily.   Past Week   budesonide-formoterol (SYMBICORT) 160-4.5 MCG/ACT inhaler Inhale 2 puffs into the lungs 2 (two) times daily. 1 Inhaler 1 Past Month   Cholecalciferol (VITAMIN D3) 1000 units CAPS Take 1,000 Units by mouth daily.    Past Week   clotrimazole-betamethasone (LOTRISONE) cream Apply 1 application topically 2 (two) times daily. 30 g 0 Past Month   co-enzyme Q-10 50 MG capsule Take 50 mg by mouth as directed.   Past Week   colchicine 0.6 MG tablet Take 0.6 mg by mouth as needed.    Past Month   gabapentin (NEURONTIN) 300 MG capsule TAKE 1 CAPSULE BY MOUTH THREE TIMES DAILY 270 capsule 0 Past Week   glimepiride (AMARYL) 2 MG tablet TAKE ONE TABLET BY MOUTH ONCE DAILY   04/05/2021   hydrochlorothiazide (HYDRODIURIL) 25 MG tablet Take by mouth.   04/05/2021   metFORMIN (GLUCOPHAGE) 1000 MG tablet Take 1,000 mg by mouth 2  (two) times daily with a meal.   Past Month   Multiple Vitamin (MULTI-VITAMINS) TABS Take 1 tablet by mouth daily.    Past Week   Omega-3 Fatty Acids (FISH OIL) 1000 MG CAPS Take 3-4 capsules by mouth daily.    Past Week   quinapril (ACCUPRIL) 40 MG tablet Take 40 mg by mouth at bedtime.    Past Week   terbinafine (LAMISIL) 250 MG tablet Take 1 tablet (250 mg total) by mouth daily. 28 tablet 0 Past Week   Zinc Acetate 50 MG CAPS Take 1 capsule by mouth daily.    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    Physical Exam  Gen: Alert, oriented. Appears stated age.  HEENT: PERRLA. Lungs: No respiratory distress CV: RRR Abd: soft, benign, no masses Ext: No edema    Planned procedures: Proceed with colonoscopy. The patient understands the nature of the planned procedure, indications, risks, alternatives and potential complications including but not limited to bleeding, infection, perforation, damage to internal organs and possible oversedation/side effects from anesthesia. The patient agrees and gives consent to proceed.  Please refer to procedure notes for findings, recommendations and patient disposition/instructions.     Jeremy Miyamoto MD, MPH Gastroenterology 04/06/2021  8:13 AM

## 2021-04-06 NOTE — Anesthesia Preprocedure Evaluation (Signed)
Anesthesia Evaluation  Patient identified by MRN, date of birth, ID band Patient awake    Reviewed: Allergy & Precautions, H&P , NPO status , Patient's Chart, lab work & pertinent test results, reviewed documented beta blocker date and time   Airway Mallampati: II  TM Distance: >3 FB Neck ROM: full    Dental  (+) Dental Advidsory Given, Partial Upper, Teeth Intact   Pulmonary neg shortness of breath, COPD, neg recent URI, former smoker,    Pulmonary exam normal breath sounds clear to auscultation       Cardiovascular Exercise Tolerance: Good hypertension, (-) angina+ Peripheral Vascular Disease  (-) Past MI and (-) Cardiac Stents Normal cardiovascular exam(-) dysrhythmias (-) Valvular Problems/Murmurs Rhythm:regular Rate:Normal     Neuro/Psych negative neurological ROS  negative psych ROS   GI/Hepatic Neg liver ROS, GERD  ,  Endo/Other  diabetes  Renal/GU negative Renal ROS  negative genitourinary   Musculoskeletal   Abdominal   Peds  Hematology negative hematology ROS (+)   Anesthesia Other Findings Past Medical History: No date: Arthritis No date: BPH (benign prostatic hyperplasia) 05/16/2013: Bursitis     Comment:  BURSITIS/CAPSULITIS RT ANKLE No date: Cancer [facility])     Comment:  skin cancer 01/21/2013: Capsulitis of right foot     Comment:  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  No date: Diabetes mellitus without complication ([facility]) No date: Diverticulosis No date: Emphysema, unspecified ([facility]) No date: GERD (gastroesophageal reflux disease) No date: Hyperlipidemia No date: Hypertension No date: Plantar fasciitis   Reproductive/Obstetrics negative OB ROS                             Anesthesia Physical Anesthesia Plan  ASA: 2  Anesthesia Plan: General   Post-op Pain Management:    Induction: Intravenous  PONV Risk Score and Plan: 2 and TIVA and Propofol  infusion  Airway Management Planned: Natural Airway and Nasal Cannula  Additional Equipment:   Intra-op Plan:   Post-operative Plan:   Informed Consent: I have reviewed the patients History and Physical, chart, labs and discussed the procedure including the risks, benefits and alternatives for the proposed anesthesia with the patient or authorized representative who has indicated his/her understanding and acceptance.     Dental Advisory Given  Plan Discussed with: Anesthesiologist, CRNA and Surgeon  Anesthesia Plan Comments:         Anesthesia Quick Evaluation

## 2021-04-06 NOTE — Transfer of Care (Signed)
Immediate Anesthesia Transfer of Care Note  Patient: ZIMIR STENBERG  Procedure(s) Performed: COLONOSCOPY WITH PROPOFOL  Patient Location: Endoscopy Unit  Anesthesia Type:General  Level of Consciousness: drowsy and patient cooperative  Airway & Oxygen Therapy: Patient Spontanous Breathing and Patient connected to face mask oxygen  Post-op Assessment: Report given to RN and Post -op Vital signs reviewed and stable  Post vital signs: Reviewed and stable  Last Vitals:  Vitals Value Taken Time  BP 126/66 04/06/21 0855  Temp 36.1 C 04/06/21 0855  Pulse 65 04/06/21 0857  Resp 14 04/06/21 0857  SpO2 100 % 04/06/21 0857  Vitals shown include unvalidated device data.  Last Pain:  Vitals:   04/06/21 0855  TempSrc: Temporal  PainSc: Asleep         Complications: No notable events documented.

## 2021-04-06 NOTE — Interval H&P Note (Signed)
History and Physical Interval Note:  04/06/2021 8:15 AM  Jeremy Valencia  has presented today for surgery, with the diagnosis of RECTAL BLEEDING.  The various methods of treatment have been discussed with the patient and family. After consideration of risks, benefits and other options for treatment, the patient has consented to  Procedure(s) with comments: COLONOSCOPY WITH PROPOFOL (N/A) - DM as a surgical intervention.  The patient's history has been reviewed, patient examined, no change in status, stable for surgery.  I have reviewed the patient's chart and labs.  Questions were answered to the patient's satisfaction.     Lesly Rubenstein  Ok to proceed with colonoscopy

## 2021-04-06 NOTE — Anesthesia Postprocedure Evaluation (Signed)
Anesthesia Post Note  Patient: EMARI SIBBETT  Procedure(s) Performed: COLONOSCOPY WITH PROPOFOL  Patient location during evaluation: PACU Anesthesia Type: General Level of consciousness: awake and alert Pain management: pain level controlled Vital Signs Assessment: post-procedure vital signs reviewed and stable Respiratory status: spontaneous breathing, nonlabored ventilation, respiratory function stable and patient connected to nasal cannula oxygen Cardiovascular status: blood pressure returned to baseline and stable Postop Assessment: no apparent nausea or vomiting Anesthetic complications: no   No notable events documented.   Last Vitals:  Vitals:   04/06/21 0905 04/06/21 0916  BP: 123/77 (!) 148/74  Pulse: 66 65  Resp: 12 15  Temp:    SpO2:      Last Pain:  Vitals:   04/06/21 0916  TempSrc:   PainSc: 0-No pain                 Martha Clan

## 2021-04-06 NOTE — Anesthesia Procedure Notes (Signed)
Procedure Name: General with mask airway Date/Time: 04/06/2021 8:31 AM Performed by: Kelton Pillar, CRNA Pre-anesthesia Checklist: Patient identified, Emergency Drugs available, Suction available and Patient being monitored Patient Re-evaluated:Patient Re-evaluated prior to induction Oxygen Delivery Method: Simple face mask Induction Type: IV induction Placement Confirmation: positive ETCO2 and CO2 detector Dental Injury: Teeth and Oropharynx as per pre-operative assessment

## 2021-04-06 NOTE — Op Note (Signed)
[facility] Gastroenterology Patient Name: Jeremy Valencia Procedure Date: 04/06/2021 8:19 AM MRN: [id number] Account #: [id number] Date of Birth: [birth year] Admit Type: Outpatient Age: 80 Room: [facility] ENDO ROOM 1 Gender: Male Note Status: Finalized Procedure:             Colonoscopy Indications:           Rectal bleeding Providers:             Andrey Farmer MD, MD Referring MD:          Ramonita Lab, MD (Referring MD) Medicines:             Monitored Anesthesia Care Complications:         No immediate complications. Estimated blood loss:                         Minimal. Procedure:             Pre-Anesthesia Assessment:                        - Prior to the procedure, a History and Physical was                         performed, and patient medications and allergies were                         reviewed. The patient is competent. The risks and                         benefits of the procedure and the sedation options and                         risks were discussed with the patient. All questions                         were answered and informed consent was obtained.                         Patient identification and proposed procedure were                         verified by the physician, the nurse, the anesthetist                         and the technician in the endoscopy suite. Mental                         Status Examination: alert and oriented. Airway                         Examination: normal oropharyngeal airway and neck                         mobility. Respiratory Examination: clear to                         auscultation. CV Examination: normal. Prophylactic                         Antibiotics: The patient  does not require prophylactic                         antibiotics. Prior Anticoagulants: The patient has                         taken no previous anticoagulant or antiplatelet                         agents. ASA Grade Assessment: II - A patient with  mild                         systemic disease. After reviewing the risks and                         benefits, the patient was deemed in satisfactory                         condition to undergo the procedure. The anesthesia                         plan was to use monitored anesthesia care (MAC).                         Immediately prior to administration of medications,                         the patient was re-assessed for adequacy to receive                         sedatives. The heart rate, respiratory rate, oxygen                         saturations, blood pressure, adequacy of pulmonary                         ventilation, and response to care were monitored                         throughout the procedure. The physical status of the                         patient was re-assessed after the procedure.                        After obtaining informed consent, the colonoscope was                         passed under direct vision. Throughout the procedure,                         the patient's blood pressure, pulse, and oxygen                         saturations were monitored continuously. The                         Colonoscope was introduced through the anus and  advanced to the the cecum, identified by appendiceal                         orifice and ileocecal valve. The colonoscopy was                         performed without difficulty. The patient tolerated                         the procedure well. The quality of the bowel                         preparation was good except the rectum was poor. Findings:      The perianal and digital rectal examinations were normal.      A 3 mm polyp was found in the ascending colon. The polyp was sessile.       The polyp was removed with a cold snare. Resection and retrieval were       complete. Estimated blood loss was minimal.      A 3 mm polyp was found in the transverse colon. The polyp was sessile.       The polyp  was removed with a cold snare. Resection and retrieval were       complete. Estimated blood loss was minimal.      Many small and large-mouthed diverticula were found in the sigmoid       colon, descending colon, transverse colon, hepatic flexure and ascending       colon.      Internal hemorrhoids were found during retroflexion. The hemorrhoids       were Grade I (internal hemorrhoids that do not prolapse). Impression:            - One 3 mm polyp in the ascending colon, removed with                         a cold snare. Resected and retrieved.                        - One 3 mm polyp in the transverse colon, removed with                         a cold snare. Resected and retrieved.                        - Diverticulosis in the sigmoid colon, in the                         descending colon, in the transverse colon, at the                         hepatic flexure and in the ascending colon.                        - Internal hemorrhoids. Recommendation:        - Repeat colonoscopy is not recommended due to current                         age (64 years or older) for surveillance. Rectal  bleeding likely due to hemorrhoids but given poor prep                         in rectum if symptoms do not respond to conservative                         therapy, can consider flex sig.                        - Return to referring physician as previously                         scheduled.                        - Await pathology results.                        - Discharge patient to home.                        - Resume previous diet. Procedure Code(s):     --- Professional ---                        [phone], Colonoscopy, flexible; with removal of                         tumor(s), polyp(s), or other lesion(s) by snare                         technique Diagnosis Code(s):     --- Professional ---                        K63.5, Polyp of colon                        K64.0, First degree  hemorrhoids                        K62.5, Hemorrhage of anus and rectum                        K57.30, Diverticulosis of large intestine without                         perforation or abscess without bleeding CPT copyright 2019 [employer]. All rights reserved. The codes documented in this report are preliminary and upon coder review may  be revised to meet current compliance requirements. Andrey Farmer MD, MD 04/06/2021 8:57:09 AM Number of Addenda: 0 Note Initiated On: 04/06/2021 8:19 AM Scope Withdrawal Time: 0 hours 15 minutes 34 seconds  Total Procedure Duration: 0 hours 25 minutes 21 seconds  Estimated Blood Loss:  Estimated blood loss was minimal.      [facility]

## 2021-04-07 ENCOUNTER — Encounter: Payer: Self-pay | Admitting: Gastroenterology

## 2021-04-07 LAB — SURGICAL PATHOLOGY

## 2021-04-12 ENCOUNTER — Other Ambulatory Visit: Payer: Self-pay | Admitting: Podiatry

## 2021-04-19 ENCOUNTER — Ambulatory Visit: Payer: PRIVATE HEALTH INSURANCE | Admitting: Podiatry

## 2021-04-30 DIAGNOSIS — H401121 Primary open-angle glaucoma, left eye, mild stage: Secondary | ICD-10-CM | POA: Diagnosis not present

## 2021-05-05 ENCOUNTER — Other Ambulatory Visit: Payer: Self-pay

## 2021-05-05 ENCOUNTER — Encounter: Payer: Self-pay | Admitting: Podiatry

## 2021-05-05 ENCOUNTER — Ambulatory Visit (INDEPENDENT_AMBULATORY_CARE_PROVIDER_SITE_OTHER): Payer: Medicare HMO | Admitting: Podiatry

## 2021-05-05 DIAGNOSIS — D2371 Other benign neoplasm of skin of right lower limb, including hip: Secondary | ICD-10-CM

## 2021-05-05 DIAGNOSIS — M7751 Other enthesopathy of right foot: Secondary | ICD-10-CM | POA: Diagnosis not present

## 2021-05-05 MED ORDER — TRIAMCINOLONE ACETONIDE 40 MG/ML IJ SUSP
20.0000 mg | Freq: Once | INTRAMUSCULAR | Status: AC
  Administered 2021-05-05: 20 mg

## 2021-05-05 NOTE — Progress Notes (Signed)
He presents today chief complaint of pain around the ankle.  He is also complaining of painful elongated toenails on the right foot and a callus subfirst metatarsal phalangeal joint right foot.  Objective: Ulcers remain palpable.  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  He also has reactive hyper keratoma plantar aspect first metatarsal right foot.  And elongated toenails.  Assessment: Pain in limb secondary to onychomycosis and benign skin lesion also painful sinus tarsitis subtalar joint capsulitis.  Plan: I injected the sinus tarsi today 20 mg Kenalog, marcaine point maximal tenderness.  Also debrided nails 1 through 5 right and debrided painful reactive benign skin lesion

## 2021-07-18 ENCOUNTER — Other Ambulatory Visit: Payer: Self-pay | Admitting: Podiatry

## 2021-07-28 DIAGNOSIS — E785 Hyperlipidemia, unspecified: Secondary | ICD-10-CM | POA: Diagnosis not present

## 2021-07-28 DIAGNOSIS — J438 Other emphysema: Secondary | ICD-10-CM | POA: Diagnosis not present

## 2021-07-28 DIAGNOSIS — D692 Other nonthrombocytopenic purpura: Secondary | ICD-10-CM | POA: Diagnosis not present

## 2021-07-28 DIAGNOSIS — E118 Type 2 diabetes mellitus with unspecified complications: Secondary | ICD-10-CM | POA: Diagnosis not present

## 2021-07-28 DIAGNOSIS — I10 Essential (primary) hypertension: Secondary | ICD-10-CM | POA: Diagnosis not present

## 2021-08-04 DIAGNOSIS — Z0000 Encounter for general adult medical examination without abnormal findings: Secondary | ICD-10-CM | POA: Diagnosis not present

## 2021-08-04 DIAGNOSIS — E1142 Type 2 diabetes mellitus with diabetic polyneuropathy: Secondary | ICD-10-CM | POA: Diagnosis not present

## 2021-08-04 DIAGNOSIS — D692 Other nonthrombocytopenic purpura: Secondary | ICD-10-CM | POA: Diagnosis not present

## 2021-08-04 DIAGNOSIS — Z1389 Encounter for screening for other disorder: Secondary | ICD-10-CM | POA: Diagnosis not present

## 2021-08-04 DIAGNOSIS — I10 Essential (primary) hypertension: Secondary | ICD-10-CM | POA: Diagnosis not present

## 2021-08-04 DIAGNOSIS — K219 Gastro-esophageal reflux disease without esophagitis: Secondary | ICD-10-CM | POA: Diagnosis not present

## 2021-08-04 DIAGNOSIS — E1129 Type 2 diabetes mellitus with other diabetic kidney complication: Secondary | ICD-10-CM | POA: Diagnosis not present

## 2021-08-04 DIAGNOSIS — G250 Essential tremor: Secondary | ICD-10-CM | POA: Diagnosis not present

## 2021-08-04 DIAGNOSIS — J439 Emphysema, unspecified: Secondary | ICD-10-CM | POA: Diagnosis not present

## 2021-08-04 DIAGNOSIS — M545 Low back pain, unspecified: Secondary | ICD-10-CM | POA: Diagnosis not present

## 2021-10-01 DIAGNOSIS — H401121 Primary open-angle glaucoma, left eye, mild stage: Secondary | ICD-10-CM | POA: Diagnosis not present

## 2021-10-14 DIAGNOSIS — Z85820 Personal history of malignant melanoma of skin: Secondary | ICD-10-CM | POA: Diagnosis not present

## 2021-10-14 DIAGNOSIS — D485 Neoplasm of uncertain behavior of skin: Secondary | ICD-10-CM | POA: Diagnosis not present

## 2021-10-14 DIAGNOSIS — D2261 Melanocytic nevi of right upper limb, including shoulder: Secondary | ICD-10-CM | POA: Diagnosis not present

## 2021-10-14 DIAGNOSIS — L570 Actinic keratosis: Secondary | ICD-10-CM | POA: Diagnosis not present

## 2021-10-14 DIAGNOSIS — Z85828 Personal history of other malignant neoplasm of skin: Secondary | ICD-10-CM | POA: Diagnosis not present

## 2021-10-14 DIAGNOSIS — D2262 Melanocytic nevi of left upper limb, including shoulder: Secondary | ICD-10-CM | POA: Diagnosis not present

## 2021-10-14 DIAGNOSIS — D0462 Carcinoma in situ of skin of left upper limb, including shoulder: Secondary | ICD-10-CM | POA: Diagnosis not present

## 2021-10-14 DIAGNOSIS — Z86006 Personal history of melanoma in-situ: Secondary | ICD-10-CM | POA: Diagnosis not present

## 2021-10-14 DIAGNOSIS — D225 Melanocytic nevi of trunk: Secondary | ICD-10-CM | POA: Diagnosis not present

## 2021-10-20 ENCOUNTER — Other Ambulatory Visit: Payer: Self-pay | Admitting: Podiatry

## 2021-10-29 DIAGNOSIS — H26492 Other secondary cataract, left eye: Secondary | ICD-10-CM | POA: Diagnosis not present

## 2021-10-29 DIAGNOSIS — H401121 Primary open-angle glaucoma, left eye, mild stage: Secondary | ICD-10-CM | POA: Diagnosis not present

## 2021-11-01 ENCOUNTER — Ambulatory Visit: Payer: Medicare HMO | Admitting: Podiatry

## 2021-11-01 ENCOUNTER — Other Ambulatory Visit: Payer: Self-pay

## 2021-11-01 DIAGNOSIS — M7751 Other enthesopathy of right foot: Secondary | ICD-10-CM

## 2021-11-01 DIAGNOSIS — B351 Tinea unguium: Secondary | ICD-10-CM

## 2021-11-01 DIAGNOSIS — M79676 Pain in unspecified toe(s): Secondary | ICD-10-CM | POA: Diagnosis not present

## 2021-11-01 DIAGNOSIS — D2371 Other benign neoplasm of skin of right lower limb, including hip: Secondary | ICD-10-CM | POA: Diagnosis not present

## 2021-11-01 MED ORDER — TRIAMCINOLONE ACETONIDE 40 MG/ML IJ SUSP
20.0000 mg | Freq: Once | INTRAMUSCULAR | Status: AC
  Administered 2021-11-01: 20 mg

## 2021-11-01 NOTE — Progress Notes (Signed)
Presents today chief complaint of capsulitis pain and sinus tarsitis as he points to the area right foot today states the left foot is doing well he is also complaining of painful elongated toenails.  He is also has a callus on the plantar aspect of the right forefoot that he is concerned about.  Objective: I reviewed his past medical history medications allergies surgeries social history pulses are palpable.  Neurologic sensorium is intact Deetjen reflexes are intact muscle strength is normal symmetrical.  Has pain on end range of motion of the subtalar joint and on palpation of the sinus tarsi.  He has a small reactive hyperkeratotic benign skin lesion to the plantar aspect of the tibial sesamoid area of the first metatarsophalangeal joint of the right foot does not demonstrate an open lesion or wound at this time.  Otherwise his toenails are long thick yellow dystrophic and clinically mycotic.  Assessment: Pain in limb secondary to sinus tarsitis subtalar joint capsulitis.  Painful benign skin lesion some first metatarsal head right foot.  Also painful elongated toenails.  Plan: After sterile Betadine skin prep injected the sinus tarsi today 20 mg Kenalog 5 mg Marcaine point maximal tenderness.  Tolerated procedure well.  Also provided him with a nail trim and debridement of benign skin lesion.

## 2021-11-09 DIAGNOSIS — D0462 Carcinoma in situ of skin of left upper limb, including shoulder: Secondary | ICD-10-CM | POA: Diagnosis not present

## 2021-11-15 ENCOUNTER — Emergency Department: Payer: Medicare HMO

## 2021-11-15 ENCOUNTER — Encounter: Payer: Self-pay | Admitting: Emergency Medicine

## 2021-11-15 ENCOUNTER — Inpatient Hospital Stay
Admission: EM | Admit: 2021-11-15 | Discharge: 2021-11-18 | DRG: 864 | Disposition: A | Discharge status: Home Health | Payer: Medicare HMO | Attending: Hospitalist | Admitting: Hospitalist

## 2021-11-15 ENCOUNTER — Other Ambulatory Visit: Payer: Self-pay

## 2021-11-15 DIAGNOSIS — J449 Chronic obstructive pulmonary disease, unspecified: Secondary | ICD-10-CM

## 2021-11-15 DIAGNOSIS — R0682 Tachypnea, not elsewhere classified: Secondary | ICD-10-CM | POA: Diagnosis present

## 2021-11-15 DIAGNOSIS — A419 Sepsis, unspecified organism: Secondary | ICD-10-CM | POA: Diagnosis not present

## 2021-11-15 DIAGNOSIS — R9431 Abnormal electrocardiogram [ECG] [EKG]: Secondary | ICD-10-CM | POA: Diagnosis not present

## 2021-11-15 DIAGNOSIS — Y92009 Unspecified place in unspecified non-institutional (private) residence as the place of occurrence of the external cause: Secondary | ICD-10-CM | POA: Diagnosis not present

## 2021-11-15 DIAGNOSIS — Z8249 Family history of ischemic heart disease and other diseases of the circulatory system: Secondary | ICD-10-CM

## 2021-11-15 DIAGNOSIS — Z043 Encounter for examination and observation following other accident: Secondary | ICD-10-CM | POA: Diagnosis not present

## 2021-11-15 DIAGNOSIS — Z6827 Body mass index (BMI) 27.0-27.9, adult: Secondary | ICD-10-CM | POA: Diagnosis not present

## 2021-11-15 DIAGNOSIS — E119 Type 2 diabetes mellitus without complications: Secondary | ICD-10-CM | POA: Diagnosis not present

## 2021-11-15 DIAGNOSIS — K219 Gastro-esophageal reflux disease without esophagitis: Secondary | ICD-10-CM | POA: Diagnosis present

## 2021-11-15 DIAGNOSIS — Z888 Allergy status to other drugs, medicaments and biological substances status: Secondary | ICD-10-CM

## 2021-11-15 DIAGNOSIS — Z9842 Cataract extraction status, left eye: Secondary | ICD-10-CM

## 2021-11-15 DIAGNOSIS — E860 Dehydration: Secondary | ICD-10-CM | POA: Diagnosis not present

## 2021-11-15 DIAGNOSIS — Z79899 Other long term (current) drug therapy: Secondary | ICD-10-CM

## 2021-11-15 DIAGNOSIS — M47816 Spondylosis without myelopathy or radiculopathy, lumbar region: Secondary | ICD-10-CM | POA: Diagnosis not present

## 2021-11-15 DIAGNOSIS — Z87891 Personal history of nicotine dependence: Secondary | ICD-10-CM

## 2021-11-15 DIAGNOSIS — I10 Essential (primary) hypertension: Secondary | ICD-10-CM | POA: Diagnosis not present

## 2021-11-15 DIAGNOSIS — Z981 Arthrodesis status: Secondary | ICD-10-CM | POA: Diagnosis not present

## 2021-11-15 DIAGNOSIS — Z20822 Contact with and (suspected) exposure to covid-19: Secondary | ICD-10-CM | POA: Diagnosis not present

## 2021-11-15 DIAGNOSIS — E663 Overweight: Secondary | ICD-10-CM | POA: Diagnosis present

## 2021-11-15 DIAGNOSIS — G9340 Encephalopathy, unspecified: Secondary | ICD-10-CM | POA: Diagnosis not present

## 2021-11-15 DIAGNOSIS — I447 Left bundle-branch block, unspecified: Secondary | ICD-10-CM | POA: Diagnosis present

## 2021-11-15 DIAGNOSIS — S199XXA Unspecified injury of neck, initial encounter: Secondary | ICD-10-CM | POA: Diagnosis not present

## 2021-11-15 DIAGNOSIS — G9341 Metabolic encephalopathy: Secondary | ICD-10-CM | POA: Diagnosis present

## 2021-11-15 DIAGNOSIS — I700 Atherosclerosis of aorta: Secondary | ICD-10-CM | POA: Diagnosis not present

## 2021-11-15 DIAGNOSIS — Z820 Family history of epilepsy and other diseases of the nervous system: Secondary | ICD-10-CM

## 2021-11-15 DIAGNOSIS — H409 Unspecified glaucoma: Secondary | ICD-10-CM | POA: Diagnosis present

## 2021-11-15 DIAGNOSIS — W19XXXA Unspecified fall, initial encounter: Secondary | ICD-10-CM | POA: Diagnosis present

## 2021-11-15 DIAGNOSIS — Z8042 Family history of malignant neoplasm of prostate: Secondary | ICD-10-CM | POA: Diagnosis not present

## 2021-11-15 DIAGNOSIS — J438 Other emphysema: Secondary | ICD-10-CM | POA: Diagnosis not present

## 2021-11-15 DIAGNOSIS — R6510 Systemic inflammatory response syndrome (SIRS) of non-infectious origin without acute organ dysfunction: Secondary | ICD-10-CM | POA: Diagnosis not present

## 2021-11-15 DIAGNOSIS — K5900 Constipation, unspecified: Secondary | ICD-10-CM | POA: Diagnosis present

## 2021-11-15 DIAGNOSIS — R531 Weakness: Secondary | ICD-10-CM | POA: Diagnosis not present

## 2021-11-15 DIAGNOSIS — Z85828 Personal history of other malignant neoplasm of skin: Secondary | ICD-10-CM

## 2021-11-15 DIAGNOSIS — Z7982 Long term (current) use of aspirin: Secondary | ICD-10-CM

## 2021-11-15 DIAGNOSIS — E785 Hyperlipidemia, unspecified: Secondary | ICD-10-CM | POA: Diagnosis present

## 2021-11-15 DIAGNOSIS — R509 Fever, unspecified: Secondary | ICD-10-CM | POA: Diagnosis not present

## 2021-11-15 DIAGNOSIS — S0990XA Unspecified injury of head, initial encounter: Secondary | ICD-10-CM | POA: Diagnosis not present

## 2021-11-15 LAB — URINALYSIS, ROUTINE W REFLEX MICROSCOPIC
Bilirubin Urine: NEGATIVE
Glucose, UA: >=500 mg/dL — AB
Ketones, ur: 5 mg/dL — AB
Leukocytes,Ua: NEGATIVE
Nitrite: NEGATIVE
Protein, ur: NEGATIVE mg/dL
Specific Gravity, Urine: 1.026 (ref 1.005–1.030)
Squamous Epithelial / HPF: NONE SEEN (ref 0–5)
pH: 5 (ref 5.0–8.0)

## 2021-11-15 LAB — CK: Total CK: 399 U/L — ABNORMAL HIGH (ref 49–397)

## 2021-11-15 LAB — CBC WITH DIFFERENTIAL/PLATELET
Abs Immature Granulocytes: 0.1 10*3/uL — ABNORMAL HIGH (ref 0.00–0.07)
Basophils Absolute: 0.1 10*3/uL (ref 0.0–0.1)
Basophils Relative: 0 %
Eosinophils Absolute: 0 10*3/uL (ref 0.0–0.5)
Eosinophils Relative: 0 %
HCT: 48.2 % (ref 39.0–52.0)
Hemoglobin: 16 g/dL (ref 13.0–17.0)
Immature Granulocytes: 1 %
Lymphocytes Relative: 9 %
Lymphs Abs: 1.7 10*3/uL (ref 0.7–4.0)
MCH: 31.9 pg (ref 26.0–34.0)
MCHC: 33.2 g/dL (ref 30.0–36.0)
MCV: 96.2 fL (ref 80.0–100.0)
Monocytes Absolute: 2 10*3/uL — ABNORMAL HIGH (ref 0.1–1.0)
Monocytes Relative: 10 %
Neutro Abs: 16 10*3/uL — ABNORMAL HIGH (ref 1.7–7.7)
Neutrophils Relative %: 80 %
Platelets: 295 10*3/uL (ref 150–400)
RBC: 5.01 MIL/uL (ref 4.22–5.81)
RDW: 11.8 % (ref 11.5–15.5)
WBC: 19.8 10*3/uL — ABNORMAL HIGH (ref 4.0–10.5)
nRBC: 0 % (ref 0.0–0.2)

## 2021-11-15 LAB — COMPREHENSIVE METABOLIC PANEL
ALT: 19 U/L (ref 0–44)
AST: 24 U/L (ref 15–41)
Albumin: 3.8 g/dL (ref 3.5–5.0)
Alkaline Phosphatase: 51 U/L (ref 38–126)
Anion gap: 11 (ref 5–15)
BUN: 22 mg/dL (ref 8–23)
CO2: 27 mmol/L (ref 22–32)
Calcium: 9.1 mg/dL (ref 8.9–10.3)
Chloride: 96 mmol/L — ABNORMAL LOW (ref 98–111)
Creatinine, Ser: 1.01 mg/dL (ref 0.61–1.24)
GFR, Estimated: >60 mL/min (ref >60)
Glucose, Bld: 82 mg/dL (ref 70–99)
Potassium: 3.5 mmol/L (ref 3.5–5.1)
Sodium: 134 mmol/L — ABNORMAL LOW (ref 135–145)
Total Bilirubin: 0.7 mg/dL (ref 0.3–1.2)
Total Protein: 7.7 g/dL (ref 6.5–8.1)

## 2021-11-15 LAB — TROPONIN I (HIGH SENSITIVITY)
Troponin I (High Sensitivity): 11 ng/L (ref <18)
Troponin I (High Sensitivity): 11 ng/L (ref <18)

## 2021-11-15 LAB — RESP PANEL BY RT-PCR (FLU A&B, COVID) ARPGX2
Influenza A by PCR: NEGATIVE
Influenza B by PCR: NEGATIVE
SARS Coronavirus 2 by RT PCR: NEGATIVE

## 2021-11-15 LAB — LACTIC ACID, PLASMA: Lactic Acid, Venous: 1.4 mmol/L (ref 0.5–1.9)

## 2021-11-15 MED ORDER — ASPIRIN EC 81 MG PO TBEC
81.0000 mg | DELAYED_RELEASE_TABLET | Freq: Every day | ORAL | Status: DC
  Administered 2021-11-16 – 2021-11-18 (×3): 81 mg via ORAL
  Filled 2021-11-15 (×3): qty 1

## 2021-11-15 MED ORDER — VANCOMYCIN HCL 2000 MG/400ML IV SOLN: 2000.0000 mg | INTRAVENOUS | Status: DC

## 2021-11-15 MED ORDER — ALBUTEROL SULFATE (2.5 MG/3ML) 0.083% IN NEBU: 2.5000 mg | INHALATION_SOLUTION | Freq: Four times a day (QID) | RESPIRATORY_TRACT | Status: DC | PRN

## 2021-11-15 MED ORDER — HYDRALAZINE HCL 25 MG PO TABS
25.0000 mg | ORAL_TABLET | Freq: Two times a day (BID) | ORAL | Status: DC
  Administered 2021-11-16 – 2021-11-18 (×6): 25 mg via ORAL
  Filled 2021-11-15 (×6): qty 1

## 2021-11-15 MED ORDER — TIMOLOL MALEATE 0.5 % OP SOLN
1.0000 [drp] | Freq: Every day | OPHTHALMIC | Status: DC
  Administered 2021-11-16 – 2021-11-17 (×2): 1 [drp] via OPHTHALMIC
  Filled 2021-11-15: qty 5

## 2021-11-15 MED ORDER — SODIUM CHLORIDE 0.9 % IV BOLUS
500.0000 mL | Freq: Once | INTRAVENOUS | Status: AC
  Administered 2021-11-15: 500 mL via INTRAVENOUS

## 2021-11-15 MED ORDER — SENNOSIDES-DOCUSATE SODIUM 8.6-50 MG PO TABS: 1.0000 | ORAL_TABLET | Freq: Every evening | ORAL | Status: DC | PRN

## 2021-11-15 MED ORDER — ACETAMINOPHEN 650 MG RE SUPP
650.0000 mg | Freq: Four times a day (QID) | RECTAL | Status: DC | PRN
Start: 2021-11-15 — End: 2021-11-18

## 2021-11-15 MED ORDER — SODIUM CHLORIDE 0.9 % IV SOLN: INTRAVENOUS | Status: AC

## 2021-11-15 MED ORDER — CEFTRIAXONE SODIUM 2 G IJ SOLR
2.0000 g | Freq: Once | INTRAMUSCULAR | Status: AC
  Administered 2021-11-15: 2 g via INTRAVENOUS
  Filled 2021-11-15: qty 20

## 2021-11-15 MED ORDER — ACETAMINOPHEN 325 MG PO TABS
650.0000 mg | ORAL_TABLET | Freq: Four times a day (QID) | ORAL | Status: DC | PRN
  Administered 2021-11-16 (×2): 650 mg via ORAL
  Filled 2021-11-15 (×2): qty 2

## 2021-11-15 MED ORDER — ATORVASTATIN CALCIUM 20 MG PO TABS
20.0000 mg | ORAL_TABLET | Freq: Every day | ORAL | Status: DC
  Administered 2021-11-16 – 2021-11-18 (×3): 20 mg via ORAL
  Filled 2021-11-15 (×3): qty 1

## 2021-11-15 MED ORDER — VANCOMYCIN HCL 2000 MG/400ML IV SOLN
2000.0000 mg | Freq: Once | INTRAVENOUS | Status: AC
  Administered 2021-11-16: 2000 mg via INTRAVENOUS
  Filled 2021-11-15: qty 400

## 2021-11-15 MED ORDER — SODIUM CHLORIDE 0.9 % IV SOLN
2.0000 g | Freq: Three times a day (TID) | INTRAVENOUS | Status: DC
  Administered 2021-11-16 (×2): 2 g via INTRAVENOUS
  Filled 2021-11-15 (×5): qty 2

## 2021-11-15 MED ORDER — ENOXAPARIN SODIUM 40 MG/0.4ML IJ SOSY
40.0000 mg | PREFILLED_SYRINGE | INTRAMUSCULAR | Status: DC
  Administered 2021-11-16 – 2021-11-17 (×3): 40 mg via SUBCUTANEOUS
  Filled 2021-11-15 (×3): qty 0.4

## 2021-11-15 MED ORDER — ONDANSETRON HCL 4 MG/2ML IJ SOLN
4.0000 mg | Freq: Four times a day (QID) | INTRAMUSCULAR | Status: DC | PRN
  Administered 2021-11-15: 4 mg via INTRAVENOUS
  Filled 2021-11-15: qty 2

## 2021-11-15 MED ORDER — METRONIDAZOLE 500 MG/100ML IV SOLN
500.0000 mg | Freq: Two times a day (BID) | INTRAVENOUS | Status: DC
  Administered 2021-11-16 (×2): 500 mg via INTRAVENOUS
  Filled 2021-11-15: qty 100

## 2021-11-15 MED ORDER — INSULIN ASPART 100 UNIT/ML IJ SOLN
0.0000 [IU] | Freq: Three times a day (TID) | INTRAMUSCULAR | Status: DC
  Administered 2021-11-16: 2 [IU] via SUBCUTANEOUS
  Administered 2021-11-17: 1 [IU] via SUBCUTANEOUS
  Administered 2021-11-17: 2 [IU] via SUBCUTANEOUS
  Filled 2021-11-15 (×3): qty 1

## 2021-11-15 MED ORDER — LISINOPRIL 20 MG PO TABS
20.0000 mg | ORAL_TABLET | Freq: Every day | ORAL | Status: DC
  Administered 2021-11-16 – 2021-11-17 (×2): 20 mg via ORAL
  Filled 2021-11-15 (×2): qty 1

## 2021-11-15 MED ORDER — ONDANSETRON HCL 4 MG PO TABS: 4.0000 mg | ORAL_TABLET | Freq: Four times a day (QID) | ORAL | Status: DC | PRN

## 2021-11-15 NOTE — Progress Notes (Signed)
Pharmacy Antibiotic Note ? ?Jeremy Valencia is a 81 y.o. male admitted on 11/15/2021 with  infection of unknown source .  Pharmacy has been consulted for Vanc, Cefepime dosing. ? ?Plan: ?Cefepime 2 gm IV Q8H ordered to start on 3/14 @ 0100. ? ?Vancomycin 2 gm IV X 1 ordered for 3/14 @ ~ 0100. ?Vancomycin 2 gm IV Q24H ordered to start on 3/15 @ 0100. ? ?AUC = 508.7 ?Vanc trough = 11.1 mcg/mL  ? ?Height: '6\' 1"'$  (185.4 cm) ?Weight: 95.3 kg (210 lb) ?IBW/kg (Calculated) : 79.9 ? ?Temp (24hrs), Avg:99.7 ?F (37.6 ?C), Min:98.9 ?F (37.2 ?C), Max:100.7 ?F (38.2 ?C) ? ?Recent Labs  ?Lab 11/15/21 ?1925 11/15/21 ?2109  ?WBC 19.8*  --   ?CREATININE 1.01  --   ?LATICACIDVEN  --  1.4  ?  ?Estimated Creatinine Clearance: 63.7 mL/min (by C-G formula based on SCr of 1.01 mg/dL).   ? ?Allergies  ?Allergen Reactions  ? Metformin Diarrhea  ? Rofecoxib Other (See Comments)  ?  Other reaction(s): Unknown  ? Lamisil [Terbinafine] Rash  ? ? ?Antimicrobials this admission: ?  >>  ?  >>  ? ?Dose adjustments this admission: ? ? ?Microbiology results: ? BCx:  ? UCx:   ? Sputum:   ? MRSA PCR:  ? ?Thank you for allowing pharmacy to be a part of this patient?s care. ? ?Jeremy Valencia D ?11/15/2021 11:54 PM ? ?

## 2021-11-15 NOTE — ED Triage Notes (Signed)
Pt via POV from home. Pt c/o weakness and possible UTI. Per son, pt had a fever this afternoon. Pt is A&Ox4 and NAD.  ?

## 2021-11-15 NOTE — H&P (Signed)
History and Physical    BARBARA KENG ZOX:[id number] DOB: [birth year] DOA: 11/15/2021  PCP: Adin Hector, MD   Patient coming from: Home   Chief Complaint: Fever, fatigue   HPI: Jeremy Valencia is a pleasant 81 y.o. male with medical history significant for hypertension, type 2 diabetes mellitus, and emphysema, presenting to the emergency department with fever, fatigue, and waxing and waning confusion.  Patient is accompanied by his daughter who assists with the history.  Patient is normally independent and physically active, but was reported to become generally weak and lethargic yesterday evening and slid to the ground and was unable to get up.  EMS went to his house last night and helped him up into a chair.  He was doing better earlier today and was able to go to the bank and do some other errands before becoming lethargic again.  He reportedly had fevers at home.  He has not had any specific complaints, has not appeared to be short of breath or coughing, and has not been vomiting or having diarrhea.  ED Course: Upon arrival to the ED, patient is found to be afebrile, saturating well on room air, and with stable blood pressure.  EKG features sinus rhythm with incomplete LBBB.  Chest x-ray negative for acute cardiopulmonary disease.  No acute findings noted on CT head or CT cervical spine.  Blood work notable for WBC 19,800 and slightly elevated serum CK.  Blood cultures were collected in the ED and the patient was given IV fluids and Rocephin.  He later developed a fever in the ED.  Review of Systems:  Unable to complete ROS secondary to the patient's clinical condition.  Past Medical History:  Diagnosis Date   Arthritis    BPH (benign prostatic hyperplasia)    Bursitis 05/16/2013   BURSITIS/CAPSULITIS RT ANKLE   Cancer ([place])    skin cancer   Capsulitis of right foot 01/21/2013   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    Diabetes mellitus without complication ([place])     Diverticulosis    Emphysema, unspecified ([place])    GERD (gastroesophageal reflux disease)    Hyperlipidemia    Hypertension    Plantar fasciitis     Past Surgical History:  Procedure Laterality Date   APPENDECTOMY     BACK SURGERY     CATARACT EXTRACTION Left    COLONOSCOPY     COLONOSCOPY WITH PROPOFOL N/A 04/06/2021   Procedure: COLONOSCOPY WITH PROPOFOL;  Surgeon: Lesly Rubenstein, MD;  Location: [facility] ENDOSCOPY;  Service: Endoscopy;  Laterality: N/A;  DM   FOOT SURGERY     LAMINECTOMY     right eye     ROTATOR CUFF REPAIR Right    UPPER GI ENDOSCOPY      Social History:   reports that he has quit smoking. He has never used smokeless tobacco. He reports current alcohol use. He reports that he does not use drugs.  Allergies  Allergen Reactions   Metformin Diarrhea   Rofecoxib Other (See Comments)    Other reaction(s): Unknown   Lamisil [Terbinafine] Rash    Family History  Problem Relation Age of Onset   Cerebral aneurysm Mother    CAD Father    Parkinson's disease Father    Prostate cancer Father      Prior to Admission medications   Medication Sig Start Date End Date Taking? Authorizing Provider  albuterol (PROVENTIL HFA;VENTOLIN HFA) 108 (90 Base) MCG/ACT inhaler Inhale 2 puffs into the  lungs every 6 (six) hours as needed for wheezing or shortness of breath. 10/28/17  Yes Gladstone Lighter, MD  Ascorbic Acid (VITAMIN C) 1000 MG tablet Take 1,000 mg by mouth daily.   Yes [provider]  aspirin 81 MG tablet Take 81 mg by mouth daily.   Yes [provider]  atorvastatin (LIPITOR) 20 MG tablet Take 20 mg by mouth daily.   Yes [provider]  Cholecalciferol (VITAMIN D3) 1000 units CAPS Take 1,000 Units by mouth daily.    Yes [provider]  co-enzyme Q-10 50 MG capsule Take 50 mg by mouth as directed.   Yes [provider]  colchicine 0.6 MG tablet Take 0.6 mg by mouth as needed.    Yes [provider]   gabapentin (NEURONTIN) 300 MG capsule TAKE 1 CAPSULE BY MOUTH THREE TIMES DAILY 10/20/21  Yes Hyatt, Max T, DPM  glimepiride (AMARYL) 4 MG tablet Take 4 mg by mouth every morning. 11/03/21  Yes [provider]  hydrALAZINE (APRESOLINE) 25 MG tablet Take 25 mg by mouth 2 (two) times daily. 10/10/21  Yes [provider]  hydrochlorothiazide (HYDRODIURIL) 25 MG tablet Take 25 mg by mouth 2 (two) times daily.   Yes [provider]  JARDIANCE 10 MG TABS tablet Take 10 mg by mouth daily. 10/25/21  Yes [provider]  lisinopril (ZESTRIL) 20 MG tablet Take 20 mg by mouth daily. 10/18/21  Yes [provider]  Multiple Vitamin (MULTI-VITAMINS) TABS Take 1 tablet by mouth daily.    Yes [provider]  Omega-3 Fatty Acids (FISH OIL) 1000 MG CAPS Take 3-4 capsules by mouth daily.    Yes [provider]  timolol (TIMOPTIC) 0.5 % ophthalmic solution Place 1 drop into both eyes daily. 10/20/21  Yes [provider]  Zinc Acetate 50 MG CAPS Take 1 capsule by mouth daily.    Yes [provider]    Physical Exam: Vitals:   11/15/21 2045 11/15/21 2100 11/15/21 2115 11/15/21 2325  BP:  (!) 145/73    Pulse: 96 92 95   Resp: (!) 22 (!) 22 (!) 24   Temp:    (!) 100.7 F (38.2 C)  TempSrc:    Oral  SpO2: 97%  96%   Weight:      Height:        Constitutional: NAD, calm  Eyes: PERTLA, lids and conjunctivae normal ENMT: Mucous membranes are moist. Posterior pharynx clear of any exudate or lesions.   Neck: supple, no masses  Respiratory:  no wheezing, no crackles. No accessory muscle use.  Cardiovascular: S1 & S2 heard, regular rate and rhythm. No extremity edema.   Abdomen: No distension, no tenderness, soft. Bowel sounds active.  Musculoskeletal: no clubbing / cyanosis. No joint deformity upper and lower extremities.   Skin: Crusted lesion on dorsum of left hand without drainage or significant surrounding erythema or induration.  Warm, dry, well-perfused. Neurologic: CN 2-12 grossly intact. Moving all extremities. Listless, only answering yes/no questions.   Psychiatric: Calm. Cooperative.    Labs and Imaging on Admission: I have personally reviewed following labs and imaging studies  CBC: Recent Labs  Lab 11/15/21 1925  WBC 19.8*  NEUTROABS 16.0*  HGB 16.0  HCT 48.2  MCV 96.2  PLT 916   Basic Metabolic Panel: Recent Labs  Lab 11/15/21 1925  NA 134*  K 3.5  CL 96*  CO2 27  GLUCOSE 82  BUN 22  CREATININE 1.01  CALCIUM 9.1  GFR: Estimated Creatinine Clearance: 63.7 mL/min (by C-G formula based on SCr of 1.01 mg/dL). Liver Function Tests: Recent Labs  Lab 11/15/21 1925  AST 24  ALT 19  ALKPHOS 51  BILITOT 0.7  PROT 7.7  ALBUMIN 3.8   No results for input(s): LIPASE, AMYLASE in the last 168 hours. No results for input(s): AMMONIA in the last 168 hours. Coagulation Profile: No results for input(s): INR, PROTIME in the last 168 hours. Cardiac Enzymes: Recent Labs  Lab 11/15/21 1925  CKTOTAL 399*   BNP (last 3 results) No results for input(s): PROBNP in the last 8760 hours. HbA1C: No results for input(s): HGBA1C in the last 72 hours. CBG: No results for input(s): GLUCAP in the last 168 hours. Lipid Profile: No results for input(s): CHOL, HDL, LDLCALC, TRIG, CHOLHDL, LDLDIRECT in the last 72 hours. Thyroid Function Tests: No results for input(s): TSH, T4TOTAL, FREET4, T3FREE, THYROIDAB in the last 72 hours. Anemia Panel: No results for input(s): VITAMINB12, FOLATE, FERRITIN, TIBC, IRON, RETICCTPCT in the last 72 hours. Urine analysis:    Component Value Date/Time   COLORURINE YELLOW (A) 11/15/2021 1809   APPEARANCEUR CLEAR (A) 11/15/2021 1809   APPEARANCEUR Clear 06/19/2015 1443   LABSPEC 1.026 11/15/2021 1809   PHURINE 5.0 11/15/2021 1809   GLUCOSEU >=500 (A) 11/15/2021 1809   HGBUR SMALL (A) 11/15/2021 1809   BILIRUBINUR NEGATIVE 11/15/2021 1809   BILIRUBINUR Negative  06/19/2015 1443   KETONESUR 5 (A) 11/15/2021 1809   PROTEINUR NEGATIVE 11/15/2021 1809   NITRITE NEGATIVE 11/15/2021 1809   LEUKOCYTESUR NEGATIVE 11/15/2021 1809   Sepsis Labs: '@LABRCNTIP' (procalcitonin:4,lacticidven:4) ) Recent Results (from the past 240 hour(s))  Resp Panel by RT-PCR (Flu A&B, Covid) Nasopharyngeal Swab     Status: None   Collection Time: 11/15/21  5:54 PM   Specimen: Nasopharyngeal Swab; Nasopharyngeal(NP) swabs in vial transport medium  Result Value Ref Range Status   SARS Coronavirus 2 by RT PCR NEGATIVE NEGATIVE Final    Comment: (NOTE) SARS-CoV-2 target nucleic acids are NOT DETECTED.  The SARS-CoV-2 RNA is generally detectable in upper respiratory specimens during the acute phase of infection. The lowest concentration of SARS-CoV-2 viral copies this assay can detect is 138 copies/mL. A negative result does not preclude SARS-Cov-2 infection and should not be used as the sole basis for treatment or other patient management decisions. A negative result may occur with  improper specimen collection/handling, submission of specimen other than nasopharyngeal swab, presence of viral mutation(s) within the areas targeted by this assay, and inadequate number of viral copies(<138 copies/mL). A negative result must be combined with clinical observations, patient history, and epidemiological information. The expected result is Negative.  Fact Sheet for Patients:  EntrepreneurPulse.com.au  Fact Sheet for Healthcare Providers:  IncredibleEmployment.be  This test is no t yet approved or cleared by the [place] FDA and  has been authorized for detection and/or diagnosis of SARS-CoV-2 by FDA under an Emergency Use Authorization (EUA). This EUA will remain  in effect (meaning this test can be used) for the duration of the COVID-19 declaration under Section 564(b)(1) of the Act, 21 U.S.C.section 360bbb-3(b)(1), unless the  authorization is terminated  or revoked sooner.       Influenza A by PCR NEGATIVE NEGATIVE Final   Influenza B by PCR NEGATIVE NEGATIVE Final    Comment: (NOTE) The Xpert Xpress SARS-CoV-2/FLU/RSV plus assay is intended as an aid in the diagnosis of influenza from Nasopharyngeal swab specimens and should not be used as a sole basis  for treatment. Nasal washings and aspirates are unacceptable for Xpert Xpress SARS-CoV-2/FLU/RSV testing.  Fact Sheet for Patients: EntrepreneurPulse.com.au  Fact Sheet for Healthcare Providers: IncredibleEmployment.be  This test is not yet approved or cleared by the [place] FDA and has been authorized for detection and/or diagnosis of SARS-CoV-2 by FDA under an Emergency Use Authorization (EUA). This EUA will remain in effect (meaning this test can be used) for the duration of the COVID-19 declaration under Section 564(b)(1) of the Act, 21 U.S.C. section 360bbb-3(b)(1), unless the authorization is terminated or revoked.  Performed at [facility], [address], [place], [place] [address]      Radiological Exams on Admission: DG Chest 2 View  Result Date: 11/15/2021 CLINICAL DATA:  Fall EXAM: CHEST - 2 VIEW COMPARISON:  10/26/2017 FINDINGS: Heart and mediastinal contours are within normal limits. No focal opacities or effusions. No acute bony abnormality. Aortic atherosclerosis. IMPRESSION: No active cardiopulmonary disease. Electronically Signed   By: Rolm Baptise M.D.   On: 11/15/2021 18:44   CT HEAD WO CONTRAST (5MM)  Result Date: 11/15/2021 CLINICAL DATA:  Head trauma, minor (Age >= 65y); Neck trauma (Age >= 65y). Per son, pt had a fever this afternoon. Pt is AANDOx4 and NAD. EXAM: CT HEAD WITHOUT CONTRAST CT CERVICAL SPINE WITHOUT CONTRAST TECHNIQUE: Multidetector CT imaging of the head and cervical spine was performed following the standard protocol without intravenous contrast. Multiplanar  CT image reconstructions of the cervical spine were also generated. RADIATION DOSE REDUCTION: This exam was performed according to the departmental dose-optimization program which includes automated exposure control, adjustment of the mA and/or kV according to patient size and/or use of iterative reconstruction technique. COMPARISON:  CT head 11/18/2014 FINDINGS: CT HEAD FINDINGS BRAIN: BRAIN Cerebral ventricle sizes are concordant with the degree of cerebral volume loss. Patchy and confluent areas of decreased attenuation are noted throughout the deep and periventricular white matter of the cerebral hemispheres bilaterally, compatible with chronic microvascular ischemic disease. No evidence of large-territorial acute infarction. No parenchymal hemorrhage. No mass lesion. No extra-axial collection. No mass effect or midline shift. No hydrocephalus. Basilar cisterns are patent. Vascular: No hyperdense vessel. Atherosclerotic calcifications are present within the cavernous internal carotid and vertebral arteries. Skull: No acute fracture or focal lesion. Sinuses/Orbits: Paranasal sinuses and mastoid air cells are clear. Right ocular prosthesis. The left is unremarkable. Other: None. CT CERVICAL SPINE FINDINGS Alignment: Grade 1 anterolisthesis of C4 on C5, C5 on C6, C6 on C7. Skull base and vertebrae: Multilevel mild degenerative changes of the spine. No acute fracture. No aggressive appearing focal osseous lesion or focal pathologic process. Soft tissues and spinal canal: No prevertebral fluid or swelling. No visible canal hematoma. Upper chest: Emphysematous change. Other: Atherosclerotic plaque. IMPRESSION: 1.  No acute intracranial abnormality. 2. No acute displaced fracture or traumatic listhesis of the cervical spine. 3.  Emphysema (ICD10-J43.9). Electronically Signed   By: Iven Finn M.D.   On: 11/15/2021 18:39   CT Cervical Spine Wo Contrast  Result Date: 11/15/2021 CLINICAL DATA:  Head trauma, minor  (Age >= 65y); Neck trauma (Age >= 65y). Per son, pt had a fever this afternoon. Pt is AANDOx4 and NAD. EXAM: CT HEAD WITHOUT CONTRAST CT CERVICAL SPINE WITHOUT CONTRAST TECHNIQUE: Multidetector CT imaging of the head and cervical spine was performed following the standard protocol without intravenous contrast. Multiplanar CT image reconstructions of the cervical spine were also generated. RADIATION DOSE REDUCTION: This exam was performed according to the departmental dose-optimization program which includes  automated exposure control, adjustment of the mA and/or kV according to patient size and/or use of iterative reconstruction technique. COMPARISON:  CT head 11/18/2014 FINDINGS: CT HEAD FINDINGS BRAIN: BRAIN Cerebral ventricle sizes are concordant with the degree of cerebral volume loss. Patchy and confluent areas of decreased attenuation are noted throughout the deep and periventricular white matter of the cerebral hemispheres bilaterally, compatible with chronic microvascular ischemic disease. No evidence of large-territorial acute infarction. No parenchymal hemorrhage. No mass lesion. No extra-axial collection. No mass effect or midline shift. No hydrocephalus. Basilar cisterns are patent. Vascular: No hyperdense vessel. Atherosclerotic calcifications are present within the cavernous internal carotid and vertebral arteries. Skull: No acute fracture or focal lesion. Sinuses/Orbits: Paranasal sinuses and mastoid air cells are clear. Right ocular prosthesis. The left is unremarkable. Other: None. CT CERVICAL SPINE FINDINGS Alignment: Grade 1 anterolisthesis of C4 on C5, C5 on C6, C6 on C7. Skull base and vertebrae: Multilevel mild degenerative changes of the spine. No acute fracture. No aggressive appearing focal osseous lesion or focal pathologic process. Soft tissues and spinal canal: No prevertebral fluid or swelling. No visible canal hematoma. Upper chest: Emphysematous change. Other: Atherosclerotic plaque.  IMPRESSION: 1.  No acute intracranial abnormality. 2. No acute displaced fracture or traumatic listhesis of the cervical spine. 3.  Emphysema (ICD10-J43.9). Electronically Signed   By: Iven Finn M.D.   On: 11/15/2021 18:39    EKG: Independently reviewed. Sinus rhythm, incomplete LBBB.   Assessment/Plan   1. SIRS  - Presents with fatigue, fever, and waxing & waning confusion  - He has fever and WBC 19,800 in ED; source not yet identified  - Blood cultures collected in ED and empiric antibiotics started   - Continue empiric antibiotics, check/trend procalcitonin and lactate, follow cultures and clinical course    2. Acute encephalopathy  - Presents with fatigue, fever, and waxing and waning confusion  - No acute findings on head CT, no focal neurologic deficit noted on admission   - Likely d/t fever/infection; expand workup if fails to resolve with treatment of fever/infection as above    3. Type II DM  - A1c was 7.7% in November 2022  - Check CBGs and use low-intensity SSI if needed for now   4. Hypertension  - Continue hydralazine and lisinopril as tolerated   5. COPD  - Stable, no respiratory s/s on admission  - Continue as-needed albuterol    DVT prophylaxis: Lovenox  Code Status: Full. Patient unable to discuss on admission; his daughter has documents at home, isn't sure what they say, but will check.  Level of Care: Level of care: Med-Surg Family Communication: Daughter at bedside  Disposition Plan:  Patient is from: home  Anticipated d/c is to: TBD Anticipated d/c date is: Possibly as early as 3/14 or 11/17/21  Patient currently: Pending improvement in mental status, cultures  Consults called: none  Admission status: Observation     Vianne Bulls, MD [facility]  11/15/2021, 11:26 PM

## 2021-11-15 NOTE — ED Notes (Signed)
RN and pt son assisted pt with standing to urinate. RN and son stood pt up and held pt. Pt is very unsteady and not able to support himself without a two person assist. RN to notify provider of findings. ?

## 2021-11-15 NOTE — ED Provider Notes (Signed)
? ?[facility] ?Provider Note ? ? ? Event Date/Time  ? First MD Initiated Contact with Patient 11/15/21 1752   ?  (approximate) ? ? ?History  ? ?Weakness ? ? ?HPI ? ?Jeremy Valencia is a 81 y.o. male who comes in from home for weakness and possible urinary infection.  According to son patient had a fall overnight where he fell onto the ground.  Unclear if he hit his head.  He had to call EMS to get him up off the ground and then was sitting in a chair the rest of the day.  He was unable to get up easily from the chair and family brought him here to be further evaluated.  This was an acute change from his baseline.  They do report that he might of had a fever but did not give any fever reducers.  He denies any chest pain, shortness of breath, leg pain or any other concerns at this time ? ? ?Physical Exam  ? ?Triage Vital Signs: ?ED Triage Vitals  ?Enc Vitals Group  ?   BP 11/15/21 1735 (!) 152/79  ?   Pulse Rate 11/15/21 1735 94  ?   Resp 11/15/21 1735 18  ?   Temp 11/15/21 1735 98.9 ?F (37.2 ?C)  ?   Temp Source 11/15/21 1735 Oral  ?   SpO2 11/15/21 1735 96 %  ?   Weight 11/15/21 1732 210 lb (95.3 kg)  ?   Height 11/15/21 1732 '6\' 1"'$  (1.854 m)  ?   Head Circumference --   ?   Peak Flow --   ?   Pain Score 11/15/21 1732 0  ?   Pain Loc --   ?   Pain Edu? --   ?   Excl. in [place]? --   ? ? ?Most recent vital signs: ?Vitals:  ? 11/15/21 1735  ?BP: (!) 152/79  ?Pulse: 94  ?Resp: 18  ?Temp: 98.9 ?F (37.2 ?C)  ?SpO2: 96%  ? ? ? ?General: Awake, no distress.  ?CV:  Good peripheral perfusion.  ?Resp:  Normal effort.  ?Abd:  No distention.  ?Other:  Patient is able to lift both legs off the bed without any tenderness.  He is able to have full range of motion of his arms without any tenderness.  His abdomen is soft and nontender.  No chest wall tenderness.  He is alert and oriented x4..  Patient has a little bit of redness noted near a prior biopsy site on his left wrist without any active drainage  noted. ? ? ?ED Results / Procedures / Treatments  ? ?Labs ?(all labs ordered are listed, but only abnormal results are displayed) ?Labs Reviewed  ?URINALYSIS, ROUTINE W REFLEX MICROSCOPIC - Abnormal; Notable for the following components:  ?    Result Value  ? Color, Urine YELLOW (*)   ? APPearance CLEAR (*)   ? Glucose, UA >=500 (*)   ? Hgb urine dipstick SMALL (*)   ? Ketones, ur 5 (*)   ? Bacteria, UA RARE (*)   ? All other components within normal limits  ?RESP PANEL BY RT-PCR (FLU A&B, COVID) ARPGX2  ?CBC WITH DIFFERENTIAL/PLATELET  ?COMPREHENSIVE METABOLIC PANEL  ?CK  ?TROPONIN I (HIGH SENSITIVITY)  ? ? ? ?EKG ? ?My interpretation of EKG: ? ?Normal sinus rate of 89 without any ST elevation or T wave inversions, normal intervals ? ?RADIOLOGY ?I have reviewed the CT head personally and no evidence of intracranial hemorrhage ? ?  PROCEDURES: ? ?Critical Care performed: Yes, see critical care procedure note(s) ? ?.1-3 Lead EKG Interpretation ?Performed by: Vanessa Ransom, MD ?Authorized by: Vanessa Rialto, MD  ? ?  Interpretation: normal   ?  ECG rate:  90 ?  ECG rate assessment: normal   ?  Rhythm: sinus rhythm   ?  Ectopy: none   ?  Conduction: normal   ?.Critical Care ?Performed by: Vanessa Montz, MD ?Authorized by: Vanessa [place], MD  ? ?Critical care provider statement:  ?  Critical care time (minutes):  30 ?  Critical care was necessary to treat or prevent imminent or life-threatening deterioration of the following conditions:  Sepsis ?  Critical care was time spent personally by me on the following activities:  Development of treatment plan with patient or surrogate, discussions with consultants, evaluation of patient's response to treatment, examination of patient, ordering and review of laboratory studies, ordering and review of radiographic studies, ordering and performing treatments and interventions, pulse oximetry, re-evaluation of patient's condition and review of old charts ? ? ?MEDICATIONS ORDERED IN  ED: ?Medications  ?sodium chloride 0.9 % bolus 500 mL (0 mLs Intravenous Stopped 11/15/21 2110)  ?cefTRIAXone (ROCEPHIN) 2 g in sodium chloride 0.9 % 100 mL IVPB (0 g Intravenous Stopped 11/15/21 2135)  ? ? ? ?IMPRESSION / MDM / ASSESSMENT AND PLAN / ED COURSE  ?I reviewed the triage vital signs and the nursing notes. ?             ?               ? ?Differential diagnosis includes, but is not limited to, sepsis, intercranial hemorrhage, UTI, COVID, flu.  We will give a little bit of fluids ? ?COVID, flu are negative.  His troponin is negative x2 lactate is normal.  White count significantly elevated so blood cultures were ordered.  Unclear source.  Urine without evidence of UTI.  Creatinine was slightly elevated so getting some fluids ? ? ?CT head is negative, CT neck negative ? ?IMPRESSION: ?1.  No acute intracranial abnormality. ?2. No acute displaced fracture or traumatic listhesis of the ?cervical spine. ?3.  Emphysema (ICD10-J43.9). ? ?8:57 PM as white count is elevated and his heart rate is technically over 90 so he technically meets sepsis criteria but he is very well-appearing.  On repeat examination he is denying any abdominal pain he is a little bit of redness on his left hand.  Discussed with family different options including blood cultures, lactate and admitting to the hospital but patient reports he is feeling much better and is requesting discharge home which I think is reasonable given someone can be around him over the next 24 hours.  They like to see how he does ambulating and if his lactate is normal and ambulating well they would like to go home and he can be called back if blood cultures are positive.  Would start him on some Keflex for a little bit of cellulitis on his left hand ? ?9:45 PM to stand patient up in with 2 patient assist patient was extremely weak and patient normally ambulates without any walker.  At this time they do not feel comfortable discharge home So will admit to the  hospital team ? ? ?  ? ?The patient is on the cardiac monitor to evaluate for evidence of arrhythmia and/or significant heart rate changes ? ? ?FINAL CLINICAL IMPRESSION(S) / ED DIAGNOSES  ? ?Final diagnoses:  ?Weakness  ?Sepsis,  due to unspecified organism, unspecified whether acute organ dysfunction present [facility])  ? ? ? ?Rx / DC Orders  ? ?ED Discharge Orders   ? ? None  ? ?  ? ? ? ?Note:  This document was prepared using Dragon voice recognition software and may include unintentional dictation errors. ?  ?Vanessa [place]allas[place], MD ?11/15/21 2302 ? ?

## 2021-11-16 ENCOUNTER — Observation Stay: Payer: Medicare HMO

## 2021-11-16 DIAGNOSIS — K5900 Constipation, unspecified: Secondary | ICD-10-CM

## 2021-11-16 DIAGNOSIS — E663 Overweight: Secondary | ICD-10-CM | POA: Diagnosis present

## 2021-11-16 DIAGNOSIS — Z888 Allergy status to other drugs, medicaments and biological substances status: Secondary | ICD-10-CM | POA: Diagnosis not present

## 2021-11-16 DIAGNOSIS — H409 Unspecified glaucoma: Secondary | ICD-10-CM | POA: Diagnosis present

## 2021-11-16 DIAGNOSIS — E785 Hyperlipidemia, unspecified: Secondary | ICD-10-CM | POA: Diagnosis present

## 2021-11-16 DIAGNOSIS — Z20822 Contact with and (suspected) exposure to covid-19: Secondary | ICD-10-CM | POA: Diagnosis present

## 2021-11-16 DIAGNOSIS — K219 Gastro-esophageal reflux disease without esophagitis: Secondary | ICD-10-CM | POA: Diagnosis present

## 2021-11-16 DIAGNOSIS — R509 Fever, unspecified: Secondary | ICD-10-CM | POA: Diagnosis not present

## 2021-11-16 DIAGNOSIS — Z981 Arthrodesis status: Secondary | ICD-10-CM | POA: Diagnosis not present

## 2021-11-16 DIAGNOSIS — E119 Type 2 diabetes mellitus without complications: Secondary | ICD-10-CM | POA: Diagnosis present

## 2021-11-16 DIAGNOSIS — E860 Dehydration: Secondary | ICD-10-CM | POA: Diagnosis present

## 2021-11-16 DIAGNOSIS — Z7982 Long term (current) use of aspirin: Secondary | ICD-10-CM | POA: Diagnosis not present

## 2021-11-16 DIAGNOSIS — Z8042 Family history of malignant neoplasm of prostate: Secondary | ICD-10-CM | POA: Diagnosis not present

## 2021-11-16 DIAGNOSIS — Z6827 Body mass index (BMI) 27.0-27.9, adult: Secondary | ICD-10-CM | POA: Diagnosis not present

## 2021-11-16 DIAGNOSIS — Z820 Family history of epilepsy and other diseases of the nervous system: Secondary | ICD-10-CM | POA: Diagnosis not present

## 2021-11-16 DIAGNOSIS — R6510 Systemic inflammatory response syndrome (SIRS) of non-infectious origin without acute organ dysfunction: Secondary | ICD-10-CM | POA: Diagnosis not present

## 2021-11-16 DIAGNOSIS — Z87891 Personal history of nicotine dependence: Secondary | ICD-10-CM | POA: Diagnosis not present

## 2021-11-16 DIAGNOSIS — W19XXXA Unspecified fall, initial encounter: Secondary | ICD-10-CM | POA: Diagnosis present

## 2021-11-16 DIAGNOSIS — I447 Left bundle-branch block, unspecified: Secondary | ICD-10-CM | POA: Diagnosis present

## 2021-11-16 DIAGNOSIS — Y92009 Unspecified place in unspecified non-institutional (private) residence as the place of occurrence of the external cause: Secondary | ICD-10-CM | POA: Diagnosis not present

## 2021-11-16 DIAGNOSIS — G9340 Encephalopathy, unspecified: Secondary | ICD-10-CM

## 2021-11-16 DIAGNOSIS — I10 Essential (primary) hypertension: Secondary | ICD-10-CM

## 2021-11-16 DIAGNOSIS — J438 Other emphysema: Secondary | ICD-10-CM | POA: Diagnosis present

## 2021-11-16 DIAGNOSIS — M47816 Spondylosis without myelopathy or radiculopathy, lumbar region: Secondary | ICD-10-CM | POA: Diagnosis not present

## 2021-11-16 DIAGNOSIS — Z9842 Cataract extraction status, left eye: Secondary | ICD-10-CM | POA: Diagnosis not present

## 2021-11-16 DIAGNOSIS — Z79899 Other long term (current) drug therapy: Secondary | ICD-10-CM | POA: Diagnosis not present

## 2021-11-16 DIAGNOSIS — G9341 Metabolic encephalopathy: Secondary | ICD-10-CM | POA: Diagnosis present

## 2021-11-16 DIAGNOSIS — R531 Weakness: Secondary | ICD-10-CM | POA: Diagnosis present

## 2021-11-16 DIAGNOSIS — Z85828 Personal history of other malignant neoplasm of skin: Secondary | ICD-10-CM | POA: Diagnosis not present

## 2021-11-16 DIAGNOSIS — Z8249 Family history of ischemic heart disease and other diseases of the circulatory system: Secondary | ICD-10-CM | POA: Diagnosis not present

## 2021-11-16 LAB — CBC
HCT: 43.4 % (ref 39.0–52.0)
Hemoglobin: 14.4 g/dL (ref 13.0–17.0)
MCH: 32.1 pg (ref 26.0–34.0)
MCHC: 33.2 g/dL (ref 30.0–36.0)
MCV: 96.9 fL (ref 80.0–100.0)
Platelets: 267 10*3/uL (ref 150–400)
RBC: 4.48 MIL/uL (ref 4.22–5.81)
RDW: 11.8 % (ref 11.5–15.5)
WBC: 18.9 10*3/uL — ABNORMAL HIGH (ref 4.0–10.5)
nRBC: 0 % (ref 0.0–0.2)

## 2021-11-16 LAB — GLUCOSE, CAPILLARY
Glucose-Capillary: 106 mg/dL — ABNORMAL HIGH (ref 70–99)
Glucose-Capillary: 74 mg/dL (ref 70–99)
Glucose-Capillary: 215 mg/dL — ABNORMAL HIGH (ref 70–99)
Glucose-Capillary: 104 mg/dL — ABNORMAL HIGH (ref 70–99)
Glucose-Capillary: 94 mg/dL (ref 70–99)

## 2021-11-16 LAB — BASIC METABOLIC PANEL
Anion gap: 8 (ref 5–15)
BUN: 23 mg/dL (ref 8–23)
CO2: 25 mmol/L (ref 22–32)
Calcium: 8.1 mg/dL — ABNORMAL LOW (ref 8.9–10.3)
Chloride: 103 mmol/L (ref 98–111)
Creatinine, Ser: 1.15 mg/dL (ref 0.61–1.24)
GFR, Estimated: >60 mL/min (ref >60)
Glucose, Bld: 118 mg/dL — ABNORMAL HIGH (ref 70–99)
Potassium: 3.6 mmol/L (ref 3.5–5.1)
Sodium: 136 mmol/L (ref 135–145)

## 2021-11-16 LAB — PROCALCITONIN
Procalcitonin: <0.1 ng/mL
Procalcitonin: <0.1 ng/mL

## 2021-11-16 LAB — LACTIC ACID, PLASMA: Lactic Acid, Venous: 1.3 mmol/L (ref 0.5–1.9)

## 2021-11-16 LAB — TECHNOLOGIST SMEAR REVIEW: Plt Morphology: NORMAL

## 2021-11-16 LAB — MAGNESIUM: Magnesium: 1.9 mg/dL (ref 1.7–2.4)

## 2021-11-16 MED ORDER — DOCUSATE SODIUM 100 MG PO CAPS
100.0000 mg | ORAL_CAPSULE | Freq: Two times a day (BID) | ORAL | Status: DC
  Administered 2021-11-16 – 2021-11-18 (×4): 100 mg via ORAL
  Filled 2021-11-16 (×4): qty 1

## 2021-11-16 MED ORDER — LACTULOSE 10 GM/15ML PO SOLN
10.0000 g | ORAL | Status: AC
  Administered 2021-11-16: 10 g via ORAL
  Filled 2021-11-16: qty 30

## 2021-11-16 MED ORDER — POLYETHYLENE GLYCOL 3350 17 G PO PACK
17.0000 g | PACK | Freq: Every day | ORAL | Status: DC
Start: 2021-11-16 — End: 2021-11-18
  Administered 2021-11-16 – 2021-11-17 (×2): 17 g via ORAL
  Filled 2021-11-16 (×2): qty 1

## 2021-11-16 MED ORDER — PIPERACILLIN-TAZOBACTAM 3.375 G IVPB
3.3750 g | Freq: Three times a day (TID) | INTRAVENOUS | Status: DC
  Administered 2021-11-16 – 2021-11-17 (×3): 3.375 g via INTRAVENOUS
  Filled 2021-11-16 (×3): qty 50

## 2021-11-16 NOTE — Assessment & Plan Note (Signed)
Meets criteria BMI greater than 25 

## 2021-11-16 NOTE — Assessment & Plan Note (Addendum)
Chronic.  Patient breathing comfortably on room air although was mildly tachypneic on admission.  Given normal procalcitonin, I would say this is not a source of infection. ?

## 2021-11-16 NOTE — Progress Notes (Signed)
Pharmacy Antibiotic Note ? ?Jeremy Valencia is a 81 y.o. male admitted on 11/15/2021 with  infection of unknown source . Attending believes it may be intra-abdominal bacteria. Pharmacy has been consulted for Zosyn dosing ? ?Plan: ?Zosyn 3.375g IV Q8 hours(EI) ? ?Height: '6\' 1"'$  (185.4 cm) ?Weight: 95.3 kg (210 lb) ?IBW/kg (Calculated) : 79.9 ? ?Temp (24hrs), Avg:99.7 ?F (37.6 ?C), Min:98 ?F (36.7 ?C), Max:100.9 ?F (38.3 ?C) ? ?Recent Labs  ?Lab 11/15/21 ?1925 11/15/21 ?2109 11/16/21 ?0009 11/16/21 ?9735  ?WBC 19.8*  --   --  18.9*  ?CREATININE 1.01  --   --  1.15  ?LATICACIDVEN  --  1.4 1.3  --   ? ?  ?Estimated Creatinine Clearance: 56 mL/min (by C-G formula based on SCr of 1.15 mg/dL).   ? ?Allergies  ?Allergen Reactions  ? Metformin Diarrhea  ? Rofecoxib Other (See Comments)  ?  Other reaction(s): Unknown  ? Lamisil [Terbinafine] Rash  ? ? ?Antimicrobials this admission: ?Vanc/CFP/MTZ 3/13  x1 day ?Zosyn 3/14  >>  ? ?Dose adjustments this admission: ? ? ?Microbiology results: ? 3/13 BCx: NGTD ?3/13 UCx:  pending ? ? ?Thank you for allowing pharmacy to be a part of this patient?s care. ? ?Pearla Dubonnet ?11/16/2021 2:47 PM ? ?

## 2021-11-16 NOTE — TOC Initial Note (Signed)
Transition of Care (TOC) - Initial/Assessment Note  ? ? ?Patient Details  ?Name: Jeremy Valencia ?MRN: [id number] ?Date of Birth: 1940-09-29 ? ?Transition of Care (TOC) CM/SW Contact:    ?Conception Oms, RN ?Phone Number: ?11/16/2021, 8:59 AM ? ?Clinical Narrative:     The patient come from home where he is independent and walks without assistance at baseline, He has family support with his son and daughter,    His wife is June [phone],         He gets medication from [employer], PCP is [facility], He drives at baseline, TOC to monitor for Possible HH needs and DME needs, PT to evaluate and make recommendation ? ? ?  ?  ? ? ?Patient Goals and CMS Choice ?  ?  ?  ? ?Expected Discharge Plan and Services ?  ?  ?  ?  ?  ?                ?  ?  ?  ?  ?  ?  ?  ?  ?  ?  ? ?Prior Living Arrangements/Services ?  ?  ?  ?       ?  ?  ?  ?  ? ?Activities of Daily Living ?  ?  ? ?Permission Sought/Granted ?  ?  ?   ?   ?   ?   ? ?Emotional Assessment ?  ?  ?  ?  ?  ?  ? ?Admission diagnosis:  Weakness [R53.1] ?General weakness [R53.1] ?Sepsis, due to unspecified organism, unspecified whether acute organ dysfunction present ([place]) [A41.9] ?Patient Active Problem List  ? Diagnosis Date Noted  ? General weakness 11/15/2021  ? Acute encephalopathy 11/15/2021  ? Other emphysema ([place]) 01/22/2018  ? Benign essential tremor 01/18/2018  ? 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 Tremor of hands and face 01/18/2018  ? SIRS (systemic inflammatory response syndrome) ([place]) 10/26/2017  ? 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 Persistent proteinuria 01/18/2016  ? Senile purpura ([place]) 01/18/2016  ? Type 2 diabetes mellitus ([place]) 06/19/2015  ? Microscopic hematuria 06/19/2015  ? Enlarged prostate 06/19/2015  ? 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 Diabetes mellitus, type 2 ([place]) 03/13/2015  ? DD (diverticular disease) 03/13/2015  ? Acid reflux 03/13/2015  ? Chronic LBP 01/08/2015  ? Essential (primary) hypertension  07/18/2014  ? Enthesopathy of ankle and tarsus 07/17/2014  ? Dupuytren's contracture of foot 07/17/2014  ? Capsulitis of right foot   ? Plantar fasciitis   ? Bursitis 05/16/2013  ? ?PCP:  Adin Hector, MD ?Pharmacy:   ?[facility] ?[place] [address] ?Phone: [phone] Fax: [phone] ? ? ? ? ?Social Determinants of Health (SDOH) Interventions ?  ? ?Readmission Risk Interventions ?No flowsheet data found. ? ? ?

## 2021-11-16 NOTE — Assessment & Plan Note (Addendum)
resolved 

## 2021-11-16 NOTE — Assessment & Plan Note (Addendum)
Unclear etiology.  Urine negative.  Procalcitonin lactic acid level normal.  Abdominal x-ray notes moderate amount of constipation.  Was on zosyn for gut coverage. ?Plan: ?--obtain RVP ?--d/c abx and monitor for recurrence of fever ?

## 2021-11-16 NOTE — Assessment & Plan Note (Signed)
Continue statin. 

## 2021-11-16 NOTE — Hospital Course (Addendum)
81 year old male with past medical history of COPD with emphysema, diabetes mellitus and hypertension admitted to the hospitalist service on 3/13 after presenting to the emergency room with fever and episodes of confusion.  In the emergency room, noted to have a white blood cell count of 19,000 although source of infection not seen.  He did have low-grade fever as high as 100.9 in the emergency room.  Procalcitonin and lactic acid level normal and blood cultures drawn and pending.  Patient started on broad-spectrum antibiotics. ? ?By following day, patient continues to have low-grade temperatures.  White blood cell count with little change from previous day.  Abdominal x-ray noted fair amount of stool in colon. ?

## 2021-11-16 NOTE — Assessment & Plan Note (Addendum)
Much improved, back to baseline as per daughter.  --unclear etiology. ?

## 2021-11-16 NOTE — Assessment & Plan Note (Addendum)
--   takes Jardiance at home ?--SSI for now ?

## 2021-11-16 NOTE — Assessment & Plan Note (Signed)
Continue eyedrops 

## 2021-11-16 NOTE — Assessment & Plan Note (Signed)
Blood pressure stable.  Continue hydralazine and Zestril. ?

## 2021-11-16 NOTE — Progress Notes (Signed)
[facility] Progress Note ? ?Patient: Jeremy Valencia    EPP:[id number]  DOA: 11/15/2021    ?Date of Service: the patient was seen and examined on 11/16/2021 ? ?Brief hospital course: ?81 year old male with past medical history of COPD with emphysema, diabetes mellitus and hypertension admitted to the hospitalist service on 3/13 after presenting to the emergency room with fever and episodes of confusion.  In the emergency room, noted to have a white blood cell count of 19,000 although source of infection not seen.  He did have low-grade fever as high as 100.9 in the emergency room.  Procalcitonin and lactic acid level normal and blood cultures drawn and pending.  Patient started on broad-spectrum antibiotics. ? ?By following day, patient continues to have low-grade temperatures.  White blood cell count with little change from previous day.  Abdominal x-ray noted fair amount of stool in colon. ? ?Assessment and Plan: ?Assessment and Plan: ?* SIRS (systemic inflammatory response syndrome) (HCC) ?Unclear etiology.  Manual review of peripheral smear.  Mentation appears to be improved.  Viral panel negative.  Urine negative.  Procalcitonin lactic acid level normal.  Abdominal x-ray notes moderate amount of constipation.  Is possible he could have stool causing bacterial translocation of the gut.  If so, will change antibiotics to Zosyn for gut coverage.  Other possibility is that he has an infection somewhere else which is made him dehydrated which caused his constipation.  I have examined him from head to toe and found no signs of cellulitis. ? ?Acute encephalopathy ?Much improved, back to baseline as per daughter.  Patient is alert and oriented x3 and no previous history of dementia.  Likely cause for encephalopathy was acute confusion caused by infection/dehydration. ? ?Constipation ?So far, moderate amount of stool noted in colon.  Patient tells me that he normally moves his bowels every day without any  assistance.  He says they have not moved however in the last 3 to 4 days.  Stated above, possible constipation could be the source of infection and if so, will treat with MiraLAX and adjust antibiotics for gut coverage versus infection from another source which caused him to be dehydrated and then therefore secondary constipation. ? ?Essential (primary) hypertension ?Blood pressure stable.  Continue hydralazine and Zestril. ? ?Other emphysema ([place]) ?Chronic.  Patient breathing comfortably on room air although was mildly tachypneic on admission.  Given normal procalcitonin, I would say this is not a source of infection. ? ?Type 2 diabetes mellitus ([place]) ?Checking A1c.  CBGs have been stable.  He takes Jardiance at home ? ?Hyperlipidemia ?Continue statin ? ?Glaucoma ?Continue eyedrops. ? ?Overweight (BMI 25.0-29.9) ?Meets criteria BMI greater than 25 ? ? ? ? ? ? ?Body mass index is 27.71 kg/m?.  ?  ?   ? ?Consultants: ?None ? ?Procedures: ?None ? ?Antimicrobials: ?IV cefepime 3/13 - 3/14 ?IV Zosyn 3/14-present ? ?Code Status: Full code ? ? ?Subjective: Patient awake and alert and oriented x3, no acute distress ? ?Objective: ?Patient continues to have low-grade fevers, as high as 100.9 ?Vitals:  ? 11/16/21 0655 11/16/21 0950  ?BP: 123/68 131/71  ?Pulse: 82 92  ?Resp: 18 16  ?Temp: 98 ?F (36.7 ?C) 100.2 ?F (37.9 ?C)  ?SpO2: 96% 94%  ? ? ?Intake/Output Summary (Last 24 hours) at 11/16/2021 1326 ?Last data filed at 11/16/2021 1020 ?Gross per 24 hour  ?Intake 480 ml  ?Output 1150 ml  ?Net -670 ml  ? ?Filed Weights  ? 11/15/21 1732  ?Weight: 95.3 kg  ? ?  Body mass index is 27.71 kg/m?. ? ?Exam: ? ?General: Alert and oriented x3, no acute distress ?HEENT: Normocephalic, atraumatic, mucous membranes slightly dry ?Cardiovascular: Regular rate and rhythm, S1-S2 ?Respiratory: Clear to auscultation bilaterally ?Abdomen: Soft, mild distention, nondistended, normal active bowel sounds ?Musculoskeletal: No clubbing or cyanosis or  edema ?Skin: No skin breaks, tears or lesions.  No signs of cellulitis.  Patient has a small bandage on his left wrist where he had a recent basal cell removed ?Psychiatry: Appropriate, no evidence of psychoses ?Neurology: No focal deficits ? ?Data Reviewed: ?Noted normal lactic acid and procalcitonin.  White blood cell count of 18.9, little change from previous day. ? ?Disposition:  ?Status is: Inpatient ?Still requiring inpatient services due to continued work-up for infection plus treatment with IV fluids and IV antibiotics.  Anticipate discharge in the next few days following improvement and stabilization. ?  ? ?Family Communication: Daughter at the bedside ?DVT Prophylaxis: ?enoxaparin (LOVENOX) injection 40 mg Start: 11/15/21 2330 ? ? ? ?Author: ?Annita Brod ,MD ?11/16/2021 1:26 PM ? ?To reach On-call, see care teams to locate the attending and reach out via [email or web address]. ?Between 7PM-7AM, please contact night-coverage ?If you still have difficulty reaching the attending provider, please page the [facility] (Director on Call) for [facility] on amion for assistance. ? ?

## 2021-11-17 DIAGNOSIS — R509 Fever, unspecified: Secondary | ICD-10-CM | POA: Diagnosis not present

## 2021-11-17 LAB — COMPREHENSIVE METABOLIC PANEL
ALT: 15 U/L (ref 0–44)
AST: 19 U/L (ref 15–41)
Albumin: 2.6 g/dL — ABNORMAL LOW (ref 3.5–5.0)
Alkaline Phosphatase: 35 U/L — ABNORMAL LOW (ref 38–126)
Anion gap: 3 — ABNORMAL LOW (ref 5–15)
BUN: 21 mg/dL (ref 8–23)
CO2: 28 mmol/L (ref 22–32)
Calcium: 7.8 mg/dL — ABNORMAL LOW (ref 8.9–10.3)
Chloride: 102 mmol/L (ref 98–111)
Creatinine, Ser: 1 mg/dL (ref 0.61–1.24)
GFR, Estimated: >60 mL/min (ref >60)
Glucose, Bld: 112 mg/dL — ABNORMAL HIGH (ref 70–99)
Potassium: 3.4 mmol/L — ABNORMAL LOW (ref 3.5–5.1)
Sodium: 133 mmol/L — ABNORMAL LOW (ref 135–145)
Total Bilirubin: 0.7 mg/dL (ref 0.3–1.2)
Total Protein: 6.1 g/dL — ABNORMAL LOW (ref 6.5–8.1)

## 2021-11-17 LAB — HEMOGLOBIN A1C
Hgb A1c MFr Bld: 7.4 % — ABNORMAL HIGH (ref 4.8–5.6)
Mean Plasma Glucose: 166 mg/dL

## 2021-11-17 LAB — CBC
HCT: 41.6 % (ref 39.0–52.0)
Hemoglobin: 13.8 g/dL (ref 13.0–17.0)
MCH: 31.6 pg (ref 26.0–34.0)
MCHC: 33.2 g/dL (ref 30.0–36.0)
MCV: 95.2 fL (ref 80.0–100.0)
Platelets: 265 10*3/uL (ref 150–400)
RBC: 4.37 MIL/uL (ref 4.22–5.81)
RDW: 11.8 % (ref 11.5–15.5)
WBC: 17.5 10*3/uL — ABNORMAL HIGH (ref 4.0–10.5)
nRBC: 0 % (ref 0.0–0.2)

## 2021-11-17 LAB — RESPIRATORY PANEL BY PCR

## 2021-11-17 LAB — GLUCOSE, CAPILLARY
Glucose-Capillary: 89 mg/dL (ref 70–99)
Glucose-Capillary: 222 mg/dL — ABNORMAL HIGH (ref 70–99)
Glucose-Capillary: 162 mg/dL — ABNORMAL HIGH (ref 70–99)
Glucose-Capillary: 133 mg/dL — ABNORMAL HIGH (ref 70–99)

## 2021-11-17 MED ORDER — POTASSIUM CHLORIDE CRYS ER 20 MEQ PO TBCR
40.0000 meq | EXTENDED_RELEASE_TABLET | Freq: Once | ORAL | Status: AC
  Administered 2021-11-17: 40 meq via ORAL
  Filled 2021-11-17: qty 2

## 2021-11-17 NOTE — TOC Progression Note (Signed)
Transition of Care (TOC) - Progression Note  ? ? ?Patient Details  ?Name: BRUCE CHURILLA ?MRN: [id number] ?Date of Birth: 1940-09-22 ? ?Transition of Care (TOC) CM/SW Contact  ?Conception Oms, RN ?Phone Number: ?11/17/2021, 8:50 AM ? ?Clinical Narrative:   The patient comes from home where he is independent, He is back to baseline and alert and oriented, No apparent TOC needs at this time ? ? ?Transition of Care (TOC) Screening Note ? ? ?Patient Details  ?Name: KALEO CONDREY ?Date of Birth: 11-24-1940 ? ? ?Transition of Care (TOC) CM/SW Contact:    ?Conception Oms, RN ?Phone Number: ?11/17/2021, 8:51 AM ? ? ? ?Transition of Care Department [facility]) has reviewed patient and no TOC needs have been identified at this time. We will continue to monitor patient advancement through interdisciplinary progression rounds. If new patient transition needs arise, please place a TOC consult. ?  ? ? ? ?Expected Discharge Plan: [place] ?Barriers to Discharge: Continued Medical Work up ? ?Expected Discharge Plan and Services ?Expected Discharge Plan: [place] ?  ?Discharge Planning Services: CM Consult ?  ?Living arrangements for the past 2 months: [place] ?                ?  ?  ?  ?  ?  ?  ?  ?  ?  ?  ? ? ?Social Determinants of Health (SDOH) Interventions ?  ? ?Readmission Risk Interventions ?No flowsheet data found. ? ?

## 2021-11-17 NOTE — Progress Notes (Signed)
[facility] Progress Note ? ?Patient: Jeremy Valencia    LTJ:[id number]  DOA: 11/15/2021    ?Date of Service: the patient was seen and examined on 11/17/2021 ? ?Brief hospital course: ?81 year old male with past medical history of COPD with emphysema, diabetes mellitus and hypertension admitted to the hospitalist service on 3/13 after presenting to the emergency room with fever and episodes of confusion.  In the emergency room, noted to have a white blood cell count of 19,000 although source of infection not seen.  He did have low-grade fever as high as 100.9 in the emergency room.  Procalcitonin and lactic acid level normal and blood cultures drawn and pending.  Patient started on broad-spectrum antibiotics. ? ?By following day, patient continues to have low-grade temperatures.  White blood cell count with little change from previous day.  Abdominal x-ray noted fair amount of stool in colon. ? ?Assessment and Plan: ?Assessment and Plan: ?* Fever ?Unclear etiology.  Urine negative.  Procalcitonin lactic acid level normal.  Abdominal x-ray notes moderate amount of constipation.  Was on zosyn for gut coverage. ?Plan: ?--obtain RVP ?--d/c abx and monitor for recurrence of fever ? ?Acute encephalopathy ?Much improved, back to baseline as per daughter.  --unclear etiology. ? ?Constipation ?--resolved.   ? ?Essential (primary) hypertension ?Blood pressure stable.  Continue hydralazine and Zestril. ? ?Other emphysema ([place]) ?Chronic.  Patient breathing comfortably on room air although was mildly tachypneic on admission.  Given normal procalcitonin, I would say this is not a source of infection. ? ?Type 2 diabetes mellitus ([place]) ?-- takes Jardiance at home ?--SSI for now ? ?Hyperlipidemia ?Continue statin ? ?Glaucoma ?Continue eyedrops. ? ?Overweight (BMI 25.0-29.9) ?Meets criteria BMI greater than 25 ? ? ? ? ? ? ?Body mass index is 27.71 kg/m?.  ?  ?   ? ?Consultants: ?None ? ?Procedures: ?None ? ?Antimicrobials: ?IV  cefepime 3/13 - 3/14 ?IV Zosyn 3/14-3/15 ? ?Code Status: Full code ? ? ?Subjective:  ?Pt reported he walked with no issue.  Denied dysuria.   ? ? ?Exam: ?Constitutional: NAD, AAOx3, sitting in recliner ?HEENT: conjunctivae and lids normal, EOMI ?CV: No cyanosis.   ?RESP: normal respiratory effort, on RA ?Extremities: No effusions, edema in BLE ?SKIN: warm, dry ?Neuro: II - XII grossly intact.   ?Psych: Normal mood and affect.   ? ? ?Data Reviewed: ? ?Disposition:  ?Status is: Inpatient ?Still requiring inpatient services due to:  monitor fever off of abx ?  ? ?Family Communication:  ?DVT Prophylaxis: ?enoxaparin (LOVENOX) injection 40 mg Start: 11/15/21 2330 ? ? ? ?Author: ?Enzo Bi ,MD ?11/17/2021 4:39 PM ? ?To reach On-call, see care teams to locate the attending and reach out via [email or web address]. ?Between 7PM-7AM, please contact night-coverage ?If you still have difficulty reaching the attending provider, please page the [facility] (Director on Call) for [facility] on amion for assistance. ? ?

## 2021-11-17 NOTE — Evaluation (Signed)
Physical Therapy Evaluation ?Patient Details ?Name: RATHANA VIVEROS ?MRN: [id number] ?DOB: 07-08-1941 ?Today's Date: 11/17/2021 ? ?History of Present Illness ? Joshawa Dubin is an 40yoM who comes to [facility] with fever, fatigue, fluctuating AMS. PMH: HTN, DM@, emphysema. PTA pt slid to ground, was unable to get up without assistance, then was lethargic again later.  ?Clinical Impression ? Pt admitted with above diagnosis. Pt currently with functional limitations due to the deficits listed below (see "PT Problem List"). Upon entry, pt in bed, awake and agreeable to participate, requesting assist to BR for BM.The pt is alert, pleasant, interactive, and able to provide info regarding prior level of function, both in tolerance and independence. Pt moving at near baseline for all basic mobility this date with some questionable hesitancy in hallway AMB, however her likely does not AMB unshod at baseline. Pt requires no physical assistance and no device for mobility. Pt does requires some assist with donning fresh depends in session which he steps into while standing. Pt needs some safety cues to bring awareness to IV line while performing sitting to/from standing transitions. Patient's performance this date reveals decreased ability and independence performing all basic mobility required for performance of activities of daily living. Pt requires additional DME, close physical assistance, and cues for safe participate in mobility. Pt will benefit from skilled PT intervention to increase independence and safety with basic mobility in preparation for discharge to the venue listed below.   ? ?   ?   ? ?Recommendations for follow up therapy are one component of a multi-disciplinary discharge planning process, led by the attending physician.  Recommendations may be updated based on patient status, additional functional criteria and insurance authorization. ? ?Follow Up Recommendations No PT follow up ? ?  ?Assistance Recommended at  Discharge None  ?Patient can return home with the following ?   ? ?  ?Equipment Recommendations None recommended by PT  ?Recommendations for Other Services ?    ?  ?Functional Status Assessment Patient has had a recent decline in their functional status and demonstrates the ability to make significant improvements in function in a reasonable and predictable amount of time.  ? ?  ?Precautions / Restrictions Precautions ?Precautions: Fall ?Restrictions ?Weight Bearing Restrictions: No  ? ?  ? ?Mobility ? Bed Mobility ?Overal bed mobility: Independent ?  ?  ?  ?  ?  ?  ?  ?  ? ?Transfers ?Overall transfer level: Independent ?Equipment used: None ?  ?  ?  ?  ?  ?  ?  ?  ?  ? ?Ambulation/Gait ?Ambulation/Gait assistance: Supervision, Modified independent (Device/Increase time) ?Gait Distance (Feet): 190 Feet ?Assistive device: None ?Gait Pattern/deviations: WFL(Within Functional Limits) ?Gait velocity: 0.27ms ?  ?  ?General Gait Details: pt AMB near baseline per his assessment, per son's observation (some slight hesitancy), no LOB ? ?Stairs ?  ?  ?  ?  ?  ? ?Wheelchair Mobility ?  ? ?Modified Rankin (Stroke Patients Only) ?  ? ?  ? ?Balance   ?  ?  ?  ?  ?  ?  ?  ?  ?  ?  ?  ?  ?  ?  ?  ?  ?  ?  ?   ? ? ? ?Pertinent Vitals/Pain Pain Assessment ?Pain Assessment: No/denies pain  ? ? ?Home Living Family/patient expects to be discharged to:: Private residence ?Living Arrangements: Spouse/significant other (wife has early dementia) ?Available Help at Discharge: Family (son and STR live  locally) ?Type of Home: House ?Home Access: Stairs to enter ?Entrance Stairs-Rails: None ?Entrance Stairs-Number of Steps: 1-2 ?  ?Home Layout: One level;Laundry or work area in basement (higher than current code steps, 1 flight) ?Home Equipment: Rolling Walker (2 wheels) (kneeling scooter) ?   ?  ?Prior Function Prior Level of Function : Independent/Modified Independent ?  ?  ?  ?  ?  ?  ?Mobility Comments: has neuropathy in feet which limits  comfort ?  ?  ? ? ?Hand Dominance  ?   ? ?  ?Extremity/Trunk Assessment  ?   ?  ? ?  ?  ? ?   ?Communication  ?    ?Cognition Arousal/Alertness: Awake/alert ?Behavior During Therapy: [facility] for tasks assessed/performed ?Overall Cognitive Status: Within Functional Limits for tasks assessed ?  ?  ?  ?  ?  ?  ?  ?  ?  ?  ?  ?  ?  ?  ?  ?  ?  ?  ?  ? ?  ?General Comments   ? ?  ?Exercises    ? ?Assessment/Plan  ?  ?PT Assessment Patient needs continued PT services  ?PT Problem List Decreased activity tolerance;Decreased mobility ? ?   ?  ?PT Treatment Interventions DME instruction;Balance training;Gait training;Stair training;Functional mobility training;Therapeutic activities;Therapeutic exercise;Patient/family education   ? ?PT Goals (Current goals can be found in the Care Plan section)  ?Acute Rehab PT Goals ?Patient Stated Goal: avoid deconditoning while admitted ?PT Goal Formulation: With family ?Time For Goal Achievement: 12/01/21 ?Potential to Achieve Goals: Good ? ?  ?Frequency Min 2X/week ?  ? ? ?Co-evaluation   ?  ?  ?  ?  ? ? ?  ?AM-PAC PT "6 Clicks" Mobility  ?Outcome Measure Help needed turning from your back to your side while in a flat bed without using bedrails?: None ?Help needed moving from lying on your back to sitting on the side of a flat bed without using bedrails?: None ?Help needed moving to and from a bed to a chair (including a wheelchair)?: None ?Help needed standing up from a chair using your arms (e.g., wheelchair or bedside chair)?: None ?Help needed to walk in hospital room?: A Little ?Help needed climbing 3-5 steps with a railing? : A Little ?6 Click Score: 22 ? ?  ?End of Session   ?Activity Tolerance: Patient tolerated treatment well;No increased pain ?Patient left: in chair;with family/visitor present;with call bell/phone within reach ?Nurse Communication: Mobility status ?PT Visit Diagnosis: Other abnormalities of gait and mobility (R26.89);Muscle weakness (generalized) (M62.81) ?   ? ?Time: 5038-8828 ?PT Time Calculation (min) (ACUTE ONLY): 28 min ? ? ?Charges:   PT Evaluation ?$PT Eval Low Complexity: 1 Low ?PT Treatments ?$Therapeutic Activity: 8-22 mins ?  ?   ? ?11:23 AM, 11/17/21 ?Etta Grandchild, PT, DPT ?Physical Therapist - [place] ?[facility]  ?[phone] (ASCOM)  ? ? ?Ole Lafon C ?11/17/2021, 11:20 AM ? ?

## 2021-11-18 LAB — BASIC METABOLIC PANEL
Anion gap: 9 (ref 5–15)
BUN: 21 mg/dL (ref 8–23)
CO2: 25 mmol/L (ref 22–32)
Calcium: 8.2 mg/dL — ABNORMAL LOW (ref 8.9–10.3)
Chloride: 103 mmol/L (ref 98–111)
Creatinine, Ser: 0.91 mg/dL (ref 0.61–1.24)
GFR, Estimated: >60 mL/min (ref >60)
Glucose, Bld: 115 mg/dL — ABNORMAL HIGH (ref 70–99)
Potassium: 3.8 mmol/L (ref 3.5–5.1)
Sodium: 137 mmol/L (ref 135–145)

## 2021-11-18 LAB — MAGNESIUM: Magnesium: 1.9 mg/dL (ref 1.7–2.4)

## 2021-11-18 LAB — URINE CULTURE: Culture: 30000 — AB

## 2021-11-18 LAB — CBC
HCT: 42.4 % (ref 39.0–52.0)
Hemoglobin: 14.4 g/dL (ref 13.0–17.0)
MCH: 32.3 pg (ref 26.0–34.0)
MCHC: 34 g/dL (ref 30.0–36.0)
MCV: 95.1 fL (ref 80.0–100.0)
Platelets: 300 10*3/uL (ref 150–400)
RBC: 4.46 MIL/uL (ref 4.22–5.81)
RDW: 11.8 % (ref 11.5–15.5)
WBC: 12.1 10*3/uL — ABNORMAL HIGH (ref 4.0–10.5)
nRBC: 0 % (ref 0.0–0.2)

## 2021-11-18 LAB — GLUCOSE, CAPILLARY: Glucose-Capillary: 111 mg/dL — ABNORMAL HIGH (ref 70–99)

## 2021-11-18 MED ORDER — AMOXICILLIN-POT CLAVULANATE 875-125 MG PO TABS
1.0000 | ORAL_TABLET | Freq: Two times a day (BID) | ORAL | 0 refills | Status: AC
Start: 2021-11-18 — End: 2021-11-23

## 2021-11-18 MED ORDER — POLYETHYLENE GLYCOL 3350 17 G PO PACK: 17.0000 g | PACK | Freq: Every day | ORAL | 0 refills | Status: DC

## 2021-11-18 MED ORDER — AMOXICILLIN-POT CLAVULANATE 875-125 MG PO TABS
1.0000 | ORAL_TABLET | Freq: Two times a day (BID) | ORAL | Status: DC
  Administered 2021-11-18: 1 via ORAL
  Filled 2021-11-18: qty 1

## 2021-11-18 NOTE — Discharge Summary (Signed)
? ?Physician Discharge Summary ? ? ?Jeremy Valencia  male DOB: October 16, 1940  ?[id number] ? ?PCP: Adin Hector, MD ? ?Admit date: 11/15/2021 ?Discharge date: 11/18/2021 ? ?Admitted From: home ?Disposition:  home ?Daughter updated at bedside prior to discharge. ? ?CODE STATUS: Full code ? ? ?Hospital Course:  ?For full details, please see H&P, progress notes, consult notes and ancillary notes.  ?Briefly,  ?Jeremy Valencia is a 81 year old male with past medical history of COPD with emphysema, diabetes mellitus and hypertension admitted to the hospitalist service on 3/13 after presenting to the emergency room with fever and episodes of confusion.   ? ?In the emergency room, noted to have a white blood cell count of 19,000 although source of infection not seen.  He did have low-grade fever as high as 100.9 in the emergency room.  Procalcitonin and lactic acid level normal and blood cultures neg.  Patient started on broad-spectrum antibiotics.  Abdominal x-ray noted fair amount of stool in colon. ?  ?* Fever and leukocytosis ?Unclear etiology.  Urine negative.  CXR clear.  Procalcitonin lactic acid level normal.  Covid, flu, RVP neg.  Pt had a prior hospitalization in Feb 2019 with similar presentation, with fever and leukocytosis with no clear source of infection and was treated with empiric abx. ?--Abdominal x-ray during current admission showed moderate amount of stool.  Pt was put on zosyn for gut coverage. ?--abx d/c'ed the day before discharge, and pt remained fever-free with WBC trending down to 12.1.   ?--After discussion with daughter, decision made to continue empiric Augmentin for 5 days after discharge.  Pt will return to ED if fever recurs.   ?  ?Acute metabolic encephalopathy ?--unclear etiology.  Per daughter, pt gets altered with minimum fever.   ?  ?Constipation ?--resolved.   ?  ?Essential (primary) hypertension ?Blood pressure stable.  Continue hydralazine, HCTZ and Zestril. ?  ?Emphysema  ([place]) ?Chronic.  Stable. ?  ?Type 2 diabetes mellitus (HCC) ?--A1c 7.4, which is within goal for his age. ?--SSI while inpatient.  Discharged back on home glimepiride and Jardiance. ?  ?Hyperlipidemia ?Continue statin ?  ?Glaucoma ?Continue eyedrops. ?  ?Overweight BMI 27.71 ? ? ?Discharge Diagnoses:  ?Principal Problem: ?  Fever ?Active Problems: ?  Acute encephalopathy ?  Constipation ?  Essential (primary) hypertension ?  Other emphysema ([place]) ?  Type 2 diabetes mellitus ([place]) ?  Hyperlipidemia ?  Glaucoma ?  Overweight (BMI 25.0-29.9) ? ? ?30 Day Unplanned Readmission Risk Score   ? ?Flowsheet Row ED to Hosp-Admission (Current) from 11/15/2021 in [facility] (1A)  ?30 Day Unplanned Readmission Risk Score (%) 11.96 Filed at 11/18/2021 0801  ? ?  ? ? This score is the patient's risk of an unplanned readmission within 30 days of being discharged (0 -100%). The score is based on dignosis, age, lab data, medications, orders, and past utilization.   ?Low:  0-14.9   Medium: 15-21.9   High: 22-29.9   Extreme: 30 and above ? ?  ? ?  ? ? ?Discharge Instructions: ? ?Allergies as of 11/18/2021   ? ?   Reactions  ? Metformin Diarrhea  ? Rofecoxib Other (See Comments)  ? Other reaction(s): Unknown  ? Lamisil [terbinafine] Rash  ? ?  ? ?  ?Medication List  ?  ? ?TAKE these medications   ? ?albuterol 108 (90 Base) MCG/ACT inhaler ?Commonly known as: VENTOLIN HFA ?Inhale 2 puffs into the lungs every 6 (six) hours  as needed for wheezing or shortness of breath. ?  ?amoxicillin-clavulanate 875-125 MG tablet ?Commonly known as: AUGMENTIN ?Take 1 tablet by mouth every 12 (twelve) hours for 5 days. ?  ?aspirin 81 MG tablet ?Take 81 mg by mouth daily. ?  ?atorvastatin 20 MG tablet ?Commonly known as: LIPITOR ?Take 20 mg by mouth daily. ?  ?co-enzyme Q-10 50 MG capsule ?Take 50 mg by mouth as directed. ?  ?colchicine 0.6 MG tablet ?Take 0.6 mg by mouth as needed. ?  ?Fish Oil 1000 MG Caps ?Take 3-4  capsules by mouth daily. ?  ?gabapentin 300 MG capsule ?Commonly known as: NEURONTIN ?TAKE 1 CAPSULE BY MOUTH THREE TIMES DAILY ?  ?glimepiride 4 MG tablet ?Commonly known as: AMARYL ?Take 4 mg by mouth every morning. ?  ?hydrALAZINE 25 MG tablet ?Commonly known as: APRESOLINE ?Take 25 mg by mouth 2 (two) times daily. ?  ?hydrochlorothiazide 25 MG tablet ?Commonly known as: HYDRODIURIL ?Take 25 mg by mouth 2 (two) times daily. ?  ?Jardiance 10 MG Tabs tablet ?Generic drug: empagliflozin ?Take 10 mg by mouth daily. ?  ?lisinopril 20 MG tablet ?Commonly known as: ZESTRIL ?Take 20 mg by mouth daily. ?  ?Multi-Vitamins Tabs ?Take 1 tablet by mouth daily. ?  ?polyethylene glycol 17 g packet ?Commonly known as: MIRALAX / GLYCOLAX ?Take 17 g by mouth daily. With a glass of fluid. ?  ?timolol 0.5 % ophthalmic solution ?Commonly known as: TIMOPTIC ?Place 1 drop into both eyes daily. ?  ?vitamin C 1000 MG tablet ?Take 1,000 mg by mouth daily. ?  ?Vitamin D3 25 MCG (1000 UT) Caps ?Take 1,000 Units by mouth daily. ?  ?Zinc Acetate 50 MG Caps ?Take 1 capsule by mouth daily. ?  ? ?  ? ? ? Follow-up Information   ? ? [name], MD Follow up in 1 week(s).   ?Specialty: Internal Medicine ?Contact information: ?[address] ?[place] [address] ?[phone] ? ? ?  ?  ? ?  ?  ? ?  ? ? ?Allergies  ?Allergen Reactions  ? Metformin Diarrhea  ? Rofecoxib Other (See Comments)  ?  Other reaction(s): Unknown  ? Lamisil [Terbinafine] Rash  ? ? ? ?The results of significant diagnostics from this hospitalization (including imaging, microbiology, ancillary and laboratory) are listed below for reference.  ? ?Consultations: ? ? ?Procedures/Studies: ?DG Chest 2 View ? ?Result Date: 11/15/2021 ?CLINICAL DATA:  Fall EXAM: CHEST - 2 VIEW COMPARISON:  10/26/2017 FINDINGS: Heart and mediastinal contours are within normal limits. No focal opacities or effusions. No acute bony abnormality. Aortic atherosclerosis. IMPRESSION: No active  cardiopulmonary disease. Electronically Signed   By: Rolm Baptise M.D.   On: 11/15/2021 18:44  ? ?CT HEAD WO CONTRAST (5MM) ? ?Result Date: 11/15/2021 ?CLINICAL DATA:  Head trauma, minor (Age >= 65y); Neck trauma (Age >= 65y). Per son, pt had a fever this afternoon. Pt is AANDOx4 and NAD. EXAM: CT HEAD WITHOUT CONTRAST CT CERVICAL SPINE WITHOUT CONTRAST TECHNIQUE: Multidetector CT imaging of the head and cervical spine was performed following the standard protocol without intravenous contrast. Multiplanar CT image reconstructions of the cervical spine were also generated. RADIATION DOSE REDUCTION: This exam was performed according to the departmental dose-optimization program which includes automated exposure control, adjustment of the mA and/or kV according to patient size and/or use of iterative reconstruction technique. COMPARISON:  CT head 11/18/2014 FINDINGS: CT HEAD FINDINGS BRAIN: BRAIN Cerebral ventricle sizes are concordant with the degree of cerebral volume  loss. Patchy and confluent areas of decreased attenuation are noted throughout the deep and periventricular white matter of the cerebral hemispheres bilaterally, compatible with chronic microvascular ischemic disease. No evidence of large-territorial acute infarction. No parenchymal hemorrhage. No mass lesion. No extra-axial collection. No mass effect or midline shift. No hydrocephalus. Basilar cisterns are patent. Vascular: No hyperdense vessel. Atherosclerotic calcifications are present within the cavernous internal carotid and vertebral arteries. Skull: No acute fracture or focal lesion. Sinuses/Orbits: Paranasal sinuses and mastoid air cells are clear. Right ocular prosthesis. The left is unremarkable. Other: None. CT CERVICAL SPINE FINDINGS Alignment: Grade 1 anterolisthesis of C4 on C5, C5 on C6, C6 on C7. Skull base and vertebrae: Multilevel mild degenerative changes of the spine. No acute fracture. No aggressive appearing focal osseous lesion or  focal pathologic process. Soft tissues and spinal canal: No prevertebral fluid or swelling. No visible canal hematoma. Upper chest: Emphysematous change. Other: Atherosclerotic plaque. IMPRESSION: 1.  No acute intracrani

## 2021-11-18 NOTE — TOC Progression Note (Addendum)
Transition of Care (TOC) - Progression Note  ? ? ?Patient Details  ?Name: Jeremy Valencia ?MRN: [id number] ?Date of Birth: February 09, 1941 ? ?Transition of Care (TOC) CM/SW Contact  ?Conception Oms, RN ?Phone Number: ?11/18/2021, 9:56 AM ? ?Clinical Narrative:   Daughter is requesting HH  ?I sent the referral to [facility] they accepted the patient for [facility] PT ? ? ? ?Expected Discharge Plan: Home/Self Care ?Barriers to Discharge: Continued Medical Work up ? ?Expected Discharge Plan and Services ?Expected Discharge Plan: Home/Self Care ?  ?Discharge Planning Services: CM Consult ?  ?Living arrangements for the past 2 months: [place] ?Expected Discharge Date: 11/18/21               ?  ?  ?  ?  ?  ?  ?  ?  ?  ?  ? ? ?Social Determinants of Health (SDOH) Interventions ?  ? ?Readmission Risk Interventions ?No flowsheet data found. ? ?

## 2021-11-18 NOTE — Plan of Care (Signed)
  Problem: Education: Goal: Knowledge of General Education information will improve Description: Including pain rating scale, medication(s)/side effects and non-pharmacologic comfort measures Outcome: Adequate for Discharge   Problem: Health Behavior/Discharge Planning: Goal: Ability to manage health-related needs will improve Outcome: Adequate for Discharge   Problem: Clinical Measurements: Goal: Ability to maintain clinical measurements within normal limits will improve Outcome: Adequate for Discharge Goal: Will remain free from infection Outcome: Adequate for Discharge Goal: Diagnostic test results will improve Outcome: Adequate for Discharge Goal: Respiratory complications will improve Outcome: Adequate for Discharge Goal: Cardiovascular complication will be avoided Outcome: Adequate for Discharge   Problem: Clinical Measurements: Goal: Will remain free from infection Outcome: Adequate for Discharge   Problem: Clinical Measurements: Goal: Diagnostic test results will improve Outcome: Adequate for Discharge   Problem: Clinical Measurements: Goal: Respiratory complications will improve Outcome: Adequate for Discharge   Problem: Clinical Measurements: Goal: Cardiovascular complication will be avoided Outcome: Adequate for Discharge   

## 2021-11-20 LAB — CULTURE, BLOOD (ROUTINE X 2)
Culture: NO GROWTH
Culture: NO GROWTH

## 2021-11-26 DIAGNOSIS — J439 Emphysema, unspecified: Secondary | ICD-10-CM | POA: Diagnosis not present

## 2021-11-26 DIAGNOSIS — M545 Low back pain, unspecified: Secondary | ICD-10-CM | POA: Diagnosis not present

## 2021-11-26 DIAGNOSIS — N390 Urinary tract infection, site not specified: Secondary | ICD-10-CM | POA: Diagnosis not present

## 2021-11-26 DIAGNOSIS — R801 Persistent proteinuria, unspecified: Secondary | ICD-10-CM | POA: Diagnosis not present

## 2021-11-26 DIAGNOSIS — D692 Other nonthrombocytopenic purpura: Secondary | ICD-10-CM | POA: Diagnosis not present

## 2021-11-26 DIAGNOSIS — K219 Gastro-esophageal reflux disease without esophagitis: Secondary | ICD-10-CM | POA: Diagnosis not present

## 2021-11-26 DIAGNOSIS — I10 Essential (primary) hypertension: Secondary | ICD-10-CM | POA: Diagnosis not present

## 2021-11-26 DIAGNOSIS — E1142 Type 2 diabetes mellitus with diabetic polyneuropathy: Secondary | ICD-10-CM | POA: Diagnosis not present

## 2021-11-26 DIAGNOSIS — B952 Enterococcus as the cause of diseases classified elsewhere: Secondary | ICD-10-CM | POA: Diagnosis not present

## 2022-01-06 DIAGNOSIS — H524 Presbyopia: Secondary | ICD-10-CM | POA: Diagnosis not present

## 2022-01-26 DIAGNOSIS — D692 Other nonthrombocytopenic purpura: Secondary | ICD-10-CM | POA: Diagnosis not present

## 2022-01-26 DIAGNOSIS — E118 Type 2 diabetes mellitus with unspecified complications: Secondary | ICD-10-CM | POA: Diagnosis not present

## 2022-01-26 DIAGNOSIS — E790 Hyperuricemia without signs of inflammatory arthritis and tophaceous disease: Secondary | ICD-10-CM | POA: Diagnosis not present

## 2022-01-26 DIAGNOSIS — E785 Hyperlipidemia, unspecified: Secondary | ICD-10-CM | POA: Diagnosis not present

## 2022-01-26 DIAGNOSIS — K219 Gastro-esophageal reflux disease without esophagitis: Secondary | ICD-10-CM | POA: Diagnosis not present

## 2022-01-26 DIAGNOSIS — G250 Essential tremor: Secondary | ICD-10-CM | POA: Diagnosis not present

## 2022-02-02 DIAGNOSIS — I10 Essential (primary) hypertension: Secondary | ICD-10-CM | POA: Diagnosis not present

## 2022-02-02 DIAGNOSIS — E1129 Type 2 diabetes mellitus with other diabetic kidney complication: Secondary | ICD-10-CM | POA: Diagnosis not present

## 2022-02-02 DIAGNOSIS — M5136 Other intervertebral disc degeneration, lumbar region: Secondary | ICD-10-CM | POA: Diagnosis not present

## 2022-02-02 DIAGNOSIS — J449 Chronic obstructive pulmonary disease, unspecified: Secondary | ICD-10-CM | POA: Diagnosis not present

## 2022-02-02 DIAGNOSIS — Z87891 Personal history of nicotine dependence: Secondary | ICD-10-CM | POA: Diagnosis not present

## 2022-02-02 DIAGNOSIS — E785 Hyperlipidemia, unspecified: Secondary | ICD-10-CM | POA: Diagnosis not present

## 2022-02-02 DIAGNOSIS — K219 Gastro-esophageal reflux disease without esophagitis: Secondary | ICD-10-CM | POA: Diagnosis not present

## 2022-02-02 DIAGNOSIS — E1140 Type 2 diabetes mellitus with diabetic neuropathy, unspecified: Secondary | ICD-10-CM | POA: Diagnosis not present

## 2022-02-02 DIAGNOSIS — D692 Other nonthrombocytopenic purpura: Secondary | ICD-10-CM | POA: Diagnosis not present

## 2022-02-08 ENCOUNTER — Other Ambulatory Visit: Payer: Self-pay | Admitting: Podiatry

## 2022-04-14 DIAGNOSIS — D2262 Melanocytic nevi of left upper limb, including shoulder: Secondary | ICD-10-CM | POA: Diagnosis not present

## 2022-04-14 DIAGNOSIS — Z85820 Personal history of malignant melanoma of skin: Secondary | ICD-10-CM | POA: Diagnosis not present

## 2022-04-14 DIAGNOSIS — Z85828 Personal history of other malignant neoplasm of skin: Secondary | ICD-10-CM | POA: Diagnosis not present

## 2022-04-14 DIAGNOSIS — L570 Actinic keratosis: Secondary | ICD-10-CM | POA: Diagnosis not present

## 2022-04-14 DIAGNOSIS — D2271 Melanocytic nevi of right lower limb, including hip: Secondary | ICD-10-CM | POA: Diagnosis not present

## 2022-04-14 DIAGNOSIS — D2261 Melanocytic nevi of right upper limb, including shoulder: Secondary | ICD-10-CM | POA: Diagnosis not present

## 2022-04-14 DIAGNOSIS — X32XXXA Exposure to sunlight, initial encounter: Secondary | ICD-10-CM | POA: Diagnosis not present

## 2022-04-25 DIAGNOSIS — H401121 Primary open-angle glaucoma, left eye, mild stage: Secondary | ICD-10-CM | POA: Diagnosis not present

## 2022-04-25 DIAGNOSIS — E113292 Type 2 diabetes mellitus with mild nonproliferative diabetic retinopathy without macular edema, left eye: Secondary | ICD-10-CM | POA: Diagnosis not present

## 2022-05-01 ENCOUNTER — Other Ambulatory Visit: Payer: Self-pay | Admitting: Podiatry

## 2022-05-02 ENCOUNTER — Ambulatory Visit: Payer: Medicare HMO | Admitting: Podiatry

## 2022-05-02 DIAGNOSIS — D2371 Other benign neoplasm of skin of right lower limb, including hip: Secondary | ICD-10-CM | POA: Diagnosis not present

## 2022-05-02 DIAGNOSIS — M7751 Other enthesopathy of right foot: Secondary | ICD-10-CM | POA: Diagnosis not present

## 2022-05-02 MED ORDER — TRIAMCINOLONE ACETONIDE 40 MG/ML IJ SUSP
20.0000 mg | Freq: Once | INTRAMUSCULAR | Status: AC
  Administered 2022-05-02: 20 mg

## 2022-05-02 NOTE — Progress Notes (Signed)
He presents today chief complaint of painful right ankle as he points to the subtalar joint area.  He is also complaining of a painful callus beneath the first metatarsal of the right foot otherwise states that he has been doing pretty well.  Objective: Vital signs are stable he is alert and oriented x3.  Pulses are palpable.  There is no erythema edema cellulitis drainage or odor he has pain on end range of motion of the subtalar joint of the right foot with pain palpation of the sinus tarsi.  Tendons appear to be normal.  He does however have a loss of fat pad to the forefoot which is resulting in a reactive lesion to the area just beneath the sesamoids.  Assessment: Chronic neuropathy.  Also painful benign skin lesion.  Subtalar joint capsulitis right.  Plan: Discussed etiology pathology conservative versus surgical therapies.  At this point I injected the subtalar joint today 20 mg Kenalog 5 mg Marcaine point maximal tenderness.  Also debrided the benign skin lesion.  Discussed appropriate shoe gear stretching exercise ice therapy shoe modifications and I like to follow-up with him again in 6 months.  And we reordered his gabapentin.

## 2022-07-25 IMAGING — CR DG CHEST 2V
2 series · 2 of 2 positions shown · non-contrast
Comparison: 10/26/2017

CLINICAL DATA: Fall

EXAM:
CHEST - 2 VIEW

[chest lat]
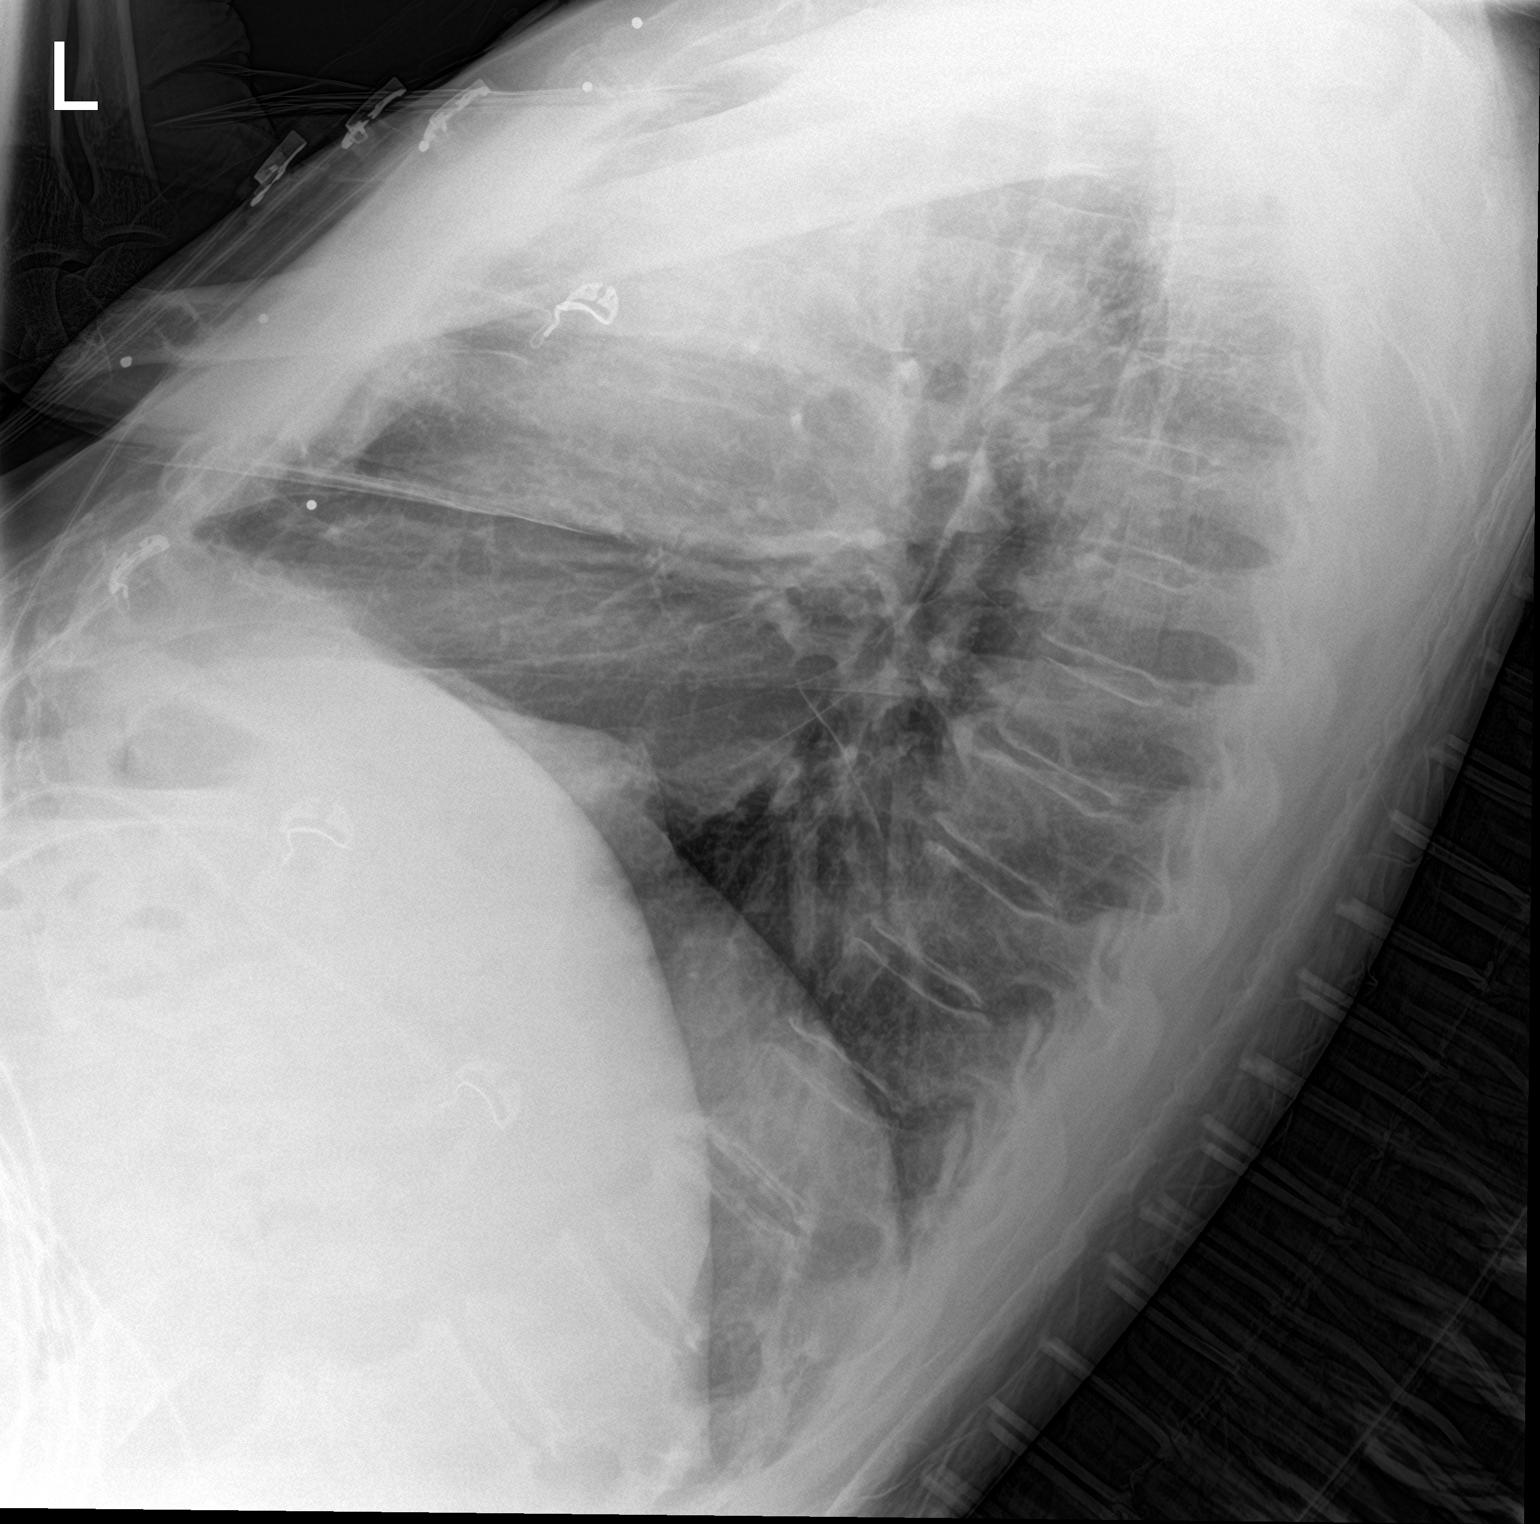

[chest ap]
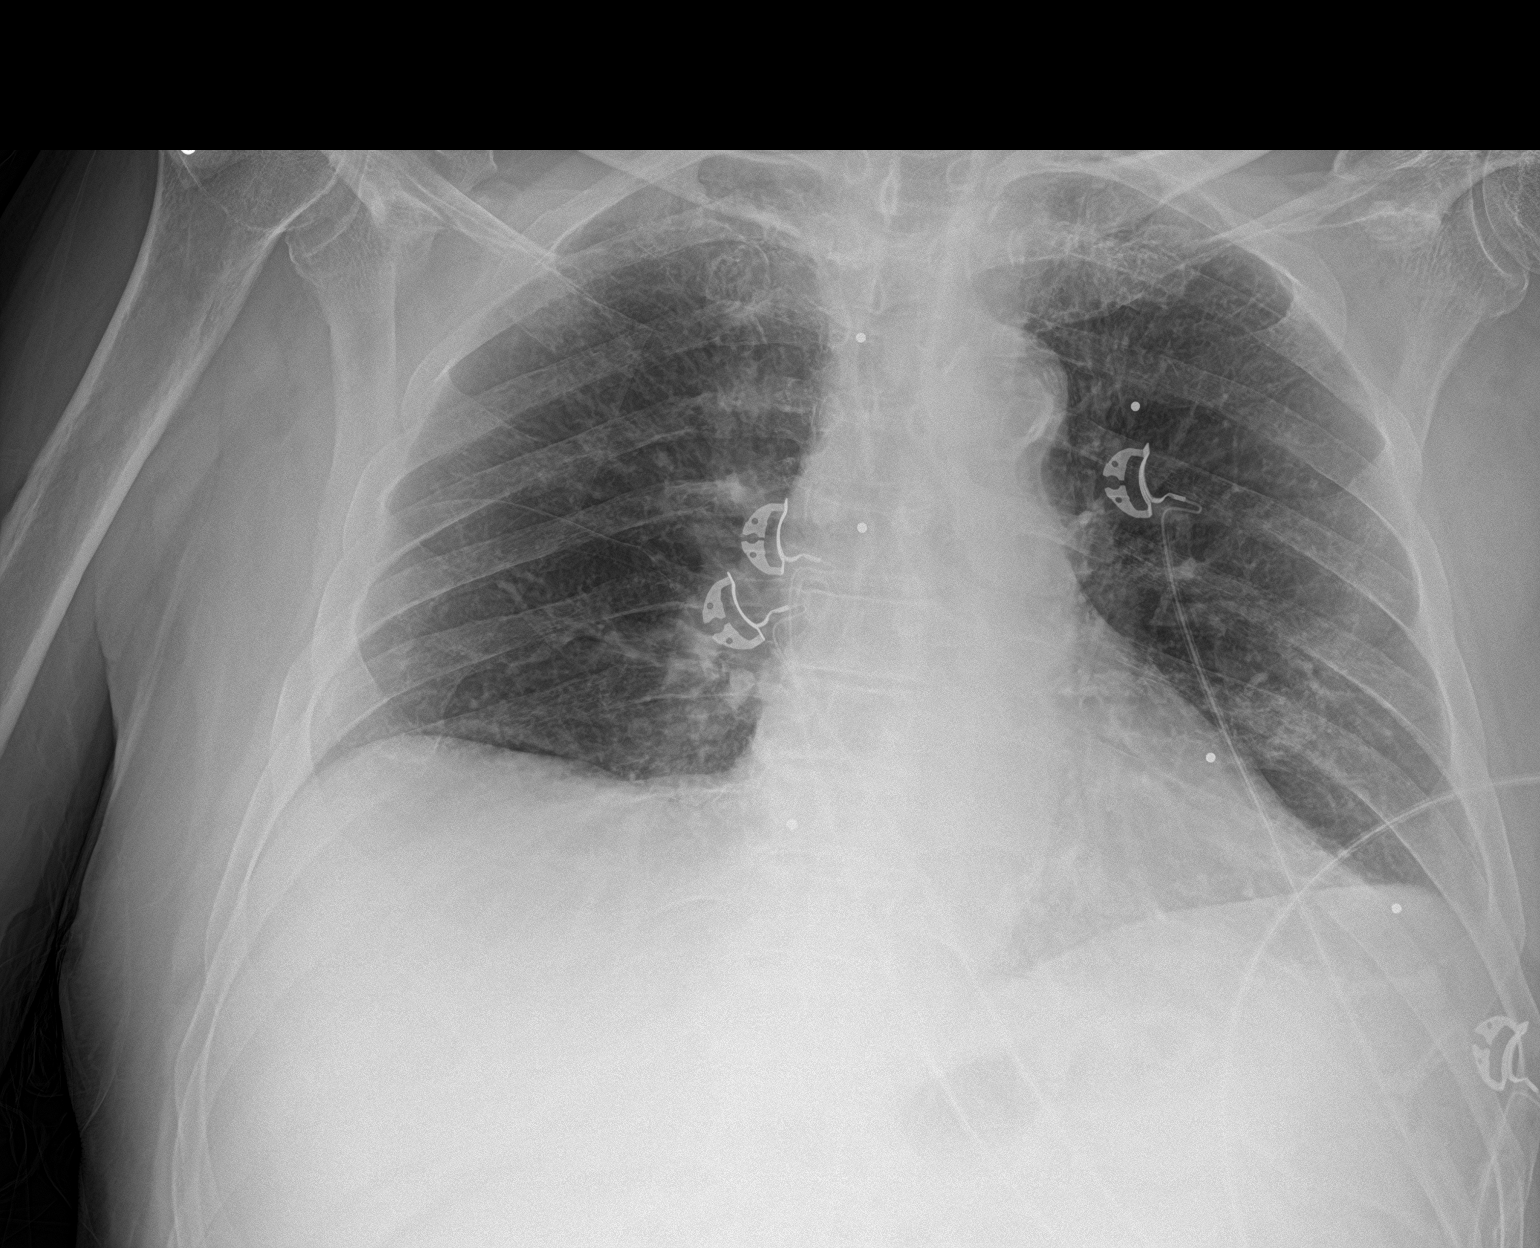

[2 of 2 positions shown; findings below may reference images not displayed]

FINDINGS: Heart and mediastinal contours are within normal limits. No focal
opacities or effusions. No acute bony abnormality. Aortic
atherosclerosis.
IMPRESSION: No active cardiopulmonary disease.

## 2022-07-25 IMAGING — CT CT CERVICAL SPINE W/O CM
3 of 4 series · 10 of 34 positions shown, 12 images · non-contrast
Comparison: CT head 11/18/2014

CLINICAL DATA: Head trauma, minor (Age >= 65y); Neck trauma (Age >=
65y). Per son, pt had a fever this afternoon. Pt is 77HHQxF and NAD.



[Series 5: sag bone · sagittal · 0.29mm/px · 5 of 84 slices shown, 6 images]
[im 28/84  bone]
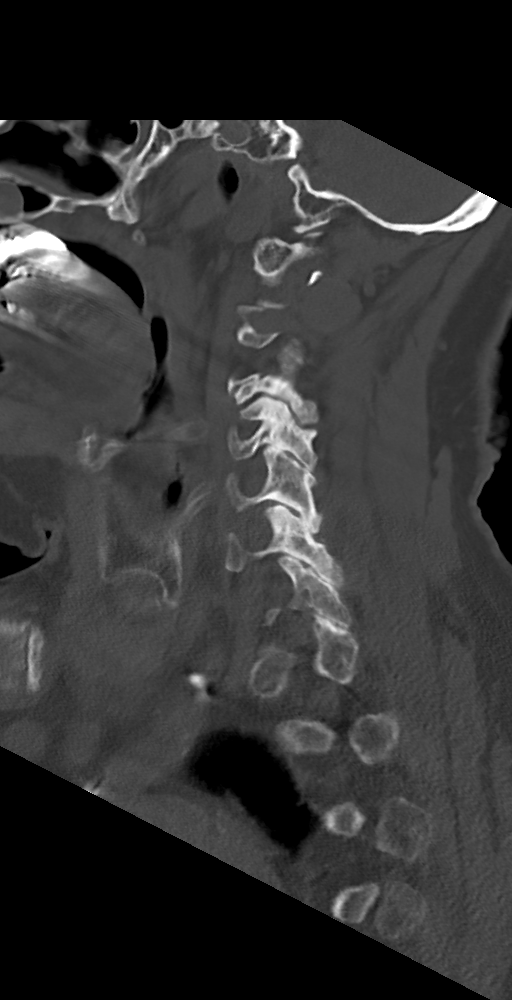
[im 35/84  bone]
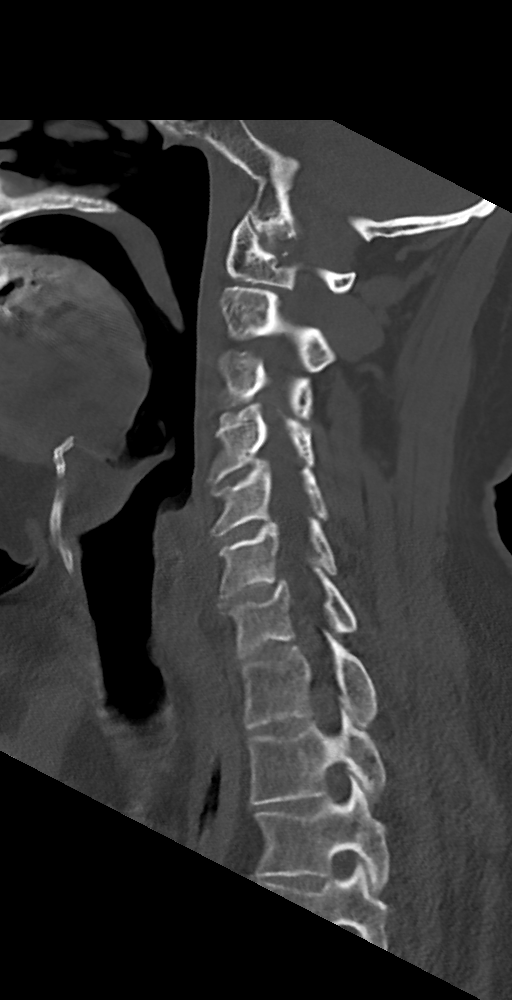
[im 42/84  soft-tissue]
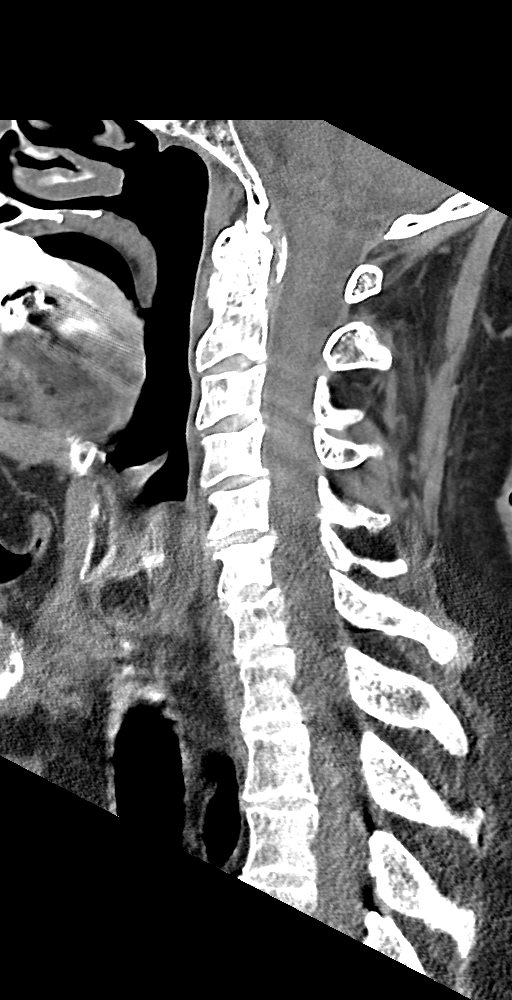
[im 42/84  bone]
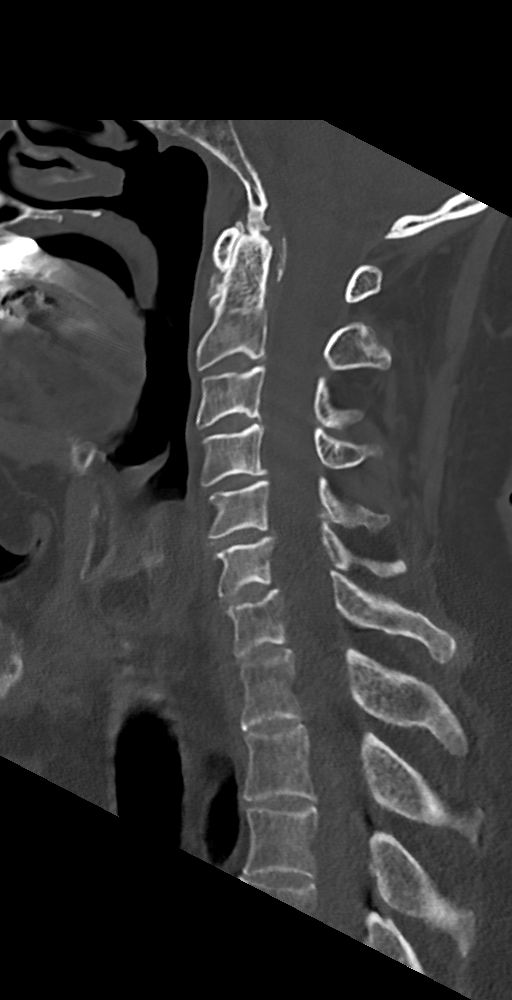
[im 49/84  bone]
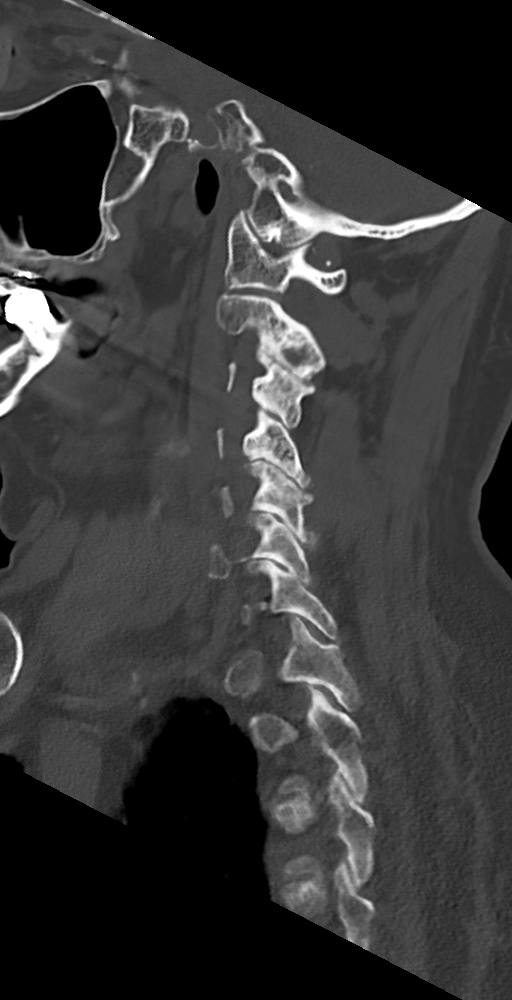
[im 56/84  bone]
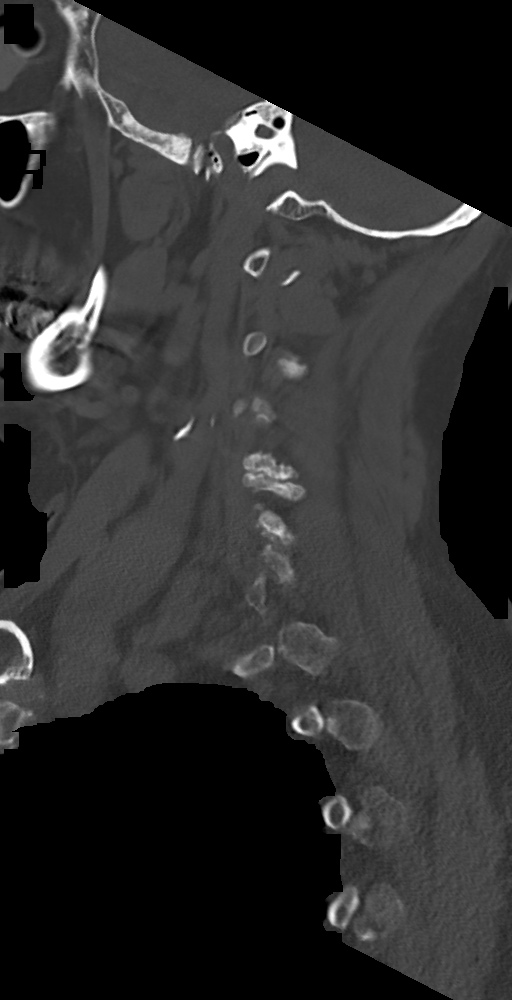

[Series 6: cor bone · coronal · 0.32mm/px · 3 of 87 slices shown]
[im 28/87  bone]
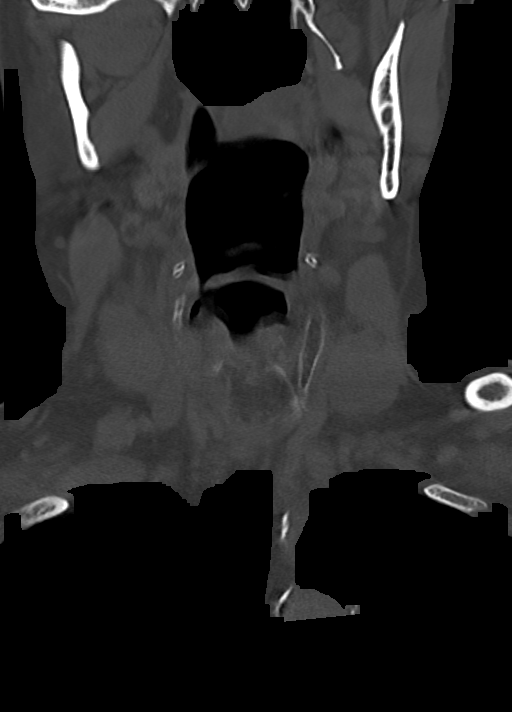
[im 38/87  bone]
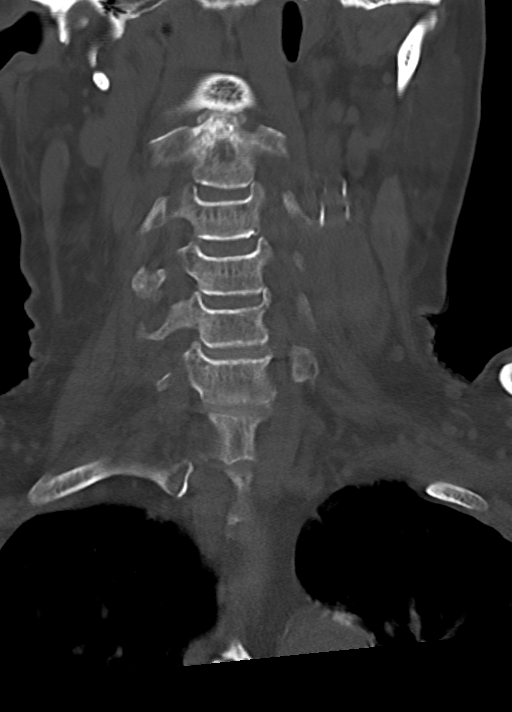
[im 49/87  bone]
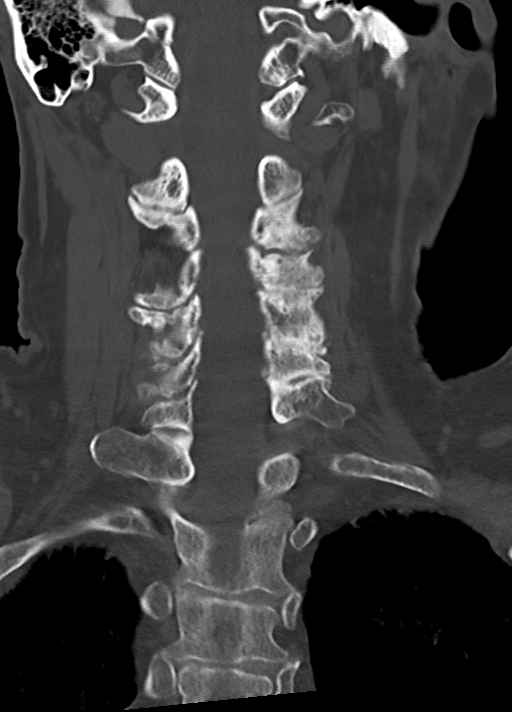

[Series 7: orthogonal axials · axial · 0.23mm/px · z∈[-246,-186]mm · 2 of 104 slices shown, 3 images]
[im 35/104  soft-tissue]
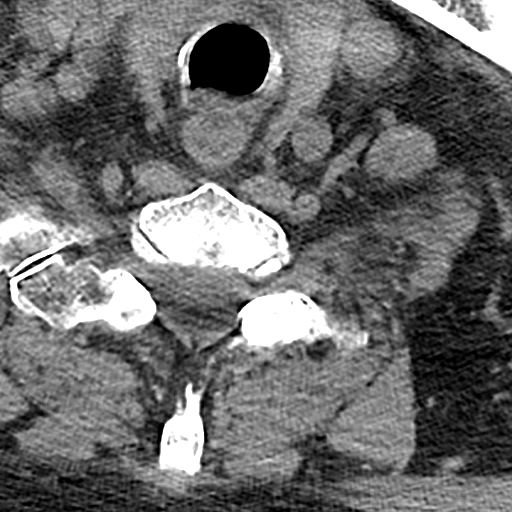
[im 35/104  bone]
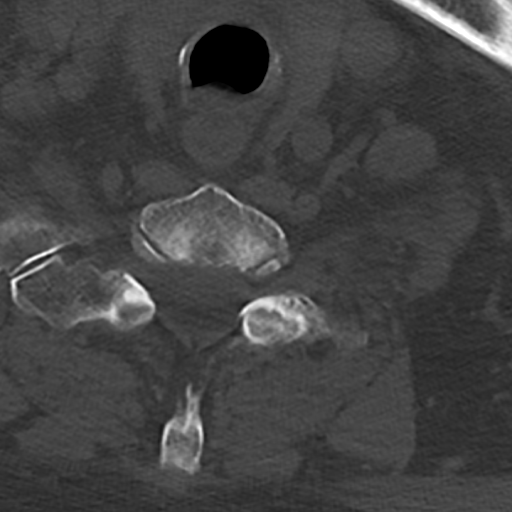
[im 69/104  bone]
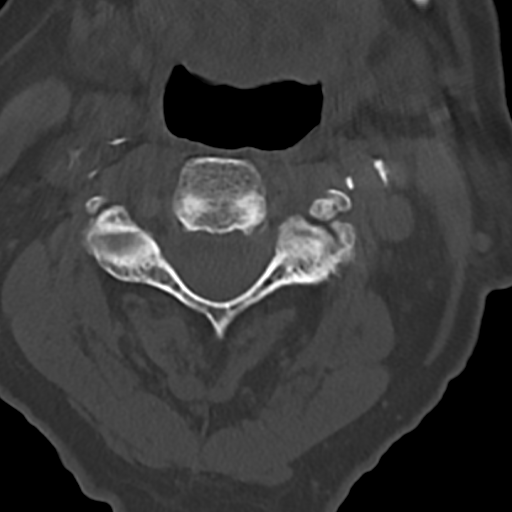

[10 of 34 positions shown; findings below may reference images not displayed]

FINDINGS: CT HEAD FINDINGS

BRAIN:
BRAIN
Cerebral ventricle sizes are concordant with the degree of cerebral
volume loss. Patchy and confluent areas of decreased attenuation are
noted throughout the deep and periventricular white matter of the
cerebral hemispheres bilaterally, compatible with chronic
microvascular ischemic disease.

No evidence of large-territorial acute infarction. No parenchymal
hemorrhage. No mass lesion. No extra-axial collection.

No mass effect or midline shift. No hydrocephalus. Basilar cisterns
are patent.

Vascular: No hyperdense vessel. Atherosclerotic calcifications are
present within the cavernous internal carotid and vertebral
arteries.

Skull: No acute fracture or focal lesion.

Sinuses/Orbits: Paranasal sinuses and mastoid air cells are clear.
Right ocular prosthesis. The left is unremarkable.

Other: None.

CT CERVICAL SPINE FINDINGS

Alignment: Grade 1 anterolisthesis of C4 on C5, C5 on C6, C6 on C7.

Skull base and vertebrae: Multilevel mild degenerative changes of
the spine. No acute fracture. No aggressive appearing focal osseous
lesion or focal pathologic process.

Soft tissues and spinal canal: No prevertebral fluid or swelling. No
visible canal hematoma.

Upper chest: Emphysematous change.

Other: Atherosclerotic plaque.
IMPRESSION: 1.  No acute intracranial abnormality.
2. No acute displaced fracture or traumatic listhesis of the
cervical spine.
3.  Emphysema (16R2O-LSX.3).

## 2022-07-25 IMAGING — CT CT HEAD W/O CM
4 series · 15 of 47 positions shown, 17 images · non-contrast
Comparison: CT head 11/18/2014

CLINICAL DATA: Head trauma, minor (Age >= 65y); Neck trauma (Age >=
65y). Per son, pt had a fever this afternoon. Pt is 77HHQxF and NAD.



[Series 2: head wo · axial · 0.46mm/px · z∈[-95,+25]mm · 7 of 33 slices shown, 9 images]
[im 5/33  brain]
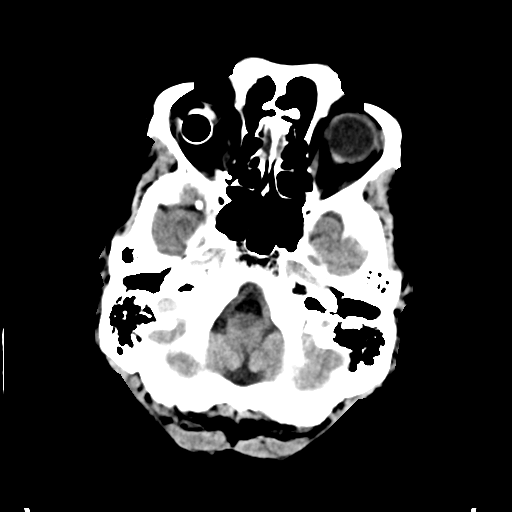
[im 5/33  bone]
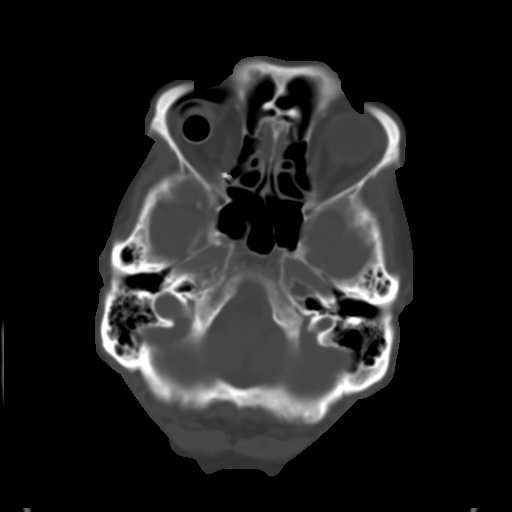
[im 9/33  brain]
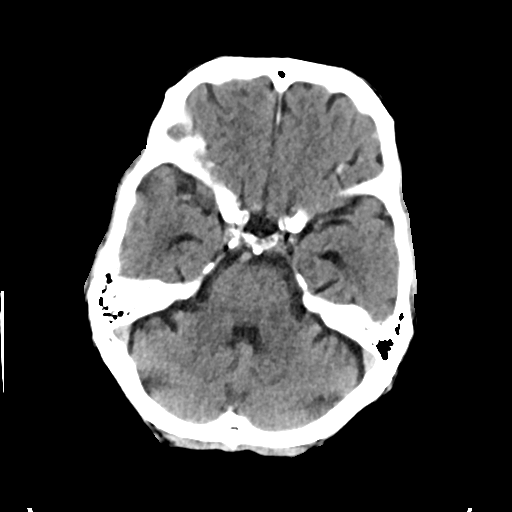
[im 13/33  brain]
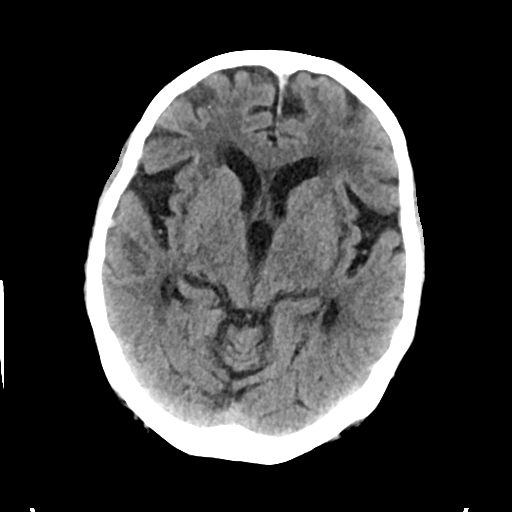
[im 17/33  brain]
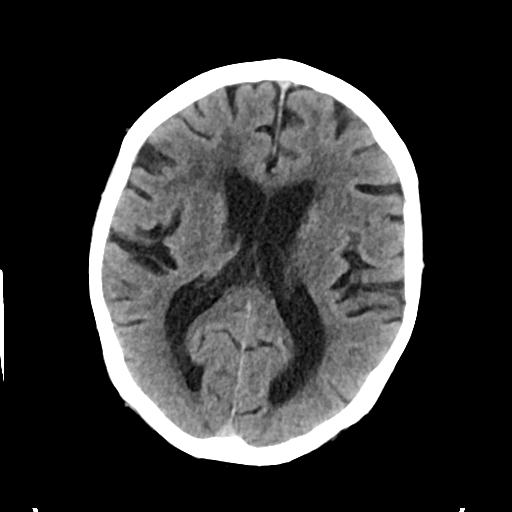
[im 21/33  brain]
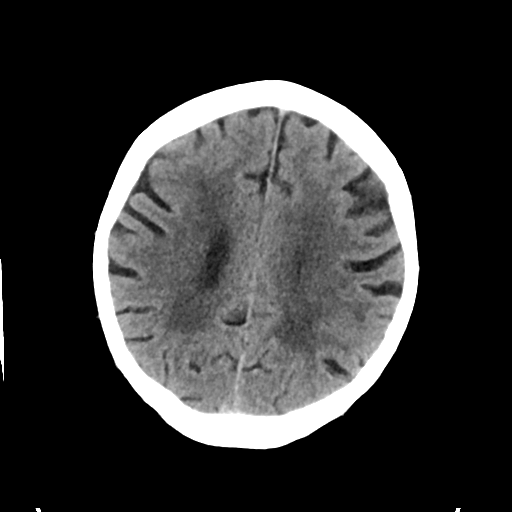
[im 21/33  bone]
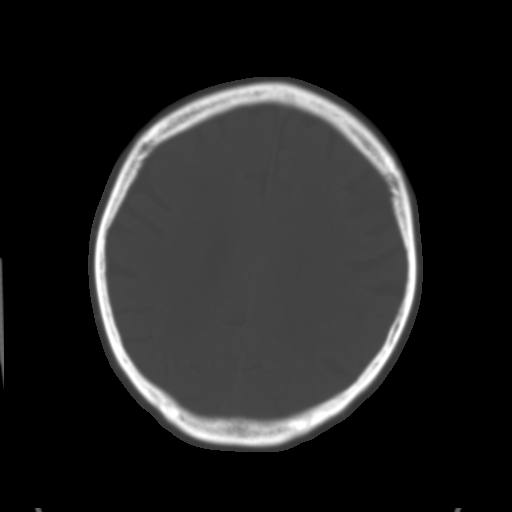
[im 25/33  brain]
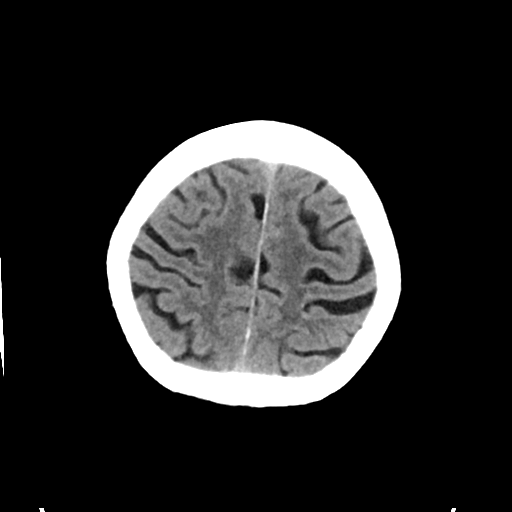
[im 29/33  brain]
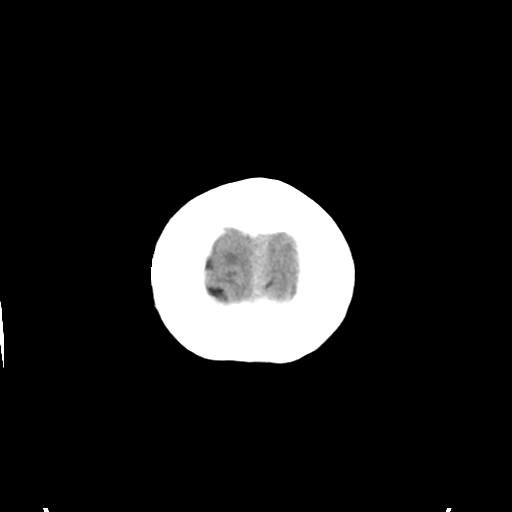

[Series 3: head bone · axial · 0.46mm/px · z∈[-99,-83]mm · 2 of 83 slices shown]
[im 9/83  bone]
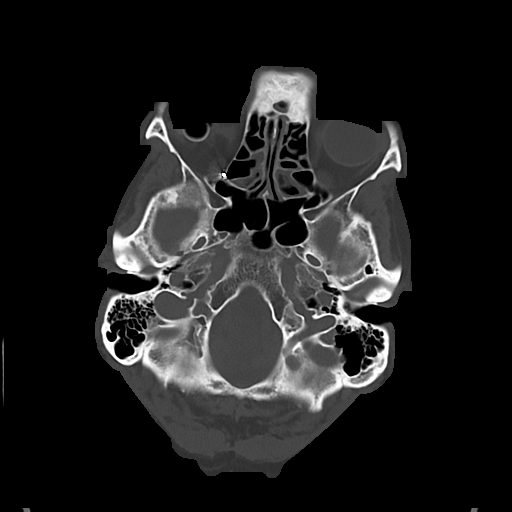
[im 17/83  bone]
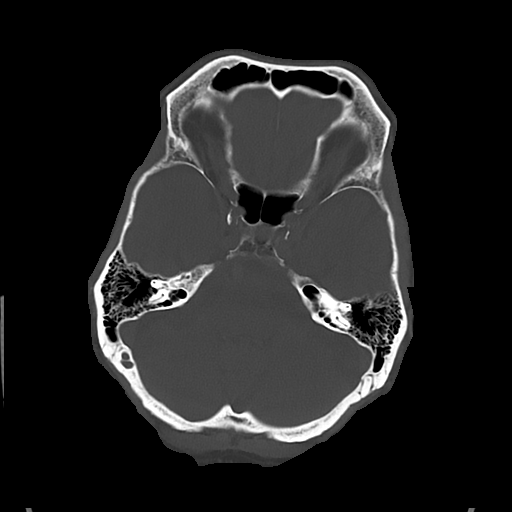

[Series 4: cor soft · coronal · 0.32mm/px · 3 of 69 slices shown]
[im 23/69  brain]
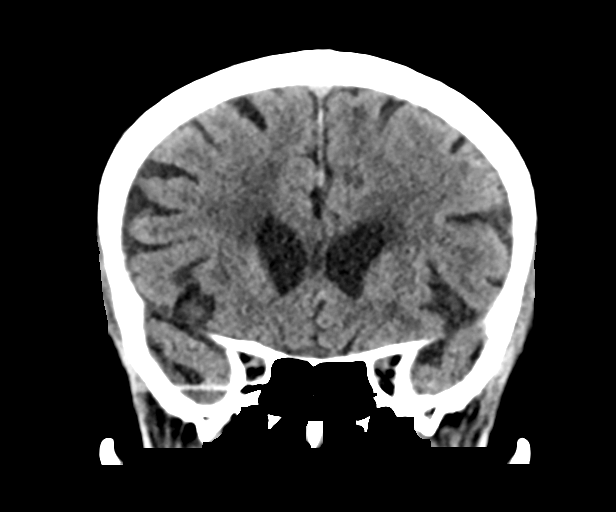
[im 31/69  brain]
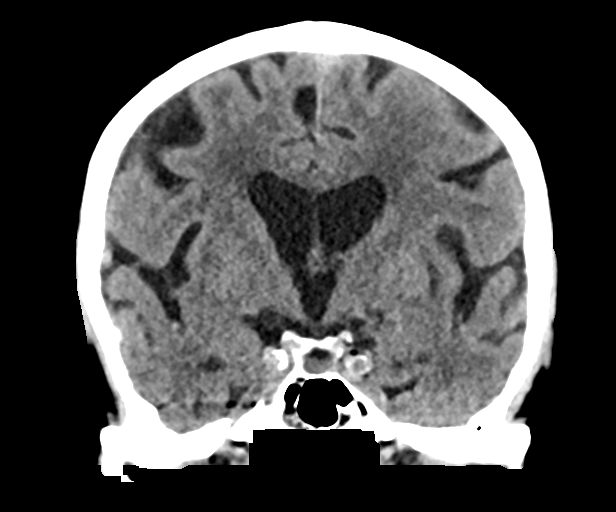
[im 38/69  brain]
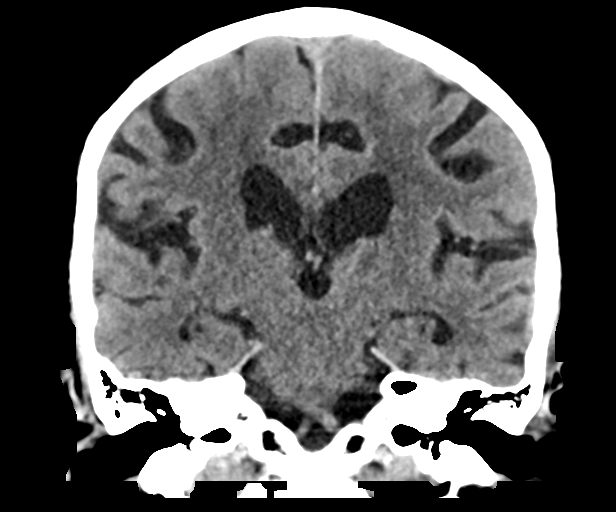

[Series 5: sag soft · sagittal · 0.32mm/px · 3 of 66 slices shown]
[im 22/66  brain]
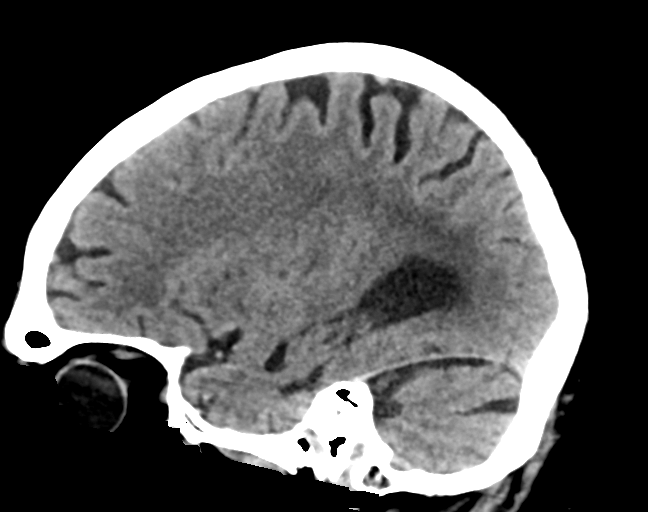
[im 33/66  brain]
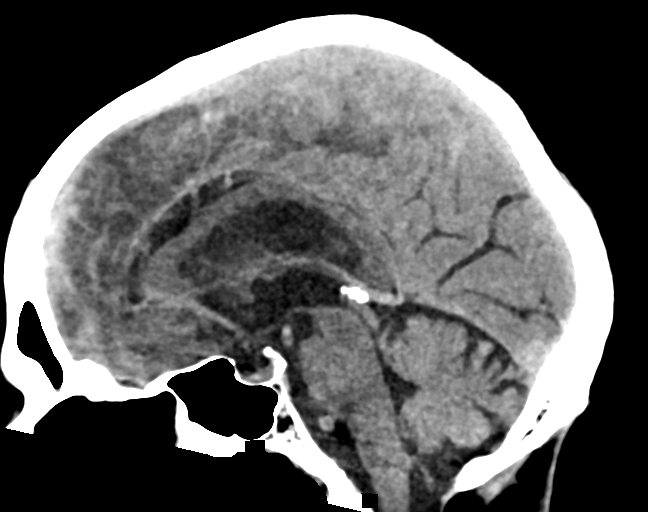
[im 44/66  brain]
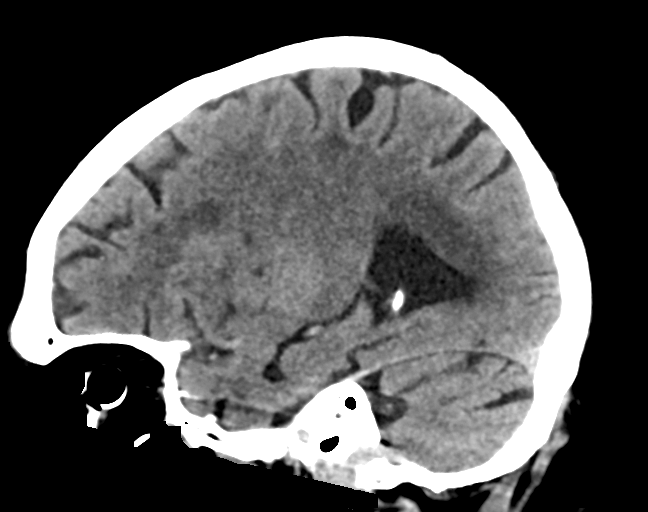

[15 of 47 positions shown; findings below may reference images not displayed]

FINDINGS: CT HEAD FINDINGS

BRAIN:
BRAIN
Cerebral ventricle sizes are concordant with the degree of cerebral
volume loss. Patchy and confluent areas of decreased attenuation are
noted throughout the deep and periventricular white matter of the
cerebral hemispheres bilaterally, compatible with chronic
microvascular ischemic disease.

No evidence of large-territorial acute infarction. No parenchymal
hemorrhage. No mass lesion. No extra-axial collection.

No mass effect or midline shift. No hydrocephalus. Basilar cisterns
are patent.

Vascular: No hyperdense vessel. Atherosclerotic calcifications are
present within the cavernous internal carotid and vertebral
arteries.

Skull: No acute fracture or focal lesion.

Sinuses/Orbits: Paranasal sinuses and mastoid air cells are clear.
Right ocular prosthesis. The left is unremarkable.

Other: None.

CT CERVICAL SPINE FINDINGS

Alignment: Grade 1 anterolisthesis of C4 on C5, C5 on C6, C6 on C7.

Skull base and vertebrae: Multilevel mild degenerative changes of
the spine. No acute fracture. No aggressive appearing focal osseous
lesion or focal pathologic process.

Soft tissues and spinal canal: No prevertebral fluid or swelling. No
visible canal hematoma.

Upper chest: Emphysematous change.

Other: Atherosclerotic plaque.
IMPRESSION: 1.  No acute intracranial abnormality.
2. No acute displaced fracture or traumatic listhesis of the
cervical spine.
3.  Emphysema (16R2O-LSX.3).
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
## 2022-07-31 ENCOUNTER — Other Ambulatory Visit: Payer: Self-pay | Admitting: Podiatry

## 2022-08-02 DIAGNOSIS — D692 Other nonthrombocytopenic purpura: Secondary | ICD-10-CM | POA: Diagnosis not present

## 2022-08-02 DIAGNOSIS — J438 Other emphysema: Secondary | ICD-10-CM | POA: Diagnosis not present

## 2022-08-02 DIAGNOSIS — E785 Hyperlipidemia, unspecified: Secondary | ICD-10-CM | POA: Diagnosis not present

## 2022-08-02 DIAGNOSIS — K219 Gastro-esophageal reflux disease without esophagitis: Secondary | ICD-10-CM | POA: Diagnosis not present

## 2022-08-02 DIAGNOSIS — E118 Type 2 diabetes mellitus with unspecified complications: Secondary | ICD-10-CM | POA: Diagnosis not present

## 2022-08-09 DIAGNOSIS — Z1331 Encounter for screening for depression: Secondary | ICD-10-CM | POA: Diagnosis not present

## 2022-08-09 DIAGNOSIS — Z0000 Encounter for general adult medical examination without abnormal findings: Secondary | ICD-10-CM | POA: Diagnosis not present

## 2022-08-09 DIAGNOSIS — Z23 Encounter for immunization: Secondary | ICD-10-CM | POA: Diagnosis not present

## 2022-08-09 DIAGNOSIS — E1129 Type 2 diabetes mellitus with other diabetic kidney complication: Secondary | ICD-10-CM | POA: Diagnosis not present

## 2022-08-09 DIAGNOSIS — M5136 Other intervertebral disc degeneration, lumbar region: Secondary | ICD-10-CM | POA: Diagnosis not present

## 2022-08-09 DIAGNOSIS — J449 Chronic obstructive pulmonary disease, unspecified: Secondary | ICD-10-CM | POA: Diagnosis not present

## 2022-08-09 DIAGNOSIS — I10 Essential (primary) hypertension: Secondary | ICD-10-CM | POA: Diagnosis not present

## 2022-08-09 DIAGNOSIS — D692 Other nonthrombocytopenic purpura: Secondary | ICD-10-CM | POA: Diagnosis not present

## 2022-08-09 DIAGNOSIS — E1140 Type 2 diabetes mellitus with diabetic neuropathy, unspecified: Secondary | ICD-10-CM | POA: Diagnosis not present

## 2022-08-09 DIAGNOSIS — R251 Tremor, unspecified: Secondary | ICD-10-CM | POA: Diagnosis not present

## 2022-08-09 DIAGNOSIS — K219 Gastro-esophageal reflux disease without esophagitis: Secondary | ICD-10-CM | POA: Diagnosis not present

## 2022-08-19 ENCOUNTER — Other Ambulatory Visit: Payer: Self-pay

## 2022-08-19 ENCOUNTER — Ambulatory Visit: Payer: Medicare HMO

## 2022-08-19 ENCOUNTER — Ambulatory Visit
Admission: EM | Admit: 2022-08-19 | Discharge: 2022-08-19 | Disposition: A | Discharge status: Home / Self Care | Payer: Medicare HMO | Attending: Student | Admitting: Student

## 2022-08-19 DIAGNOSIS — M7582 Other shoulder lesions, left shoulder: Secondary | ICD-10-CM

## 2022-08-19 DIAGNOSIS — M19012 Primary osteoarthritis, left shoulder: Secondary | ICD-10-CM

## 2022-08-19 MED ORDER — NAPROXEN 500 MG PO TABS: 500.0000 mg | ORAL_TABLET | Freq: Two times a day (BID) | ORAL | 0 refills | Status: DC

## 2022-08-19 NOTE — ED Triage Notes (Signed)
Pt is here with left shoulder pain that started today but has a hx of artisitis, pt has taken OTC meds to relieve discomfort.

## 2022-08-19 NOTE — Discharge Instructions (Addendum)
-  Take naproxen twice daily at this time. -Can continue with Tylenol for discomfort as well. -Ice the shoulder for 30 minutes twice daily. -If no improvement contact orthopedics for follow-up appointment.

## 2022-08-22 NOTE — ED Provider Notes (Signed)
[facility]    CSN: [id number] Arrival date & time: 08/19/22  1520      History   Chief Complaint Chief Complaint  Patient presents with   Shoulder Pain    HPI Jeremy Valencia is a 81 y.o. male who presents today for evaluation of 24-48 hours left shoulder pain.  The patient has been experiencing a aching and throbbing discomfort in his left shoulder over the past several days however worsening beginning today.  He denies any trauma or injury affecting the left shoulder.  He is right-hand dominant.  No surgical history to the left upper extremity.  He reports pain along the anterior lateral aspect left shoulder with occasional radiation to the left elbow.  He has noticed some loss of range of motion.  He is a diabetic, he does take [place].  He denies any radiation of pain into the chest.  He denies any vision changes, headaches, chest pain or shortness of breath.  He does have increased pain when attempts to follow left side.  He is currently not taking any medication consistently for the left shoulder at this time.   Shoulder Pain   Past Medical History:  Diagnosis Date   Arthritis    BPH (benign prostatic hyperplasia)    Bursitis 05/16/2013   BURSITIS/CAPSULITIS RT ANKLE   Cancer ([place])    skin cancer   Capsulitis of right foot 01/21/2013   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    Diabetes mellitus without complication ([place])    Diverticulosis    Emphysema, unspecified ([place])    GERD (gastroesophageal reflux disease)    Hyperlipidemia    Hypertension    Plantar fasciitis     Patient Active Problem List   Diagnosis Date Noted   Overweight (BMI 25.0-29.9) 11/16/2021   Glaucoma 11/16/2021   Hyperlipidemia 11/16/2021   Constipation 11/16/2021   General weakness 11/15/2021   Acute encephalopathy 11/15/2021   Other emphysema ([place]) 01/22/2018   Benign essential tremor 01/18/2018   Insomnia, 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/18/2018   Tremor of hands and face 01/18/2018    Fever 10/26/2017   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/26/2017   Persistent proteinuria 01/18/2016   Senile purpura ([place]) 01/18/2016   Type 2 diabetes mellitus ([place]) 06/19/2015   Microscopic hematuria 06/19/2015   Enlarged prostate 06/19/2015   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   Diabetes mellitus, type 2 ([place]) 03/13/2015   DD (diverticular disease) 03/13/2015   Acid reflux 03/13/2015   Chronic LBP 01/08/2015   Essential (primary) hypertension 07/18/2014   Enthesopathy of ankle and tarsus 07/17/2014   Dupuytren's contracture of foot 07/17/2014   Capsulitis of right foot    Plantar fasciitis    Bursitis 05/16/2013    Past Surgical History:  Procedure Laterality Date   APPENDECTOMY     BACK SURGERY     CATARACT EXTRACTION Left    COLONOSCOPY     COLONOSCOPY WITH PROPOFOL N/A 04/06/2021   Procedure: COLONOSCOPY WITH PROPOFOL;  Surgeon: Lesly Rubenstein, MD;  Location: [facility] ENDOSCOPY;  Service: Endoscopy;  Laterality: N/A;  DM   FOOT SURGERY     LAMINECTOMY     right eye     ROTATOR CUFF REPAIR Right    UPPER GI ENDOSCOPY         Home Medications    Prior to Admission medications   Medication Sig Start Date End Date Taking? Authorizing Provider  naproxen (NAPROSYN) 500 MG tablet Take 1 tablet (500 mg total) by mouth  2 (two) times daily. 08/19/22  Yes Lattie Corns, PA-C  albuterol (PROVENTIL HFA;VENTOLIN HFA) 108 (90 Base) MCG/ACT inhaler Inhale 2 puffs into the lungs every 6 (six) hours as needed for wheezing or shortness of breath. 10/28/17   Gladstone Lighter, MD  Ascorbic Acid (VITAMIN C) 1000 MG tablet Take 1,000 mg by mouth daily.    [provider]  aspirin 81 MG tablet Take 81 mg by mouth daily.    [provider]  atorvastatin (LIPITOR) 20 MG tablet Take 20 mg by mouth daily.    [provider]  Cholecalciferol (VITAMIN D3) 1000 units CAPS Take 1,000 Units by mouth daily.     [provider]  co-enzyme  Q-10 50 MG capsule Take 50 mg by mouth as directed.    [provider]  colchicine 0.6 MG tablet Take 0.6 mg by mouth as needed.     [provider]  gabapentin (NEURONTIN) 300 MG capsule TAKE 1 CAPSULE BY MOUTH THREE TIMES DAILY 08/02/22   Hyatt, Max T, DPM  glimepiride (AMARYL) 4 MG tablet Take 4 mg by mouth every morning. 11/03/21   [provider]  hydrALAZINE (APRESOLINE) 25 MG tablet Take 25 mg by mouth 2 (two) times daily. 10/10/21   [provider]  hydrochlorothiazide (HYDRODIURIL) 25 MG tablet Take 25 mg by mouth 2 (two) times daily.    [provider]  JARDIANCE 10 MG TABS tablet Take 10 mg by mouth daily. 10/25/21   [provider]  lisinopril (ZESTRIL) 20 MG tablet Take 20 mg by mouth daily. 10/18/21   [provider]  Multiple Vitamin (MULTI-VITAMINS) TABS Take 1 tablet by mouth daily.     [provider]  Omega-3 Fatty Acids (FISH OIL) 1000 MG CAPS Take 3-4 capsules by mouth daily.     [provider]  polyethylene glycol (MIRALAX / GLYCOLAX) 17 g packet Take 17 g by mouth daily. With a glass of fluid. 11/18/21   Enzo Bi, MD  timolol (TIMOPTIC) 0.5 % ophthalmic solution Place 1 drop into both eyes daily. 10/20/21   [provider]  Zinc Acetate 50 MG CAPS Take 1 capsule by mouth daily.     [provider]    Family History Family History  Problem Relation Age of Onset   Cerebral aneurysm Mother    CAD Father    Parkinson's disease Father    Prostate cancer Father     Social History Social History   Tobacco Use   Smoking status: Former   Smokeless tobacco: Never   Tobacco comments:    1998  Vaping Use   Vaping Use: Never used  Substance Use Topics   Alcohol use: Yes    Comment: rarely   Drug use: No     Allergies   Metformin, Rofecoxib, and Lamisil [terbinafine]   Review of Systems Review of Systems  Musculoskeletal:  Positive for arthralgias and myalgias.  All  other systems reviewed and are negative.    Physical Exam Triage Vital Signs ED Triage Vitals  Enc Vitals Group     BP 08/19/22 1624 (!) 151/71     Pulse Rate 08/19/22 1624 78     Resp 08/19/22 1624 19     Temp 08/19/22 1624 98.4 F (36.9 C)     Temp Source 08/19/22 1624 Oral     SpO2 08/19/22 1624 98 %     Weight --      Height --  Head Circumference --      Peak Flow --      Pain Score 08/19/22 1622 5     Pain Loc --      Pain Edu? --      Excl. in [place]? --    No data found.  Updated Vital Signs BP (!) 151/71 (BP Location: Left Arm)   Pulse 78   Temp 98.4 F (36.9 C) (Oral)   Resp 19   SpO2 98%   Visual Acuity Right Eye Distance:   Left Eye Distance:   Bilateral Distance:    Right Eye Near:   Left Eye Near:    Bilateral Near:     Physical Exam Left Upper Extremity: Examination of the left shoulder and arm showed no bony abnormality or edema.  Patient has intact active range of motion with both forward flexion and abduction with pain at the extremes.  With the left shoulder abduction 90 degrees entire external rotation close to 9 degrees, internal rotation 70 degrees.  He does have pain at the extremes of motion at today's visit.  The patient has a positive Hawkins test and a positive impingement test.  The patient has a negative drop arm test. The patient has a negative yergasons and speeds test.  The patient is non-tender along the deltoid muscle.  There is moderate subacromial space tenderness with mild AC joint tenderness.  The patient has no instability of the shoulder with anterior-posterior motion.  There is a negative sulcus sign.  The rotator cuff muscle strength is 4/5 with supraspinatus, 4/5 with internal rotation, and 4/5 with external rotation.  There is no crepitus with range of motion activities.    Neurological: The patient has sensation that is intact to light touch and pinprick bilaterally.  The patient has normal grip strength.  The patient has  full biceps, wrist extension, grip, and interosseous strength.  The patient has 2 + DTRs bilaterally.  Vascular: The patient has less than 2 second capillary refill.  The patient has normal ulnar and radial pulses.  The patient has normal warmth to touch.    UC Treatments / Results  Labs (all labs ordered are listed, but only abnormal results are displayed) Labs Reviewed - No data to display  EKG   Radiology X-rays of the left shoulder were obtained at today's visit.  These x-rays are negative for any evidence of acute fracture or dislocation.  No focal bone abnormalities identified.  Procedures Procedures (including critical care time)  Medications Ordered in UC Medications - No data to display  Initial Impression / Assessment and Plan / UC Course  I have reviewed the triage vital signs and the nursing notes.  Pertinent labs & imaging results that were available during my care of the patient were reviewed by me and considered in my medical decision making (see chart for details).     1.  Treatment options were discussed today with the patient. 2.  The patient does have increased pain with range of motion activities however no weakness or acute abnormalities identified. 3.  No red flag symptoms are noted at today's appointment. 4.  The patient was given a prescription of naproxen to begin taking twice daily at this time, he may continue to supplement with Tylenol as well. 5.  If no improvement over the next 1-2 weeks he was instructed to contact orthopedics for follow-up appointment. Final Clinical Impressions(s) / UC Diagnoses   Final diagnoses:  Arthritis of left acromioclavicular joint  Rotator  cuff tendonitis, left     Discharge Instructions      -Take naproxen twice daily at this time. -Can continue with Tylenol for discomfort as well. -Ice the shoulder for 30 minutes twice daily. -If no improvement contact orthopedics for follow-up appointment.    ED  Prescriptions     Medication Sig Dispense Auth. Provider   naproxen (NAPROSYN) 500 MG tablet Take 1 tablet (500 mg total) by mouth 2 (two) times daily. 30 tablet Lattie Corns, [place]      PDMP not reviewed this encounter.   Antonyo, [place], PA-C 08/22/22 1718

## 2022-10-20 DIAGNOSIS — D225 Melanocytic nevi of trunk: Secondary | ICD-10-CM | POA: Diagnosis not present

## 2022-10-20 DIAGNOSIS — Z85828 Personal history of other malignant neoplasm of skin: Secondary | ICD-10-CM | POA: Diagnosis not present

## 2022-10-20 DIAGNOSIS — D2272 Melanocytic nevi of left lower limb, including hip: Secondary | ICD-10-CM | POA: Diagnosis not present

## 2022-10-20 DIAGNOSIS — Z85820 Personal history of malignant melanoma of skin: Secondary | ICD-10-CM | POA: Diagnosis not present

## 2022-10-20 DIAGNOSIS — D2261 Melanocytic nevi of right upper limb, including shoulder: Secondary | ICD-10-CM | POA: Diagnosis not present

## 2022-10-20 DIAGNOSIS — L570 Actinic keratosis: Secondary | ICD-10-CM | POA: Diagnosis not present

## 2022-10-20 DIAGNOSIS — Z86006 Personal history of melanoma in-situ: Secondary | ICD-10-CM | POA: Diagnosis not present

## 2022-10-20 DIAGNOSIS — D2262 Melanocytic nevi of left upper limb, including shoulder: Secondary | ICD-10-CM | POA: Diagnosis not present

## 2022-10-20 DIAGNOSIS — X32XXXA Exposure to sunlight, initial encounter: Secondary | ICD-10-CM | POA: Diagnosis not present

## 2022-10-27 ENCOUNTER — Other Ambulatory Visit: Payer: Self-pay | Admitting: Podiatry

## 2022-10-28 DIAGNOSIS — Q111 Other anophthalmos: Secondary | ICD-10-CM | POA: Diagnosis not present

## 2022-10-28 DIAGNOSIS — H401121 Primary open-angle glaucoma, left eye, mild stage: Secondary | ICD-10-CM | POA: Diagnosis not present

## 2022-10-28 DIAGNOSIS — E113292 Type 2 diabetes mellitus with mild nonproliferative diabetic retinopathy without macular edema, left eye: Secondary | ICD-10-CM | POA: Diagnosis not present

## 2022-11-02 ENCOUNTER — Ambulatory Visit: Payer: Medicare HMO | Admitting: Podiatry

## 2022-11-02 DIAGNOSIS — D2371 Other benign neoplasm of skin of right lower limb, including hip: Secondary | ICD-10-CM

## 2022-11-02 DIAGNOSIS — M7751 Other enthesopathy of right foot: Secondary | ICD-10-CM

## 2022-11-02 MED ORDER — TRIAMCINOLONE ACETONIDE 40 MG/ML IJ SUSP
20.0000 mg | Freq: Once | INTRAMUSCULAR | Status: AC
  Administered 2022-11-02: 20 mg

## 2022-11-02 NOTE — Progress Notes (Signed)
He presents today chief complaint of painful area beneath the first metatarsal of the right foot.  Denies any trauma.  States that his right foot right and here as he points to the subtalar joint has been painful.  Objective: Vital signs stable alert oriented x 3 pulses are palpable bilateral.  He has rigid arthritic changes to the subtalar joint of the right foot with pes planus.  He also has pain on palpation of the sinus tarsi and attempted range of motion of that joint.  Benign skin lesion plantar aspect subfirst metatarsophalangeal joint noncomplicated right foot.  Assessment: Pain limb secondary to benign skin lesion right foot.  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  Plan: I injected subtalar joint today 20 mg Kenalog 5 mg Marcaine point maximal tenderness.  Debrided benign skin lesion.  Follow-up with him in 6 months if necessary.

## 2022-12-26 DIAGNOSIS — Z4421 Encounter for fitting and adjustment of artificial right eye: Secondary | ICD-10-CM | POA: Diagnosis not present

## 2023-01-21 ENCOUNTER — Other Ambulatory Visit: Payer: Self-pay | Admitting: Podiatry

## 2023-01-27 ENCOUNTER — Other Ambulatory Visit: Payer: Self-pay | Admitting: Podiatry

## 2023-02-02 DIAGNOSIS — E1342 Other specified diabetes mellitus with diabetic polyneuropathy: Secondary | ICD-10-CM | POA: Diagnosis not present

## 2023-02-02 DIAGNOSIS — E785 Hyperlipidemia, unspecified: Secondary | ICD-10-CM | POA: Diagnosis not present

## 2023-02-02 DIAGNOSIS — K219 Gastro-esophageal reflux disease without esophagitis: Secondary | ICD-10-CM | POA: Diagnosis not present

## 2023-02-02 DIAGNOSIS — D692 Other nonthrombocytopenic purpura: Secondary | ICD-10-CM | POA: Diagnosis not present

## 2023-02-09 DIAGNOSIS — E1129 Type 2 diabetes mellitus with other diabetic kidney complication: Secondary | ICD-10-CM | POA: Diagnosis not present

## 2023-02-09 DIAGNOSIS — E1140 Type 2 diabetes mellitus with diabetic neuropathy, unspecified: Secondary | ICD-10-CM | POA: Diagnosis not present

## 2023-02-09 DIAGNOSIS — J449 Chronic obstructive pulmonary disease, unspecified: Secondary | ICD-10-CM | POA: Diagnosis not present

## 2023-02-09 DIAGNOSIS — E785 Hyperlipidemia, unspecified: Secondary | ICD-10-CM | POA: Diagnosis not present

## 2023-02-09 DIAGNOSIS — I10 Essential (primary) hypertension: Secondary | ICD-10-CM | POA: Diagnosis not present

## 2023-02-09 DIAGNOSIS — R251 Tremor, unspecified: Secondary | ICD-10-CM | POA: Diagnosis not present

## 2023-02-09 DIAGNOSIS — D692 Other nonthrombocytopenic purpura: Secondary | ICD-10-CM | POA: Diagnosis not present

## 2023-02-09 DIAGNOSIS — R809 Proteinuria, unspecified: Secondary | ICD-10-CM | POA: Diagnosis not present

## 2023-02-09 DIAGNOSIS — Z87891 Personal history of nicotine dependence: Secondary | ICD-10-CM | POA: Diagnosis not present

## 2023-04-28 DIAGNOSIS — H401121 Primary open-angle glaucoma, left eye, mild stage: Secondary | ICD-10-CM | POA: Diagnosis not present

## 2023-05-03 ENCOUNTER — Ambulatory Visit: Payer: Medicare HMO | Admitting: Podiatry

## 2023-05-03 DIAGNOSIS — M79676 Pain in unspecified toe(s): Secondary | ICD-10-CM | POA: Diagnosis not present

## 2023-05-03 DIAGNOSIS — B353 Tinea pedis: Secondary | ICD-10-CM

## 2023-05-03 DIAGNOSIS — B351 Tinea unguium: Secondary | ICD-10-CM | POA: Diagnosis not present

## 2023-05-03 DIAGNOSIS — D2371 Other benign neoplasm of skin of right lower limb, including hip: Secondary | ICD-10-CM | POA: Diagnosis not present

## 2023-05-03 DIAGNOSIS — M7751 Other enthesopathy of right foot: Secondary | ICD-10-CM

## 2023-05-03 MED ORDER — KETOCONAZOLE 2 % EX CREA
TOPICAL_CREAM | Freq: Two times a day (BID) | CUTANEOUS | Status: DC
Start: 2023-05-03 — End: 2023-08-11

## 2023-05-03 MED ORDER — TRIAMCINOLONE ACETONIDE 40 MG/ML IJ SUSP
20.0000 mg | Freq: Once | INTRAMUSCULAR | Status: AC
Start: 2023-05-03 — End: 2023-05-03
  Administered 2023-05-03: 20 mg

## 2023-05-03 NOTE — Addendum Note (Signed)
Addended by: Lottie Rater E on: 05/03/2023 01:06 PM   Modules accepted: Orders

## 2023-05-03 NOTE — Progress Notes (Signed)
He presents today for a chief complaint of pain to his right ankle as he points to the subtalar joint of the right foot.  He states that he is got thick callus underneath here as he points beneath the first metatarsal head of the right foot.  He is also concerned about a rash on the plantar aspect of the forefoot and his toenails.  Objective: Vital signs stable alert oriented x 3.  Pulses are palpable.  Definitely has pain on palpation of the sinus tarsi and attempted range of motion of the subtalar joint of the right foot which does demonstrate more pes planovalgus positioning than the left.  His toenails bilaterally are thick elongated dystrophic.  He does have a small patch of tinea pedis which appears to be vesicular nature to the plantar aspect of the forefoot between the base of the second toe and the first metatarsal phalangeal joint plantarly.  He also has a benign skin lesion subfirst metatarsal phalangeal joint which appears to be a tyloma.  Assessment: Pain in limb secondary to onychomycosis subtalar joint capsulitis sinus tarsitis benign skin lesion and tinea pedis.  Plan: Discussed etiology pathology and surgical therapies started him on ketoconazole twice daily.  We also injected the right foot today 20 mg Kenalog 5 mg Marcaine after sterile manner and skin prep into the sinus tarsi right foot.  Debrided the nails for him as well and debrided benign skin lesion.  Follow-up with me as needed or in 6 months for diabetic check

## 2023-05-09 DIAGNOSIS — H401121 Primary open-angle glaucoma, left eye, mild stage: Secondary | ICD-10-CM | POA: Diagnosis not present

## 2023-05-09 DIAGNOSIS — E113292 Type 2 diabetes mellitus with mild nonproliferative diabetic retinopathy without macular edema, left eye: Secondary | ICD-10-CM | POA: Diagnosis not present

## 2023-05-09 DIAGNOSIS — Q111 Other anophthalmos: Secondary | ICD-10-CM | POA: Diagnosis not present

## 2023-05-09 DIAGNOSIS — H43812 Vitreous degeneration, left eye: Secondary | ICD-10-CM | POA: Diagnosis not present

## 2023-05-22 DIAGNOSIS — Z85820 Personal history of malignant melanoma of skin: Secondary | ICD-10-CM | POA: Diagnosis not present

## 2023-05-22 DIAGNOSIS — D2261 Melanocytic nevi of right upper limb, including shoulder: Secondary | ICD-10-CM | POA: Diagnosis not present

## 2023-05-22 DIAGNOSIS — Z85828 Personal history of other malignant neoplasm of skin: Secondary | ICD-10-CM | POA: Diagnosis not present

## 2023-05-22 DIAGNOSIS — D2272 Melanocytic nevi of left lower limb, including hip: Secondary | ICD-10-CM | POA: Diagnosis not present

## 2023-05-22 DIAGNOSIS — D2262 Melanocytic nevi of left upper limb, including shoulder: Secondary | ICD-10-CM | POA: Diagnosis not present

## 2023-05-22 DIAGNOSIS — D2271 Melanocytic nevi of right lower limb, including hip: Secondary | ICD-10-CM | POA: Diagnosis not present

## 2023-05-22 DIAGNOSIS — L570 Actinic keratosis: Secondary | ICD-10-CM | POA: Diagnosis not present

## 2023-07-17 ENCOUNTER — Other Ambulatory Visit: Payer: Self-pay | Admitting: Podiatry

## 2023-08-01 ENCOUNTER — Inpatient Hospital Stay
Admission: EM | Admit: 2023-08-01 | Discharge: 2023-08-11 | DRG: 871 | Disposition: A | Discharge status: Rehabilitation Facility | Payer: Medicare HMO | Attending: Internal Medicine | Admitting: Internal Medicine

## 2023-08-01 ENCOUNTER — Emergency Department: Payer: Medicare HMO

## 2023-08-01 DIAGNOSIS — M1811 Unilateral primary osteoarthritis of first carpometacarpal joint, right hand: Secondary | ICD-10-CM | POA: Diagnosis not present

## 2023-08-01 DIAGNOSIS — R55 Syncope and collapse: Secondary | ICD-10-CM | POA: Diagnosis not present

## 2023-08-01 DIAGNOSIS — Z7984 Long term (current) use of oral hypoglycemic drugs: Secondary | ICD-10-CM | POA: Diagnosis not present

## 2023-08-01 DIAGNOSIS — Z87891 Personal history of nicotine dependence: Secondary | ICD-10-CM

## 2023-08-01 DIAGNOSIS — Z85828 Personal history of other malignant neoplasm of skin: Secondary | ICD-10-CM | POA: Diagnosis not present

## 2023-08-01 DIAGNOSIS — E871 Hypo-osmolality and hyponatremia: Secondary | ICD-10-CM | POA: Diagnosis present

## 2023-08-01 DIAGNOSIS — K5730 Diverticulosis of large intestine without perforation or abscess without bleeding: Secondary | ICD-10-CM | POA: Diagnosis not present

## 2023-08-01 DIAGNOSIS — I6310 Cerebral infarction due to embolism of unspecified precerebral artery: Secondary | ICD-10-CM

## 2023-08-01 DIAGNOSIS — Z8249 Family history of ischemic heart disease and other diseases of the circulatory system: Secondary | ICD-10-CM

## 2023-08-01 DIAGNOSIS — J439 Emphysema, unspecified: Secondary | ICD-10-CM | POA: Diagnosis present

## 2023-08-01 DIAGNOSIS — N400 Enlarged prostate without lower urinary tract symptoms: Secondary | ICD-10-CM | POA: Diagnosis present

## 2023-08-01 DIAGNOSIS — E785 Hyperlipidemia, unspecified: Secondary | ICD-10-CM | POA: Diagnosis present

## 2023-08-01 DIAGNOSIS — Z820 Family history of epilepsy and other diseases of the nervous system: Secondary | ICD-10-CM | POA: Diagnosis not present

## 2023-08-01 DIAGNOSIS — I671 Cerebral aneurysm, nonruptured: Secondary | ICD-10-CM | POA: Diagnosis not present

## 2023-08-01 DIAGNOSIS — G9341 Metabolic encephalopathy: Secondary | ICD-10-CM | POA: Diagnosis not present

## 2023-08-01 DIAGNOSIS — R6520 Severe sepsis without septic shock: Secondary | ICD-10-CM | POA: Diagnosis present

## 2023-08-01 DIAGNOSIS — E1151 Type 2 diabetes mellitus with diabetic peripheral angiopathy without gangrene: Secondary | ICD-10-CM | POA: Diagnosis present

## 2023-08-01 DIAGNOSIS — E872 Acidosis, unspecified: Secondary | ICD-10-CM | POA: Diagnosis present

## 2023-08-01 DIAGNOSIS — Z7982 Long term (current) use of aspirin: Secondary | ICD-10-CM

## 2023-08-01 DIAGNOSIS — I10 Essential (primary) hypertension: Secondary | ICD-10-CM | POA: Diagnosis present

## 2023-08-01 DIAGNOSIS — R54 Age-related physical debility: Secondary | ICD-10-CM | POA: Diagnosis present

## 2023-08-01 DIAGNOSIS — R29818 Other symptoms and signs involving the nervous system: Secondary | ICD-10-CM | POA: Diagnosis not present

## 2023-08-01 DIAGNOSIS — I6523 Occlusion and stenosis of bilateral carotid arteries: Secondary | ICD-10-CM | POA: Diagnosis not present

## 2023-08-01 DIAGNOSIS — I63412 Cerebral infarction due to embolism of left middle cerebral artery: Secondary | ICD-10-CM | POA: Diagnosis present

## 2023-08-01 DIAGNOSIS — R600 Localized edema: Secondary | ICD-10-CM | POA: Diagnosis not present

## 2023-08-01 DIAGNOSIS — E1165 Type 2 diabetes mellitus with hyperglycemia: Secondary | ICD-10-CM | POA: Diagnosis present

## 2023-08-01 DIAGNOSIS — R410 Disorientation, unspecified: Secondary | ICD-10-CM | POA: Diagnosis not present

## 2023-08-01 DIAGNOSIS — Z79899 Other long term (current) drug therapy: Secondary | ICD-10-CM | POA: Diagnosis not present

## 2023-08-01 DIAGNOSIS — R4701 Aphasia: Secondary | ICD-10-CM | POA: Diagnosis present

## 2023-08-01 DIAGNOSIS — Z1152 Encounter for screening for COVID-19: Secondary | ICD-10-CM

## 2023-08-01 DIAGNOSIS — I672 Cerebral atherosclerosis: Secondary | ICD-10-CM | POA: Diagnosis not present

## 2023-08-01 DIAGNOSIS — E119 Type 2 diabetes mellitus without complications: Secondary | ICD-10-CM

## 2023-08-01 DIAGNOSIS — G9340 Encephalopathy, unspecified: Secondary | ICD-10-CM | POA: Diagnosis not present

## 2023-08-01 DIAGNOSIS — R9082 White matter disease, unspecified: Secondary | ICD-10-CM | POA: Diagnosis not present

## 2023-08-01 DIAGNOSIS — G8191 Hemiplegia, unspecified affecting right dominant side: Secondary | ICD-10-CM | POA: Diagnosis present

## 2023-08-01 DIAGNOSIS — I330 Acute and subacute infective endocarditis: Secondary | ICD-10-CM | POA: Diagnosis present

## 2023-08-01 DIAGNOSIS — Z0389 Encounter for observation for other suspected diseases and conditions ruled out: Secondary | ICD-10-CM | POA: Diagnosis not present

## 2023-08-01 DIAGNOSIS — K219 Gastro-esophageal reflux disease without esophagitis: Secondary | ICD-10-CM | POA: Diagnosis present

## 2023-08-01 DIAGNOSIS — R000 Tachycardia, unspecified: Secondary | ICD-10-CM | POA: Diagnosis not present

## 2023-08-01 DIAGNOSIS — A419 Sepsis, unspecified organism: Secondary | ICD-10-CM | POA: Diagnosis present

## 2023-08-01 DIAGNOSIS — I38 Endocarditis, valve unspecified: Secondary | ICD-10-CM

## 2023-08-01 DIAGNOSIS — I700 Atherosclerosis of aorta: Secondary | ICD-10-CM | POA: Diagnosis present

## 2023-08-01 DIAGNOSIS — M25531 Pain in right wrist: Secondary | ICD-10-CM | POA: Diagnosis not present

## 2023-08-01 DIAGNOSIS — R4182 Altered mental status, unspecified: Principal | ICD-10-CM

## 2023-08-01 DIAGNOSIS — R4702 Dysphasia: Secondary | ICD-10-CM | POA: Diagnosis not present

## 2023-08-01 DIAGNOSIS — I6389 Other cerebral infarction: Secondary | ICD-10-CM | POA: Diagnosis not present

## 2023-08-01 DIAGNOSIS — R569 Unspecified convulsions: Secondary | ICD-10-CM | POA: Diagnosis not present

## 2023-08-01 DIAGNOSIS — I340 Nonrheumatic mitral (valve) insufficiency: Secondary | ICD-10-CM | POA: Diagnosis not present

## 2023-08-01 DIAGNOSIS — R0689 Other abnormalities of breathing: Secondary | ICD-10-CM | POA: Diagnosis not present

## 2023-08-01 DIAGNOSIS — J449 Chronic obstructive pulmonary disease, unspecified: Secondary | ICD-10-CM | POA: Insufficient documentation

## 2023-08-01 DIAGNOSIS — I63419 Cerebral infarction due to embolism of unspecified middle cerebral artery: Secondary | ICD-10-CM | POA: Diagnosis not present

## 2023-08-01 DIAGNOSIS — Z8042 Family history of malignant neoplasm of prostate: Secondary | ICD-10-CM

## 2023-08-01 DIAGNOSIS — K4020 Bilateral inguinal hernia, without obstruction or gangrene, not specified as recurrent: Secondary | ICD-10-CM | POA: Diagnosis not present

## 2023-08-01 DIAGNOSIS — I639 Cerebral infarction, unspecified: Secondary | ICD-10-CM | POA: Diagnosis not present

## 2023-08-01 DIAGNOSIS — I63233 Cerebral infarction due to unspecified occlusion or stenosis of bilateral carotid arteries: Secondary | ICD-10-CM | POA: Diagnosis not present

## 2023-08-01 DIAGNOSIS — I361 Nonrheumatic tricuspid (valve) insufficiency: Secondary | ICD-10-CM | POA: Diagnosis not present

## 2023-08-01 LAB — URINALYSIS, W/ REFLEX TO CULTURE (INFECTION SUSPECTED)
Bacteria, UA: NONE SEEN
Bilirubin Urine: NEGATIVE
Glucose, UA: >=500 mg/dL — AB
Hgb urine dipstick: NEGATIVE
Ketones, ur: NEGATIVE mg/dL
Leukocytes,Ua: NEGATIVE
Nitrite: NEGATIVE
Protein, ur: NEGATIVE mg/dL
Specific Gravity, Urine: 1.026 (ref 1.005–1.030)
Squamous Epithelial / HPF: 0 /[HPF] (ref 0–5)
pH: 5 (ref 5.0–8.0)

## 2023-08-01 LAB — CBC WITH DIFFERENTIAL/PLATELET
Abs Immature Granulocytes: 0.07 10*3/uL (ref 0.00–0.07)
Basophils Absolute: 0 10*3/uL (ref 0.0–0.1)
Basophils Relative: 0 %
Eosinophils Absolute: 0.1 10*3/uL (ref 0.0–0.5)
Eosinophils Relative: 1 %
HCT: 41.8 % (ref 39.0–52.0)
Hemoglobin: 14.2 g/dL (ref 13.0–17.0)
Immature Granulocytes: 1 %
Lymphocytes Relative: 7 %
Lymphs Abs: 1 10*3/uL (ref 0.7–4.0)
MCH: 33.5 pg (ref 26.0–34.0)
MCHC: 34 g/dL (ref 30.0–36.0)
MCV: 98.6 fL (ref 80.0–100.0)
Monocytes Absolute: 1.9 10*3/uL — ABNORMAL HIGH (ref 0.1–1.0)
Monocytes Relative: 14 %
Neutro Abs: 10.7 10*3/uL — ABNORMAL HIGH (ref 1.7–7.7)
Neutrophils Relative %: 77 %
Platelets: 195 10*3/uL (ref 150–400)
RBC: 4.24 MIL/uL (ref 4.22–5.81)
RDW: 11.9 % (ref 11.5–15.5)
WBC: 13.7 10*3/uL — ABNORMAL HIGH (ref 4.0–10.5)
nRBC: 0 % (ref 0.0–0.2)

## 2023-08-01 LAB — RESP PANEL BY RT-PCR (RSV, FLU A&B, COVID)  RVPGX2
Influenza A by PCR: NEGATIVE
Influenza B by PCR: NEGATIVE
Resp Syncytial Virus by PCR: NEGATIVE
SARS Coronavirus 2 by RT PCR: NEGATIVE

## 2023-08-01 LAB — COMPREHENSIVE METABOLIC PANEL
ALT: 15 U/L (ref 0–44)
AST: 14 U/L — ABNORMAL LOW (ref 15–41)
Albumin: 3.1 g/dL — ABNORMAL LOW (ref 3.5–5.0)
Alkaline Phosphatase: 42 U/L (ref 38–126)
Anion gap: 7 (ref 5–15)
BUN: 27 mg/dL — ABNORMAL HIGH (ref 8–23)
CO2: 22 mmol/L (ref 22–32)
Calcium: 8 mg/dL — ABNORMAL LOW (ref 8.9–10.3)
Chloride: 103 mmol/L (ref 98–111)
Creatinine, Ser: 1.05 mg/dL (ref 0.61–1.24)
GFR, Estimated: >60 mL/min (ref >60)
Glucose, Bld: 142 mg/dL — ABNORMAL HIGH (ref 70–99)
Potassium: 3.8 mmol/L (ref 3.5–5.1)
Sodium: 132 mmol/L — ABNORMAL LOW (ref 135–145)
Total Bilirubin: 0.9 mg/dL (ref <1.2)
Total Protein: 6.2 g/dL — ABNORMAL LOW (ref 6.5–8.1)

## 2023-08-01 LAB — APTT: aPTT: 35 s (ref 24–36)

## 2023-08-01 LAB — PROTIME-INR
INR: 1.2 (ref 0.8–1.2)
Prothrombin Time: 15.1 s (ref 11.4–15.2)

## 2023-08-01 LAB — LACTIC ACID, PLASMA: Lactic Acid, Venous: 2.4 mmol/L (ref 0.5–1.9)

## 2023-08-01 MED ORDER — LACTATED RINGERS IV BOLUS (SEPSIS)
500.0000 mL | Freq: Once | INTRAVENOUS | Status: AC
  Administered 2023-08-01: 500 mL via INTRAVENOUS

## 2023-08-01 MED ORDER — CEFTRIAXONE SODIUM 2 G IJ SOLR
2.0000 g | INTRAMUSCULAR | Status: DC
  Administered 2023-08-01: 2 g via INTRAVENOUS
  Filled 2023-08-01: qty 20

## 2023-08-01 MED ORDER — LACTATED RINGERS IV SOLN: INTRAVENOUS | Status: DC

## 2023-08-01 NOTE — Progress Notes (Signed)
CODE SEPSIS - PHARMACY COMMUNICATION  **Broad Spectrum Antibiotics should be administered within 1 hour of Sepsis diagnosis**  Time Code Sepsis Called/Page Received: 2210  Antibiotics Ordered: Ceftriaxone  Time of 1st antibiotic administration: 2238  Otelia Sergeant, PharmD, [facility] 08/01/2023 10:11 PM

## 2023-08-01 NOTE — Sepsis Progress Note (Signed)
Elink monitoring for the code sepsis protocol.  

## 2023-08-01 NOTE — ED Provider Notes (Signed)
[facility] Provider Note   Event Date/Time   First MD Initiated Contact with Patient 08/01/23 2206     (approximate) History  No chief complaint on file.  HPI Jeremy [place] is a 82 y.o. male with a past medical history of type 2 diabetes, hypertension, hyperlipidemia, emphysema, and diverticulosis who presents for altered mental status via EMS.  Patient's family at bedside and gives the history as patient is not responding to questions at this time.  Patient's son at bedside states that he has had similar symptoms in the past when he has had urinary tract infections including decreased responsiveness. ROS: Unable to assess   Physical Exam  Triage Vital Signs: ED Triage Vitals  Encounter Vitals Group     BP      Systolic BP Percentile      Diastolic BP Percentile      Pulse      Resp      Temp      Temp src      SpO2      Weight      Height      Head Circumference      Peak Flow      Pain Score      Pain Loc      Pain Education      Exclude from Growth Chart    Most recent vital signs: There were no vitals filed for this visit. General: Awake, GCS 12 CV:  Good peripheral perfusion.  Resp:  Normal effort.  Abd:  No distention.  Other:  Elderly overweight Caucasian male resting comfortably in no acute distress ED Results / Procedures / Treatments  Labs (all labs ordered are listed, but only abnormal results are displayed) Labs Reviewed - No data to display EKG ED ECG REPORT I, Merwyn Katos, the attending physician, personally viewed and interpreted this ECG. Date: 08/01/2023 EKG Time: 2208 Rate: 96 Rhythm: normal sinus rhythm QRS Axis: normal Intervals: normal ST/T Wave abnormalities: normal Narrative Interpretation: no evidence of acute ischemia RADIOLOGY ED MD interpretation: Pending -Agree with radiology assessment Official radiology report(s): No results found. PROCEDURES: Critical Care performed: No Procedures MEDICATIONS  ORDERED IN ED: Medications - No data to display IMPRESSION / MDM / ASSESSMENT AND PLAN / ED COURSE  I reviewed the triage vital signs and the nursing notes.                             The patient is on the cardiac monitor to evaluate for evidence of arrhythmia and/or significant heart rate changes. Patient's presentation is most consistent with acute presentation with potential threat to life or bodily function. Patient presents for altered mental status of unknown origin  Will obtain medical workup and discuss with social work to try to obtain collateral information.  Given History, Physical, and Workup there is no overt concern for a dangerous emergent cause such as, but not limited to, CNS infection, severe Toxidrome, severe metabolic derangement, or stroke.  Disposition: Care of this patient will be signed out to the oncoming physician at the end of my shift.  All pertinent patient information conveyed and all questions answered.  All further care and disposition decisions will be made by the oncoming physician.   FINAL CLINICAL IMPRESSION(S) / ED DIAGNOSES   Final diagnoses:  None   Rx / DC Orders   ED Discharge Orders     None  Note:  This document was prepared using Dragon voice recognition software and may include unintentional dictation errors.   Merwyn Katos, MD 08/07/23 2115

## 2023-08-02 ENCOUNTER — Encounter: Payer: Self-pay | Admitting: Internal Medicine

## 2023-08-02 ENCOUNTER — Inpatient Hospital Stay: Payer: Medicare HMO

## 2023-08-02 DIAGNOSIS — R9082 White matter disease, unspecified: Secondary | ICD-10-CM | POA: Diagnosis not present

## 2023-08-02 DIAGNOSIS — R410 Disorientation, unspecified: Secondary | ICD-10-CM | POA: Diagnosis not present

## 2023-08-02 DIAGNOSIS — R600 Localized edema: Secondary | ICD-10-CM | POA: Diagnosis not present

## 2023-08-02 DIAGNOSIS — I671 Cerebral aneurysm, nonruptured: Secondary | ICD-10-CM | POA: Diagnosis not present

## 2023-08-02 DIAGNOSIS — G4700 Insomnia, unspecified: Secondary | ICD-10-CM | POA: Diagnosis not present

## 2023-08-02 DIAGNOSIS — I361 Nonrheumatic tricuspid (valve) insufficiency: Secondary | ICD-10-CM | POA: Diagnosis not present

## 2023-08-02 DIAGNOSIS — Z7984 Long term (current) use of oral hypoglycemic drugs: Secondary | ICD-10-CM | POA: Diagnosis not present

## 2023-08-02 DIAGNOSIS — E871 Hypo-osmolality and hyponatremia: Secondary | ICD-10-CM | POA: Diagnosis present

## 2023-08-02 DIAGNOSIS — R6520 Severe sepsis without septic shock: Secondary | ICD-10-CM | POA: Diagnosis present

## 2023-08-02 DIAGNOSIS — I63419 Cerebral infarction due to embolism of unspecified middle cerebral artery: Secondary | ICD-10-CM | POA: Diagnosis not present

## 2023-08-02 DIAGNOSIS — Z794 Long term (current) use of insulin: Secondary | ICD-10-CM | POA: Diagnosis not present

## 2023-08-02 DIAGNOSIS — I6310 Cerebral infarction due to embolism of unspecified precerebral artery: Secondary | ICD-10-CM | POA: Diagnosis not present

## 2023-08-02 DIAGNOSIS — A419 Sepsis, unspecified organism: Secondary | ICD-10-CM

## 2023-08-02 DIAGNOSIS — E785 Hyperlipidemia, unspecified: Secondary | ICD-10-CM | POA: Diagnosis present

## 2023-08-02 DIAGNOSIS — K5730 Diverticulosis of large intestine without perforation or abscess without bleeding: Secondary | ICD-10-CM | POA: Diagnosis not present

## 2023-08-02 DIAGNOSIS — Z820 Family history of epilepsy and other diseases of the nervous system: Secondary | ICD-10-CM | POA: Diagnosis not present

## 2023-08-02 DIAGNOSIS — R4182 Altered mental status, unspecified: Secondary | ICD-10-CM | POA: Diagnosis not present

## 2023-08-02 DIAGNOSIS — R21 Rash and other nonspecific skin eruption: Secondary | ICD-10-CM | POA: Diagnosis not present

## 2023-08-02 DIAGNOSIS — Z87891 Personal history of nicotine dependence: Secondary | ICD-10-CM | POA: Diagnosis not present

## 2023-08-02 DIAGNOSIS — J439 Emphysema, unspecified: Secondary | ICD-10-CM | POA: Diagnosis present

## 2023-08-02 DIAGNOSIS — I700 Atherosclerosis of aorta: Secondary | ICD-10-CM | POA: Diagnosis present

## 2023-08-02 DIAGNOSIS — I6523 Occlusion and stenosis of bilateral carotid arteries: Secondary | ICD-10-CM | POA: Diagnosis not present

## 2023-08-02 DIAGNOSIS — I38 Endocarditis, valve unspecified: Secondary | ICD-10-CM | POA: Diagnosis not present

## 2023-08-02 DIAGNOSIS — G8191 Hemiplegia, unspecified affecting right dominant side: Secondary | ICD-10-CM | POA: Diagnosis present

## 2023-08-02 DIAGNOSIS — I63412 Cerebral infarction due to embolism of left middle cerebral artery: Secondary | ICD-10-CM | POA: Diagnosis present

## 2023-08-02 DIAGNOSIS — I6389 Other cerebral infarction: Secondary | ICD-10-CM | POA: Diagnosis not present

## 2023-08-02 DIAGNOSIS — R4702 Dysphasia: Secondary | ICD-10-CM | POA: Diagnosis not present

## 2023-08-02 DIAGNOSIS — G9341 Metabolic encephalopathy: Secondary | ICD-10-CM | POA: Diagnosis present

## 2023-08-02 DIAGNOSIS — I6381 Other cerebral infarction due to occlusion or stenosis of small artery: Secondary | ICD-10-CM | POA: Diagnosis not present

## 2023-08-02 DIAGNOSIS — I63312 Cerebral infarction due to thrombosis of left middle cerebral artery: Secondary | ICD-10-CM | POA: Diagnosis not present

## 2023-08-02 DIAGNOSIS — I672 Cerebral atherosclerosis: Secondary | ICD-10-CM | POA: Diagnosis not present

## 2023-08-02 DIAGNOSIS — I639 Cerebral infarction, unspecified: Secondary | ICD-10-CM | POA: Diagnosis not present

## 2023-08-02 DIAGNOSIS — K4020 Bilateral inguinal hernia, without obstruction or gangrene, not specified as recurrent: Secondary | ICD-10-CM | POA: Diagnosis not present

## 2023-08-02 DIAGNOSIS — G9340 Encephalopathy, unspecified: Secondary | ICD-10-CM | POA: Diagnosis not present

## 2023-08-02 DIAGNOSIS — M25531 Pain in right wrist: Secondary | ICD-10-CM | POA: Diagnosis not present

## 2023-08-02 DIAGNOSIS — I63233 Cerebral infarction due to unspecified occlusion or stenosis of bilateral carotid arteries: Secondary | ICD-10-CM | POA: Diagnosis not present

## 2023-08-02 DIAGNOSIS — J449 Chronic obstructive pulmonary disease, unspecified: Secondary | ICD-10-CM | POA: Insufficient documentation

## 2023-08-02 DIAGNOSIS — E1169 Type 2 diabetes mellitus with other specified complication: Secondary | ICD-10-CM | POA: Diagnosis not present

## 2023-08-02 DIAGNOSIS — R4701 Aphasia: Secondary | ICD-10-CM | POA: Diagnosis present

## 2023-08-02 DIAGNOSIS — E1151 Type 2 diabetes mellitus with diabetic peripheral angiopathy without gangrene: Secondary | ICD-10-CM | POA: Diagnosis present

## 2023-08-02 DIAGNOSIS — M1811 Unilateral primary osteoarthritis of first carpometacarpal joint, right hand: Secondary | ICD-10-CM | POA: Diagnosis not present

## 2023-08-02 DIAGNOSIS — Z85828 Personal history of other malignant neoplasm of skin: Secondary | ICD-10-CM | POA: Diagnosis not present

## 2023-08-02 DIAGNOSIS — Z79899 Other long term (current) drug therapy: Secondary | ICD-10-CM | POA: Diagnosis not present

## 2023-08-02 DIAGNOSIS — E872 Acidosis, unspecified: Secondary | ICD-10-CM | POA: Diagnosis present

## 2023-08-02 DIAGNOSIS — Z8249 Family history of ischemic heart disease and other diseases of the circulatory system: Secondary | ICD-10-CM | POA: Diagnosis not present

## 2023-08-02 DIAGNOSIS — R29818 Other symptoms and signs involving the nervous system: Secondary | ICD-10-CM | POA: Diagnosis not present

## 2023-08-02 DIAGNOSIS — K219 Gastro-esophageal reflux disease without esophagitis: Secondary | ICD-10-CM | POA: Diagnosis present

## 2023-08-02 DIAGNOSIS — I330 Acute and subacute infective endocarditis: Secondary | ICD-10-CM | POA: Diagnosis present

## 2023-08-02 DIAGNOSIS — Z1152 Encounter for screening for COVID-19: Secondary | ICD-10-CM | POA: Diagnosis not present

## 2023-08-02 DIAGNOSIS — N400 Enlarged prostate without lower urinary tract symptoms: Secondary | ICD-10-CM | POA: Diagnosis present

## 2023-08-02 DIAGNOSIS — E1165 Type 2 diabetes mellitus with hyperglycemia: Secondary | ICD-10-CM | POA: Diagnosis present

## 2023-08-02 DIAGNOSIS — I340 Nonrheumatic mitral (valve) insufficiency: Secondary | ICD-10-CM | POA: Diagnosis not present

## 2023-08-02 DIAGNOSIS — I10 Essential (primary) hypertension: Secondary | ICD-10-CM | POA: Diagnosis present

## 2023-08-02 LAB — CBG MONITORING, ED
Glucose-Capillary: 57 mg/dL — ABNORMAL LOW (ref 70–99)
Glucose-Capillary: 110 mg/dL — ABNORMAL HIGH (ref 70–99)

## 2023-08-02 LAB — HEMOGLOBIN A1C
Hgb A1c MFr Bld: 7.3 % — ABNORMAL HIGH (ref 4.8–5.6)
Mean Plasma Glucose: 162.81 mg/dL

## 2023-08-02 LAB — CBC
HCT: 43.3 % (ref 39.0–52.0)
Hemoglobin: 14.9 g/dL (ref 13.0–17.0)
MCH: 32.8 pg (ref 26.0–34.0)
MCHC: 34.4 g/dL (ref 30.0–36.0)
MCV: 95.4 fL (ref 80.0–100.0)
Platelets: 206 10*3/uL (ref 150–400)
RBC: 4.54 MIL/uL (ref 4.22–5.81)
RDW: 11.8 % (ref 11.5–15.5)
WBC: 13.8 10*3/uL — ABNORMAL HIGH (ref 4.0–10.5)
nRBC: 0 % (ref 0.0–0.2)

## 2023-08-02 LAB — COMPREHENSIVE METABOLIC PANEL
ALT: 18 U/L (ref 0–44)
AST: 17 U/L (ref 15–41)
Albumin: 3.4 g/dL — ABNORMAL LOW (ref 3.5–5.0)
Alkaline Phosphatase: 43 U/L (ref 38–126)
Anion gap: 7 (ref 5–15)
BUN: 26 mg/dL — ABNORMAL HIGH (ref 8–23)
CO2: 24 mmol/L (ref 22–32)
Calcium: 8.2 mg/dL — ABNORMAL LOW (ref 8.9–10.3)
Chloride: 103 mmol/L (ref 98–111)
Creatinine, Ser: 1.04 mg/dL (ref 0.61–1.24)
GFR, Estimated: >60 mL/min (ref >60)
Glucose, Bld: 166 mg/dL — ABNORMAL HIGH (ref 70–99)
Potassium: 3.8 mmol/L (ref 3.5–5.1)
Sodium: 134 mmol/L — ABNORMAL LOW (ref 135–145)
Total Bilirubin: 1.4 mg/dL — ABNORMAL HIGH (ref <1.2)
Total Protein: 6.7 g/dL (ref 6.5–8.1)

## 2023-08-02 LAB — LACTIC ACID, PLASMA: Lactic Acid, Venous: 0.7 mmol/L (ref 0.5–1.9)

## 2023-08-02 LAB — PROCALCITONIN: Procalcitonin: <0.1 ng/mL

## 2023-08-02 LAB — BRAIN NATRIURETIC PEPTIDE: B Natriuretic Peptide: 113.4 pg/mL — ABNORMAL HIGH (ref 0.0–100.0)

## 2023-08-02 MED ORDER — SODIUM CHLORIDE 0.9 % IV SOLN
2.0000 g | Freq: Two times a day (BID) | INTRAVENOUS | Status: DC
  Administered 2023-08-02 – 2023-08-03 (×3): 2 g via INTRAVENOUS
  Filled 2023-08-02 (×4): qty 12.5

## 2023-08-02 MED ORDER — ONDANSETRON HCL 4 MG PO TABS: 4.0000 mg | ORAL_TABLET | Freq: Four times a day (QID) | ORAL | Status: DC | PRN

## 2023-08-02 MED ORDER — ONDANSETRON HCL 4 MG/2ML IJ SOLN: 4.0000 mg | Freq: Four times a day (QID) | INTRAMUSCULAR | Status: DC | PRN

## 2023-08-02 MED ORDER — VANCOMYCIN HCL IN DEXTROSE 1-5 GM/200ML-% IV SOLN
1000.0000 mg | Freq: Once | INTRAVENOUS | Status: AC
  Administered 2023-08-02: 1000 mg via INTRAVENOUS
  Filled 2023-08-02: qty 200

## 2023-08-02 MED ORDER — ACETAMINOPHEN 325 MG PO TABS
650.0000 mg | ORAL_TABLET | Freq: Four times a day (QID) | ORAL | Status: DC | PRN
  Administered 2023-08-03 – 2023-08-05 (×6): 650 mg via ORAL
  Filled 2023-08-02 (×7): qty 2

## 2023-08-02 MED ORDER — ACETAMINOPHEN 650 MG RE SUPP: 650.0000 mg | Freq: Four times a day (QID) | RECTAL | Status: DC | PRN

## 2023-08-02 MED ORDER — VANCOMYCIN HCL 1750 MG/350ML IV SOLN
1750.0000 mg | INTRAVENOUS | Status: DC
  Administered 2023-08-02 – 2023-08-03 (×2): 1750 mg via INTRAVENOUS
  Filled 2023-08-02 (×2): qty 350

## 2023-08-02 MED ORDER — INSULIN ASPART 100 UNIT/ML IJ SOLN
0.0000 [IU] | INTRAMUSCULAR | Status: DC
  Administered 2023-08-04: 1 [IU] via SUBCUTANEOUS
  Administered 2023-08-05: 3 [IU] via SUBCUTANEOUS
  Administered 2023-08-05: 1 [IU] via SUBCUTANEOUS
  Administered 2023-08-05: 3 [IU] via SUBCUTANEOUS
  Administered 2023-08-05: 1 [IU] via SUBCUTANEOUS
  Administered 2023-08-06: 2 [IU] via SUBCUTANEOUS
  Administered 2023-08-06: 4 [IU] via SUBCUTANEOUS
  Administered 2023-08-06: 2 [IU] via SUBCUTANEOUS
  Administered 2023-08-07 (×2): 1 [IU] via SUBCUTANEOUS
  Administered 2023-08-07: 3 [IU] via SUBCUTANEOUS
  Administered 2023-08-07: 1 [IU] via SUBCUTANEOUS
  Administered 2023-08-07: 4 [IU] via SUBCUTANEOUS
  Administered 2023-08-07: 3 [IU] via SUBCUTANEOUS
  Administered 2023-08-07: 1 [IU] via SUBCUTANEOUS
  Administered 2023-08-08: 3 [IU] via SUBCUTANEOUS
  Administered 2023-08-08: 1 [IU] via SUBCUTANEOUS
  Administered 2023-08-08: 4 [IU] via SUBCUTANEOUS
  Administered 2023-08-09 (×3): 1 [IU] via SUBCUTANEOUS
  Filled 2023-08-02 (×21): qty 1

## 2023-08-02 MED ORDER — METRONIDAZOLE 500 MG/100ML IV SOLN
500.0000 mg | Freq: Once | INTRAVENOUS | Status: AC
  Administered 2023-08-02: 500 mg via INTRAVENOUS
  Filled 2023-08-02: qty 100

## 2023-08-02 MED ORDER — LACTATED RINGERS IV SOLN: INTRAVENOUS | Status: AC

## 2023-08-02 MED ORDER — LACTATED RINGERS IV SOLN: 150.0000 mL/h | INTRAVENOUS | Status: DC

## 2023-08-02 MED ORDER — ALBUTEROL SULFATE (2.5 MG/3ML) 0.083% IN NEBU: 2.5000 mg | INHALATION_SOLUTION | Freq: Four times a day (QID) | RESPIRATORY_TRACT | Status: DC | PRN

## 2023-08-02 MED ORDER — ENOXAPARIN SODIUM 40 MG/0.4ML IJ SOSY
40.0000 mg | PREFILLED_SYRINGE | INTRAMUSCULAR | Status: DC
  Administered 2023-08-02: 40 mg via SUBCUTANEOUS
  Filled 2023-08-02: qty 0.4

## 2023-08-02 MED ORDER — ALBUTEROL SULFATE HFA 108 (90 BASE) MCG/ACT IN AERS: 2.0000 | INHALATION_SPRAY | Freq: Four times a day (QID) | RESPIRATORY_TRACT | Status: DC | PRN

## 2023-08-02 MED ORDER — ENOXAPARIN SODIUM 40 MG/0.4ML IJ SOSY
40.0000 mg | PREFILLED_SYRINGE | INTRAMUSCULAR | Status: DC
  Administered 2023-08-02 – 2023-08-10 (×9): 40 mg via SUBCUTANEOUS
  Filled 2023-08-02 (×9): qty 0.4

## 2023-08-02 MED ORDER — METRONIDAZOLE 500 MG/100ML IV SOLN
500.0000 mg | Freq: Two times a day (BID) | INTRAVENOUS | Status: DC
Start: 2023-08-02 — End: 2023-08-03
  Administered 2023-08-02 – 2023-08-03 (×3): 500 mg via INTRAVENOUS
  Filled 2023-08-02 (×3): qty 100

## 2023-08-02 NOTE — Progress Notes (Signed)
Pharmacy Antibiotic Note  Jeremy Valencia is a 82 y.o. male admitted on 08/01/2023 with infection from unknown source.  Pharmacy has been consulted for Cefepime & Vancomycin dosing for 7 days.  Plan: Cefepime 2 gm q12hr per indication & renal fxn.  Pt given Vancomycin 1000 mg once. Vancomycin 1750 mg IV Q 24 hrs. Goal AUC 400-550. Expected AUC: 482.1 SCr used: 1.05  Pharmacy will continue to follow and will adjust abx dosing whenever warranted.  Temp (24hrs), Avg:99.5 F (37.5 C), Min:99.5 F (37.5 C), Max:99.5 F (37.5 C)   Recent Labs  Lab 08/01/23 2209  WBC 13.7*  CREATININE 1.05  LATICACIDVEN 2.4*    CrCl cannot be calculated (Unknown ideal weight.).    Allergies  Allergen Reactions   Metformin Diarrhea   Rofecoxib Other (See Comments)    Other reaction(s): Unknown   Lamisil [Terbinafine] Rash    Antimicrobials this admission: 11/26 Ceftriaxone >> x 1 dose 11/27 Cefepime >> x 7 days 11/27 Vancomycin >> x 7 days 11/27 Flagyl >> x 7 days  Microbiology results: 11/26 BCx: Pending  Thank you for allowing pharmacy to be a part of this patient's care.  Otelia Sergeant, PharmD, [facility] 08/02/2023 1:56 AM

## 2023-08-02 NOTE — Assessment & Plan Note (Addendum)
Secondary to sepsis Neurologic checks Fall and aspiration precautions Will get CT head

## 2023-08-02 NOTE — Evaluation (Signed)
Physical Therapy Evaluation Patient Details Name: Jeremy Valencia MRN: [id number] DOB: 09-Oct-1940 Today's Date: 08/02/2023  History of Present Illness  Pt is an 82 year old male admitted with sepsis, AMS    PMH significant for COPD with emphysema, diabetes mellitus and hypertension being admitted with sepsis  Clinical Impression  Patient admitted with the above. 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 Currently, patient is oriented to self and follows one step commands inconsistently with multimodal step by step cueing. Noted significant R UE weakness with inability to move against gravity/without. Son and wife present and state this is far from patient's baseline. Required modA+2 for bed mobility and minA+2 to stand from stretcher and take sidesteps at EOB with forward lean. Cues for upright posture with intermittent follow through. Patient will benefit from skilled PT services during acute stay to address listed deficits. Patient will benefit from ongoing therapy at discharge to maximize functional independence and safety.         If plan is discharge home, recommend the following: A lot of help with walking and/or transfers;A lot of help with bathing/dressing/bathroom;Assistance with cooking/housework;Direct supervision/assist for financial management;Direct supervision/assist for medications management;Assist for transportation;Help with stairs or ramp for entrance;Supervision due to cognitive status   Can travel by private vehicle        Equipment Recommendations Other (comment) (TBD)  Recommendations for Other Services  Rehab consult    Functional Status Assessment Patient has had a recent decline in their functional status and demonstrates the ability to make significant improvements in function in a reasonable and predictable amount of time.     Precautions / Restrictions Precautions Precautions: Fall      Mobility  Bed Mobility Overal  bed mobility: Needs Assistance Bed Mobility: Supine to Sit, Sit to Supine     Supine to sit: Mod assist, +2 for physical assistance, +2 for safety/equipment Sit to supine: Mod assist, +2 for physical assistance, +2 for safety/equipment        Transfers Overall transfer level: Needs assistance Equipment used: 2 person hand held assist Transfers: Sit to/from Stand Sit to Stand: Min assist, +2 physical assistance, +2 safety/equipment           General transfer comment: lateral steps up the bed with MIN A +2    Ambulation/Gait                  Stairs            Wheelchair Mobility     Tilt Bed    Modified Rankin (Stroke Patients Only)       Balance Overall balance assessment: Needs assistance Sitting-balance support: Single extremity supported, Feet supported Sitting balance-Leahy Scale: Fair Sitting balance - Comments: slight posterior lean with feet unsupported   Standing balance support: Bilateral upper extremity supported Standing balance-Leahy Scale: Poor                               Pertinent Vitals/Pain Pain Assessment Pain Assessment: Faces Faces Pain Scale: No hurt    Home Living Family/patient expects to be discharged to:: Private residence Living Arrangements: Spouse/significant other Available Help at Discharge: Family Type of Home: House Home Access: Stairs to enter Entrance Stairs-Rails: None Entrance Stairs-Number of Steps: 1-2   Home Layout: One level;Laundry or work area in [employer] Equipment: Agricultural consultant (2 wheels)      Prior Function Prior Level  of Function : Independent/Modified Independent;Driving             Mobility Comments: amb with no AD ADLs Comments: MOD I-I in ADL/IADL, 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     Extremity/Trunk Assessment   Upper Extremity Assessment Upper Extremity Assessment: Defer to OT evaluation (R arm weakness with minimal activation) RUE Deficits /  Details: PROM: grossly WFL; limited initiation of AROM; attempts to shrug shoulders/ push/pull away but minimal movement; will continue to assess; Pt unable to move/hold up against gravity; Formal sensation assessment limited by cognition; will continue to assess RUE Coordination: decreased fine motor;decreased gross motor LUE Deficits / Details: PROM: grossly WFL; formal AROM assessment limited due to cognition however able to bring hand to mouth, hold shoulder up at 90 degrees against gravity LUE Coordination: decreased fine motor    Lower Extremity Assessment Lower Extremity Assessment: Generalized weakness (able to perform B SLR against gravity)    Cervical / Trunk Assessment Cervical / Trunk Assessment: Kyphotic  Communication   Communication Communication: Difficulty following commands/understanding Following commands: Follows one step commands inconsistently Cueing Techniques: Verbal cues;Gestural cues;Tactile cues;Visual cues  Cognition Arousal: Lethargic Behavior During Therapy: Flat affect Overall Cognitive Status: Impaired/Different from baseline Area of Impairment: Orientation, Attention, Memory, Following commands, Safety/judgement, Awareness                 Orientation Level: Disoriented to, Place, Time, Situation Current Attention Level: Focused Memory: Decreased short-term memory Following Commands: Follows one step commands inconsistently (with step by step multi modal cues) Safety/Judgement: Decreased awareness of safety, Decreased awareness of deficits Awareness: Intellectual            General Comments General comments (skin integrity, edema, etc.): vss throughout    Exercises     Assessment/Plan    PT Assessment Patient needs continued PT services  PT Problem List Decreased strength;Decreased activity tolerance;Decreased balance;Decreased mobility;Decreased cognition;Decreased knowledge of use of DME;Decreased safety awareness;Decreased knowledge  of precautions       PT Treatment Interventions DME instruction;Gait training;Functional mobility training;Therapeutic activities;Therapeutic exercise;Balance training;Stair training;Patient/family education    PT Goals (Current goals can be found in the Care Plan section)  Acute Rehab PT Goals Patient Stated Goal: did not state PT Goal Formulation: With family Time For Goal Achievement: 08/16/23 Potential to Achieve Goals: Fair    Frequency Min 1X/week     Co-evaluation PT/OT/SLP Co-Evaluation/Treatment: Yes Reason for Co-Treatment: To address functional/ADL transfers;Necessary to address cognition/behavior during functional activity;For patient/therapist safety;Complexity of the patient's impairments (multi-system involvement) PT goals addressed during session: Mobility/safety with mobility;Balance OT goals addressed during session: ADL's and self-care       AM-PAC PT "6 Clicks" Mobility  Outcome Measure Help needed turning from your back to your side while in a flat bed without using bedrails?: A Lot Help needed moving from lying on your back to sitting on the side of a flat bed without using bedrails?: A Lot Help needed moving to and from a bed to a chair (including a wheelchair)?: Total Help needed standing up from a chair using your arms (e.g., wheelchair or bedside chair)?: Total Help needed to walk in hospital room?: Total Help needed climbing 3-5 steps with a railing? : Total 6 Click Score: 8    End of Session   Activity Tolerance: Patient limited by fatigue Patient left: in bed;with call bell/phone within reach;with family/visitor present Nurse Communication: Mobility status PT Visit Diagnosis: Unsteadiness on feet (R26.81);Muscle weakness (generalized) (M62.81);Difficulty in walking, not elsewhere  classified (R26.2);Other abnormalities of gait and mobility (R26.89)    Time: 1345-1411 PT Time Calculation (min) (ACUTE ONLY): 26 min   Charges:   PT  Evaluation $PT Eval Moderate Complexity: 1 Mod PT Treatments $Therapeutic Activity: 8-22 mins PT General Charges $$ ACUTE PT VISIT: 1 Visit         Maylon Peppers, PT, DPT Physical Therapist - [facility]  [facility]   Amiliana Foutz A Teira Arcilla 08/02/2023, 3:05 PM

## 2023-08-02 NOTE — Evaluation (Signed)
Occupational Therapy Evaluation Patient Details Name: Jeremy Valencia MRN: [id number] DOB: 1941-03-30 Today's Date: 08/02/2023   History of Present Illness Pt is an 82 year old male admitted with sepsis, AMS    PMH significant for COPD with emphysema, diabetes mellitus and hypertension being admitted with sepsis   Clinical Impression   Chart reviewed, pt greeted in bed, oriented to self. Poor one step direction following without multi modal cues. Family in room reports pt is indep in ADL/IADL PTA, amb with no AD. Pt presents with deficits in RUE function, strength, endurance, activity tolerance, balance, cognition affecting safe and optimal ADL completion. Pt is unable to hold R arm against gravity, minimal initiation noted at shoulder shrug/pushing/pulling; weak grip strength. Of note, pt is able to move LUE, BLE against gravity. MOD A +2 required for bed mobility, STS with MIN A +2, steps up the bed with MIN A +2. LB dressing completed with MAX A, anticipate all additional ADLs will require MAX A at this time due to current level of cognition. Pt will benefit from actue OT to address deficits and to facilitate return to PLOF. Pt is left in bed, RUE supported, family present. Nurse/MD notified of findings.     If plan is discharge home, recommend the following: A lot of help with walking and/or transfers;A lot of help with bathing/dressing/bathroom;Assistance with cooking/housework;Direct supervision/assist for medications management;Assist for transportation;Supervision due to cognitive status;Help with stairs or ramp for entrance;Direct supervision/assist for financial management;Assistance with feeding    Functional Status Assessment  Patient has had a recent decline in their functional status and demonstrates the ability to make significant improvements in function in a reasonable and predictable amount of time.  Equipment Recommendations  Other (comment) (per next venue of care)     Recommendations for Other Services       Precautions / Restrictions Precautions Precautions: Fall      Mobility Bed Mobility Overal bed mobility: Needs Assistance Bed Mobility: Supine to Sit, Sit to Supine     Supine to sit: Mod assist, +2 for physical assistance, +2 for safety/equipment Sit to supine: Mod assist, +2 for physical assistance, +2 for safety/equipment        Transfers Overall transfer level: Needs assistance Equipment used: 2 person hand held assist Transfers: Sit to/from Stand Sit to Stand: +2 physical assistance, +2 safety/equipment, Min assist (ED stretcher)           General transfer comment: lateral steps up the bed with MIN A +2      Balance Overall balance assessment: Needs assistance Sitting-balance support: Feet supported Sitting balance-Leahy Scale: Fair     Standing balance support: Bilateral upper extremity supported, During functional activity Standing balance-Leahy Scale: Fair                             ADL either performed or assessed with clinical judgement   ADL Overall ADL's : Needs assistance/impaired         Upper Body Bathing: Maximal assistance Upper Body Bathing Details (indicate cue type and reason): anticipate Lower Body Bathing: Maximal assistance Lower Body Bathing Details (indicate cue type and reason): anticipate Upper Body Dressing : Maximal assistance Upper Body Dressing Details (indicate cue type and reason): anticipated Lower Body Dressing: Maximal assistance Lower Body Dressing Details (indicate cue type and reason): socks     Toileting- Clothing Manipulation and Hygiene: Maximal assistance         General ADL  Comments: performance limited by cognition on this date     Vision   Additional Comments: Pt is tracking throughout room L eye, has R prosthetic eye; will continue to assess     Perception     Perception-Other Comments: will continue to assess   Praxis     Praxis-Other  Comments: will continue to assess   Pertinent Vitals/Pain Pain Assessment Pain Assessment: Faces Faces Pain Scale: No hurt     Extremity/Trunk Assessment Upper Extremity Assessment Upper Extremity Assessment: Defer to OT evaluation (R arm weakness with minimal activation) RUE Deficits / Details: PROM: grossly WFL; limited initiation of AROM; attempts to shrug shoulders/ push/pull away but minimal movement; will continue to assess; Pt unable to move/hold up against gravity; Formal sensation assessment limited by cognition; will continue to assess RUE Coordination: decreased fine motor;decreased gross motor LUE Deficits / Details: PROM: grossly WFL; formal AROM assessment limited due to cognition however able to bring hand to mouth, hold shoulder up at 90 degrees against gravity LUE Coordination: decreased fine motor   Lower Extremity Assessment Lower Extremity Assessment: Generalized weakness (able to perform B SLR against gravity)   Cervical / Trunk Assessment Cervical / Trunk Assessment: Kyphotic   Communication Communication Communication: Difficulty following commands/understanding Following commands: Follows one step commands inconsistently Cueing Techniques: Verbal cues;Gestural cues;Tactile cues;Visual cues   Cognition Arousal: Lethargic Behavior During Therapy: Flat affect Overall Cognitive Status: Impaired/Different from baseline Area of Impairment: Orientation, Attention, Memory, Following commands, Safety/judgement, Awareness                 Orientation Level: Disoriented to, Place, Time, Situation Current Attention Level: Focused Memory: Decreased short-term memory Following Commands: Follows one step commands inconsistently (with step by step multi modal cues) Safety/Judgement: Decreased awareness of safety, Decreased awareness of deficits Awareness: Intellectual         General Comments  vss throughout    Exercises Other Exercises Other Exercises: edu  pt and family re: role of OT, role of rehab   Shoulder Instructions      Home Living Family/patient expects to be discharged to:: Private residence Living Arrangements: Spouse/significant other Available Help at Discharge: Family Type of Home: House Home Access: Stairs to enter Secretary/administrator of Steps: 1-2 Entrance Stairs-Rails: None Home Layout: One level;Laundry or work area in [employer] Equipment: Agricultural consultant (2 wheels)          Prior Functioning/Environment Prior Level of Function : Independent/Modified Independent;Driving             Mobility Comments: amb with no AD ADLs Comments: MOD I-I in ADL/IADL, 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        OT Problem List: Decreased strength;Impaired balance (sitting and/or standing);Decreased cognition;Decreased knowledge of precautions;Decreased range of motion;Decreased safety awareness;Decreased activity tolerance;Decreased coordination;Decreased knowledge of use of DME or AE;Impaired UE functional use      OT Treatment/Interventions: Self-care/ADL training;DME and/or AE instruction;Therapeutic activities;Balance training;Therapeutic exercise;Neuromuscular education;Energy conservation;Patient/family education    OT Goals(Current goals can be found in the care plan section) Acute Rehab OT Goals Patient Stated Goal: get stronger OT Goal Formulation: With patient/family Time For Goal Achievement: 08/16/23 Potential to Achieve Goals: Good ADL Goals Pt Will Perform Grooming: with modified independence;sitting Pt Will Perform Lower Body Dressing: with modified independence;sit to/from stand Pt Will Transfer to Toilet: with modified independence;ambulating Pt Will Perform Toileting - Clothing Manipulation and hygiene: with modified  independence;sit to/from stand Pt/caregiver will Perform Home Exercise Program: Increased ROM;Right Upper extremity;With written HEP provided  OT  Frequency: Min 1X/week    Co-evaluation PT/OT/SLP Co-Evaluation/Treatment: Yes Reason for Co-Treatment: To address functional/ADL transfers;Necessary to address cognition/behavior during functional activity;For patient/therapist safety;Complexity of the patient's impairments (multi-system involvement) PT goals addressed during session: Mobility/safety with mobility;Balance OT goals addressed during session: ADL's and self-care      AM-PAC OT "6 Clicks" Daily Activity     Outcome Measure Help from another person eating meals?: A Lot Help from another person taking care of personal grooming?: A Lot Help from another person toileting, which includes using toliet, bedpan, or urinal?: Total Help from another person bathing (including washing, rinsing, drying)?: A Lot Help from another person to put on and taking off regular upper body clothing?: A Lot Help from another person to put on and taking off regular lower body clothing?: A Lot 6 Click Score: 11   End of Session Nurse Communication: Mobility status;Other (comment) (MD and nurse re: acute R arm weakness)  Activity Tolerance: Patient tolerated treatment well Patient left: in bed;with call bell/phone within reach;with family/visitor present  OT Visit Diagnosis: Other abnormalities of gait and mobility (R26.89);Muscle weakness (generalized) (M62.81);Hemiplegia and hemiparesis Hemiplegia - Right/Left: Right Hemiplegia - dominant/non-dominant: Dominant                Time: 1345-1411 OT Time Calculation (min): 26 min Charges:  OT General Charges $OT Visit: 1 Visit OT Evaluation $OT Eval Moderate Complexity: 1 Mod  Oleta Mouse, OTD OTR/L  08/02/23, 3:08 PM

## 2023-08-02 NOTE — Assessment & Plan Note (Addendum)
Sepsis criteria include fever, tachycardia, leukocytosis with lactic acidosis Urinalysis and chest x-ray unrevealing Will get CT abdomen and pelvis given the patient was dry heaving Continue broad-spectrum antibiotics with with cefepime vancomycin and Flagyl IV fluids Follow blood cultures Will keep n.p.o. tonight

## 2023-08-02 NOTE — H&P (Signed)
History and Physical    Patient: Jeremy Valencia:[id number] DOB: [birth year] DOA: 08/01/2023 DOS: the patient was seen and examined on 08/02/2023 PCP: Lynnea Ferrier, MD  Patient coming from: Home  Chief Complaint:  Chief Complaint  Patient presents with   Altered Mental Status    From home, Family states had sudden change of mentation. Does have a history of strong UTIs.     HPI: Jeremy Valencia is a 82 y.o. male with medical history significant for COPD with emphysema, diabetes mellitus and hypertension being admitted with sepsis of unknown source after presenting with lethargy, chills, nausea and confusion.  Most of the history is given by the wife at the bedside who stated that patient was in his usual state of health earlier in the day and they ran a lot of errands however on the arrival home at 6:30 PM, he started feeling cold and went to lie down.  He remained in bed which is unusual for him and at about 9:30 PM she heard him retching and when she went to turn to him he was confused so she decided to get him to the hospital.   Wife states he has had UTIs in the past and this is how he presents.Of note patient was hospitalized in 2023 as well as 2019 with sepsis of unknown source presenting with fever and leukocytosis.  ED course and data review:BP 156/82, temp 99.9, pulse 99 and O2 sat 96% on room air. Labs notable for WBC 13,700 with lactic acid 2.4 Respiratory viral panel negative for COVID, flu and RSV CMP notable for mild hyponatremia of 132 Urinalysis sterile EKG, personally viewed and interpreted, showing sinus at 96 with borderline ST depression in lateral leads Chest x-ray nonacute Patient started on IV fluids, Rocephin and Flagyl and vancomycin for sepsis of unknown source Hospitalist consulted for admission.     Past Medical History:  Diagnosis Date   Arthritis    BPH (benign prostatic hyperplasia)    Bursitis 05/16/2013   BURSITIS/CAPSULITIS RT ANKLE   Cancer  (HCC)    skin cancer   Capsulitis of right foot 01/21/2013   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    Diabetes mellitus without complication (HCC)    Diverticulosis    Emphysema, unspecified (HCC)    GERD (gastroesophageal reflux disease)    Hyperlipidemia    Hypertension    Plantar fasciitis    Past Surgical History:  Procedure Laterality Date   APPENDECTOMY     BACK SURGERY     CATARACT EXTRACTION Left    COLONOSCOPY     COLONOSCOPY WITH PROPOFOL N/A 04/06/2021   Procedure: COLONOSCOPY WITH PROPOFOL;  Surgeon: Regis Bill, MD;  Location: [facility] ENDOSCOPY;  Service: Endoscopy;  Laterality: N/A;  DM   FOOT SURGERY     LAMINECTOMY     right eye     ROTATOR CUFF REPAIR Right    UPPER GI ENDOSCOPY     Social History:  reports that he has quit smoking. He has never used smokeless tobacco. He reports current alcohol use. He reports that he does not use drugs.  Allergies  Allergen Reactions   Metformin Diarrhea   Rofecoxib Other (See Comments)    Other reaction(s): Unknown   Lamisil [Terbinafine] Rash    Family History  Problem Relation Age of Onset   Cerebral aneurysm Mother    CAD Father    Parkinson's disease Father    Prostate cancer Father  Prior to Admission medications   Medication Sig Start Date End Date Taking? Authorizing Provider  albuterol (PROVENTIL HFA;VENTOLIN HFA) 108 (90 Base) MCG/ACT inhaler Inhale 2 puffs into the lungs every 6 (six) hours as needed for wheezing or shortness of breath. 10/28/17   Enid Baas, MD  Ascorbic Acid (VITAMIN C) 1000 MG tablet Take 1,000 mg by mouth daily.    [provider]  aspirin 81 MG tablet Take 81 mg by mouth daily.    [provider]  atorvastatin (LIPITOR) 20 MG tablet Take 20 mg by mouth daily.    [provider]  Cholecalciferol (VITAMIN D3) 1000 units CAPS Take 1,000 Units by mouth daily.     [provider]  co-enzyme Q-10 50 MG capsule Take 50 mg by mouth as  directed.    [provider]  colchicine 0.6 MG tablet Take 0.6 mg by mouth as needed.     [provider]  gabapentin (NEURONTIN) 300 MG capsule TAKE 1 CAPSULE BY MOUTH THREE TIMES DAILY 07/17/23   Standiford, Jenelle Mages, DPM  glimepiride (AMARYL) 4 MG tablet Take 4 mg by mouth every morning. 11/03/21   [provider]  hydrALAZINE (APRESOLINE) 25 MG tablet Take 25 mg by mouth 2 (two) times daily. 10/10/21   [provider]  hydrochlorothiazide (HYDRODIURIL) 25 MG tablet Take 25 mg by mouth 2 (two) times daily.    [provider]  JARDIANCE 10 MG TABS tablet Take 10 mg by mouth daily. 10/25/21   [provider]  lisinopril (ZESTRIL) 20 MG tablet Take 20 mg by mouth daily. 10/18/21   [provider]  Multiple Vitamin (MULTI-VITAMINS) TABS Take 1 tablet by mouth daily.     [provider]  naproxen (NAPROSYN) 500 MG tablet Take 1 tablet (500 mg total) by mouth 2 (two) times daily. 08/19/22   Anson Oregon, PA-C  Omega-3 Fatty Acids (FISH OIL) 1000 MG CAPS Take 3-4 capsules by mouth daily.     [provider]  polyethylene glycol (MIRALAX / GLYCOLAX) 17 g packet Take 17 g by mouth daily. With a glass of fluid. 11/18/21   Darlin Priestly, MD  timolol (TIMOPTIC) 0.5 % ophthalmic solution Place 1 drop into both eyes daily. 10/20/21   [provider]  Zinc Acetate 50 MG CAPS Take 1 capsule by mouth daily.     [provider]    Physical Exam: Vitals:   08/01/23 2206 08/01/23 2207  BP:  (!) 156/82  Pulse: 99   Resp: 18   Temp:  99.5 F (37.5 C)  TempSrc:  Oral  SpO2: 96%    Physical Exam Vitals and nursing note reviewed.  Constitutional:      General: He is not in acute distress.    Comments: Frail-appearing male, lethargic  HENT:     Head: Normocephalic and atraumatic.  Cardiovascular:     Rate and Rhythm: Regular rhythm. Tachycardia present.     Heart sounds: Normal heart sounds.  Pulmonary:      Effort: Pulmonary effort is normal.     Breath sounds: Normal breath sounds.  Abdominal:     Palpations: Abdomen is soft.     Tenderness: There is no abdominal tenderness.  Neurological:     General: No focal deficit present.     Labs on Admission: I have personally reviewed following labs and imaging studies  CBC: Recent Labs  Lab 08/01/23 2209  WBC 13.7*  NEUTROABS 10.7*  HGB 14.2  HCT  41.8  MCV 98.6  PLT 195   Basic Metabolic Panel: Recent Labs  Lab 08/01/23 2209  NA 132*  K 3.8  CL 103  CO2 22  GLUCOSE 142*  BUN 27*  CREATININE 1.05  CALCIUM 8.0*   GFR: CrCl cannot be calculated (Unknown ideal weight.). Liver Function Tests: Recent Labs  Lab 08/01/23 2209  AST 14*  ALT 15  ALKPHOS 42  BILITOT 0.9  PROT 6.2*  ALBUMIN 3.1*   No results for input(s): "LIPASE", "AMYLASE" in the last 168 hours. No results for input(s): "AMMONIA" in the last 168 hours. Coagulation Profile: Recent Labs  Lab 08/01/23 2209  INR 1.2   Cardiac Enzymes: No results for input(s): "CKTOTAL", "CKMB", "CKMBINDEX", "TROPONINI" in the last 168 hours. BNP (last 3 results) No results for input(s): "PROBNP" in the last 8760 hours. HbA1C: No results for input(s): "HGBA1C" in the last 72 hours. CBG: No results for input(s): "GLUCAP" in the last 168 hours. Lipid Profile: No results for input(s): "CHOL", "HDL", "LDLCALC", "TRIG", "CHOLHDL", "LDLDIRECT" in the last 72 hours. Thyroid Function Tests: No results for input(s): "TSH", "T4TOTAL", "FREET4", "T3FREE", "THYROIDAB" in the last 72 hours. Anemia Panel: No results for input(s): "VITAMINB12", "FOLATE", "FERRITIN", "TIBC", "IRON", "RETICCTPCT" in the last 72 hours. Urine analysis:    Component Value Date/Time   COLORURINE YELLOW (A) 08/01/2023 2204   APPEARANCEUR CLEAR (A) 08/01/2023 2204   APPEARANCEUR Clear 06/19/2015 1443   LABSPEC 1.026 08/01/2023 2204   PHURINE 5.0 08/01/2023 2204   GLUCOSEU >=500 (A) 08/01/2023 2204    HGBUR NEGATIVE 08/01/2023 2204   BILIRUBINUR NEGATIVE 08/01/2023 2204   BILIRUBINUR Negative 06/19/2015 1443   KETONESUR NEGATIVE 08/01/2023 2204   PROTEINUR NEGATIVE 08/01/2023 2204   NITRITE NEGATIVE 08/01/2023 2204   LEUKOCYTESUR NEGATIVE 08/01/2023 2204    Radiological Exams on Admission: DG Chest Port 1 View  Result Date: 08/01/2023 CLINICAL DATA:  Questionable sepsis EXAM: PORTABLE CHEST 1 VIEW COMPARISON:  Chest x-ray 11/15/2021 FINDINGS: Small metallic BBs overlie the left chest and central chest, unchanged from prior. The heart size and mediastinal contours are within normal limits. Both lungs are clear. The visualized skeletal structures are unremarkable. IMPRESSION: No active disease. Electronically Signed   By: Darliss Cheney M.D.   On: 08/01/2023 23:59     Data Reviewed: Relevant notes from primary care and specialist visits, past discharge summaries as available in EHR, including Care Everywhere. Prior diagnostic testing as pertinent to current admission diagnoses Updated medications and problem lists for reconciliation ED course, including vitals, labs, imaging, treatment and response to treatment Triage notes, nursing and pharmacy notes and ED provider's notes Notable results as noted in HPI   Assessment and Plan: * Sepsis (HCC) Sepsis criteria include fever, tachycardia, leukocytosis with lactic acidosis Urinalysis and chest x-ray unrevealing Will get CT abdomen and pelvis given the patient was dry heaving Continue broad-spectrum antibiotics with with cefepime vancomycin and Flagyl IV fluids Follow blood cultures Will keep n.p.o. tonight  Acute metabolic encephalopathy Secondary to sepsis Neurologic checks Fall and aspiration precautions  Essential (primary) hypertension Hold home antihypertensives of lisinopril, HCTZ and hydralazine in the setting of sepsis IV hydralazine if needed for BP control while n.p.o.  Type 2 diabetes mellitus (HCC) Sliding  scale insulin coverage  Chronic obstructive pulmonary disease (COPD) (HCC) Albuterol as needed     DVT prophylaxis: Lovenox  Consults: none  Advance Care Planning:   Code Status: Prior   Family Communication: wife at bedside  Disposition Plan: Back to  previous home environment  Severity of Illness: The appropriate patient status for this patient is INPATIENT. Inpatient status is judged to be reasonable and necessary in order to provide the required intensity of service to ensure the patient's safety. The patient's presenting symptoms, physical exam findings, and initial radiographic and laboratory data in the context of their chronic comorbidities is felt to place them at high risk for further clinical deterioration. Furthermore, it is not anticipated that the patient will be medically stable for discharge from the hospital within 2 midnights of admission.   * I certify that at the point of admission it is my clinical judgment that the patient will require inpatient hospital care spanning beyond 2 midnights from the point of admission due to high intensity of service, high risk for further deterioration and high frequency of surveillance required.*  Author: Andris Baumann, MD 08/02/2023 1:47 AM  For on call review [email or web address].

## 2023-08-02 NOTE — Progress Notes (Signed)
Patient arrived to floor from ED, disoriented x4, unable to move his right arm. Sometimes responds to questions with single words and sometimes just looks at staff with no real response. Right eye unresponsive to light and not moving. Confusion is the reason he was admitted but is not baseline for him. CT of head was negative earlier today.  I notified Randye Lobo NP that I was concerned about this patient who cannot tell me his history because he is not oriented. Upon furthering looking into chart it was found that he has a right prosthetic eye and has had a prior surgery to right rotator cuff. He is able to move his lower extremities bilaterally. Randye Lobo NP at bedside to assess patient. No new orders placed. Will continue to monitor patient

## 2023-08-02 NOTE — ED Notes (Signed)
 obtained from straight cath, pt tolerated well. Sterile technique used

## 2023-08-02 NOTE — Sepsis Progress Note (Signed)
Secure chat sent to bedside nurse regarding second lactic acid inquiring if it has been sent to lab.  Awaiting response.

## 2023-08-02 NOTE — ED Notes (Signed)
Pt incontinent of urine and stool. Pt cleaned and changed.

## 2023-08-02 NOTE — Assessment & Plan Note (Signed)
Sliding scale insulin coverage 

## 2023-08-02 NOTE — ED Notes (Signed)
US bladder 

## 2023-08-02 NOTE — Assessment & Plan Note (Signed)
Albuterol as needed

## 2023-08-02 NOTE — Progress Notes (Addendum)
  Inpatient Rehab Admissions Coordinator :  Per therapy recommendations patient was screened for CIR candidacy by Ottie Glazier RN MSN. Patient just admitted with sepsis. I would like to see how he progresses as sepsis is treated to assist in determining most appropriate rehab venue. [employer] unlikely to approve CIR /AIR level rehab with sepsis diagnosis unless he has prolonged complex Hospital course. Admitted for 18 hrs thus far.  I will follow his progress and place a Rehab Consult order if felt to be appropriate. Please contact me with any questions.  Ottie Glazier RN MSN Admissions Coordinator [phone]

## 2023-08-02 NOTE — Progress Notes (Signed)
PROGRESS NOTE    Jeremy Valencia  XBJ:[id number] DOB: 12-20-1940 DOA: 08/01/2023 PCP: Lynnea Ferrier, MD   Assessment & Plan:   Principal Problem:   Sepsis [facility]) Active Problems:   Acute metabolic encephalopathy   Essential (primary) hypertension   Type 2 diabetes mellitus (HCC)   Chronic obstructive pulmonary disease (COPD) (HCC)  Assessment and Plan: SIRS & likely sepsis: met criteria w/  fever, tachycardia, leukocytosis but no source. CXR was normal. Blood cxs NGTD. UA was unremarkable. CT head shows no acute intracranial abnormalities. Pt cannot have an MRI as pt has metal in his body. Continue on empiric abxs w/ IV cefepime, vanco.    Acute metabolic encephalopathy: likely secondary to sepsis. Re-orient prn   HTN: holding home lisinopril, hydralazine, HCTZ  DM2: fair control, HbA1c 7.3. Start SSI w/ accuchecks    COPD: w/o exacerbation. Bronchodilators prn       DVT prophylaxis: lovenox  Code Status: full  Family Communication: discussed pt's care w/ pt's family and answered their questions  Disposition Plan: depends on PT/OT recs   Level of care: Telemetry Medical Consultants:    Procedures:   Antimicrobials: cefepime, vanco    Subjective: Pt is lethargic   Objective: Vitals:   08/02/23 0326 08/02/23 0336 08/02/23 0726 08/02/23 0729  BP:    (!) 155/92  Pulse:    99  Resp:    (!) 22  Temp: 98.9 F (37.2 C)   98.3 F (36.8 C)  TempSrc: Oral   Oral  SpO2:   100% 100%  Weight:  94.2 kg    Height:  6\' 1"  (1.854 m)      Intake/Output Summary (Last 24 hours) at 08/02/2023 1004 Last data filed at 08/02/2023 0910 Gross per 24 hour  Intake 1000 ml  Output --  Net 1000 ml   Filed Weights   08/02/23 0336  Weight: 94.2 kg    Examination:  General exam: Appears calm but uncomfortable  Respiratory system: Clear to auscultation. Respiratory effort normal. Cardiovascular system: S1 & S2+. No  rubs, gallops or clicks.  Gastrointestinal system:  Abdomen is nondistended, soft and nontender. Normal bowel sounds heard. Central nervous system: lethargic.  Psychiatry: Judgement and insight appears poor. Flat mood and affect     Data Reviewed: I have personally reviewed following labs and imaging studies  CBC: Recent Labs  Lab 08/01/23 2209 08/02/23 0430  WBC 13.7* 13.8*  NEUTROABS 10.7*  --   HGB 14.2 14.9  HCT 41.8 43.3  MCV 98.6 95.4  PLT 195 206   Basic Metabolic Panel: Recent Labs  Lab 08/01/23 2209 08/02/23 0430  NA 132* 134*  K 3.8 3.8  CL 103 103  CO2 22 24  GLUCOSE 142* 166*  BUN 27* 26*  CREATININE 1.05 1.04  CALCIUM 8.0* 8.2*   GFR: Estimated Creatinine Clearance: 59.8 mL/min (by C-G formula based on SCr of 1.04 mg/dL). Liver Function Tests: Recent Labs  Lab 08/01/23 2209 08/02/23 0430  AST 14* 17  ALT 15 18  ALKPHOS 42 43  BILITOT 0.9 1.4*  PROT 6.2* 6.7  ALBUMIN 3.1* 3.4*   No results for input(s): "LIPASE", "AMYLASE" in the last 168 hours. No results for input(s): "AMMONIA" in the last 168 hours. Coagulation Profile: Recent Labs  Lab 08/01/23 2209  INR 1.2   Cardiac Enzymes: No results for input(s): "CKTOTAL", "CKMB", "CKMBINDEX", "TROPONINI" in the last 168 hours. BNP (last 3 results) No results for input(s): "PROBNP" in the last 8760 hours.  HbA1C: Recent Labs    08/01/23 2209  HGBA1C 7.3*   CBG: No results for input(s): "GLUCAP" in the last 168 hours. Lipid Profile: No results for input(s): "CHOL", "HDL", "LDLCALC", "TRIG", "CHOLHDL", "LDLDIRECT" in the last 72 hours. Thyroid Function Tests: No results for input(s): "TSH", "T4TOTAL", "FREET4", "T3FREE", "THYROIDAB" in the last 72 hours. Anemia Panel: No results for input(s): "VITAMINB12", "FOLATE", "FERRITIN", "TIBC", "IRON", "RETICCTPCT" in the last 72 hours. Sepsis Labs: Recent Labs  Lab 08/01/23 2209 08/02/23 0430  LATICACIDVEN 2.4* 0.7    Recent Results (from the past 240 hour(s))  Resp panel by RT-PCR (RSV, Flu  A&B, Covid) Anterior Nasal Swab     Status: None   Collection Time: 08/01/23 10:09 PM   Specimen: Anterior Nasal Swab  Result Value Ref Range Status   SARS Coronavirus 2 by RT PCR NEGATIVE NEGATIVE Final    Comment: (NOTE) SARS-CoV-2 target nucleic acids are NOT DETECTED.  The SARS-CoV-2 RNA is generally detectable in upper respiratory specimens during the acute phase of infection. The lowest concentration of SARS-CoV-2 viral copies this assay can detect is 138 copies/mL. A negative result does not preclude SARS-Cov-2 infection and should not be used as the sole basis for treatment or other patient management decisions. A negative result may occur with  improper specimen collection/handling, submission of specimen other than nasopharyngeal swab, presence of viral mutation(s) within the areas targeted by this assay, and inadequate number of viral copies(<138 copies/mL). A negative result must be combined with clinical observations, patient history, and epidemiological information. The expected result is Negative.  Fact Sheet for Patients:  BloggerCourse.com  Fact Sheet for Healthcare Providers:  SeriousBroker.it  This test is no t yet approved or cleared by the [place] FDA and  has been authorized for detection and/or diagnosis of SARS-CoV-2 by FDA under an Emergency Use Authorization (EUA). This EUA will remain  in effect (meaning this test can be used) for the duration of the COVID-19 declaration under Section 564(b)(1) of the Act, 21 U.S.C.section 360bbb-3(b)(1), unless the authorization is terminated  or revoked sooner.       Influenza A by PCR NEGATIVE NEGATIVE Final   Influenza B by PCR NEGATIVE NEGATIVE Final    Comment: (NOTE) The Xpert Xpress SARS-CoV-2/FLU/RSV plus assay is intended as an aid in the diagnosis of influenza from Nasopharyngeal swab specimens and should not be used as a sole basis for treatment.  Nasal washings and aspirates are unacceptable for Xpert Xpress SARS-CoV-2/FLU/RSV testing.  Fact Sheet for Patients: BloggerCourse.com  Fact Sheet for Healthcare Providers: SeriousBroker.it  This test is not yet approved or cleared by the [place] FDA and has been authorized for detection and/or diagnosis of SARS-CoV-2 by FDA under an Emergency Use Authorization (EUA). This EUA will remain in effect (meaning this test can be used) for the duration of the COVID-19 declaration under Section 564(b)(1) of the Act, 21 U.S.C. section 360bbb-3(b)(1), unless the authorization is terminated or revoked.     Resp Syncytial Virus by PCR NEGATIVE NEGATIVE Final    Comment: (NOTE) Fact Sheet for Patients: BloggerCourse.com  Fact Sheet for Healthcare Providers: SeriousBroker.it  This test is not yet approved or cleared by the [place] FDA and has been authorized for detection and/or diagnosis of SARS-CoV-2 by FDA under an Emergency Use Authorization (EUA). This EUA will remain in effect (meaning this test can be used) for the duration of the COVID-19 declaration under Section 564(b)(1) of the Act, 21 U.S.C. section 360bbb-3(b)(1), unless the authorization  is terminated or revoked.  Performed at [facility], [address] [place], [place] [address]   Blood Culture (routine x 2)     Status: None (Preliminary result)   Collection Time: 08/01/23 10:09 PM   Specimen: BLOOD  Result Value Ref Range Status   Specimen Description BLOOD RIGHT ARM  Final   Special Requests   Final    BOTTLES DRAWN AEROBIC AND ANAEROBIC Blood Culture adequate volume   Culture   Final    NO GROWTH < 12 HOURS Performed at [facility], [address], [place], [place] [address]    Report Status PENDING  Incomplete  Blood Culture (routine x 2)     Status: None (Preliminary result)    Collection Time: 08/01/23 10:09 PM   Specimen: BLOOD  Result Value Ref Range Status   Specimen Description BLOOD LEFT WRIST  Final   Special Requests   Final    BOTTLES DRAWN AEROBIC AND ANAEROBIC Blood Culture adequate volume   Culture   Final    NO GROWTH < 12 HOURS Performed at [facility], [address], [place], [place] [address]    Report Status PENDING  Incomplete         Radiology Studies: CT HEAD WO CONTRAST ( )  Result Date: 08/02/2023 CLINICAL DATA:  Mental status change, unknown cause EXAM: CT HEAD WITHOUT CONTRAST TECHNIQUE: Contiguous axial images were obtained from the base of the skull through the vertex without intravenous contrast. RADIATION DOSE REDUCTION: This exam was performed according to the departmental dose-optimization program which includes automated exposure control, adjustment of the mA and/or kV according to patient size and/or use of iterative reconstruction technique. COMPARISON:  11/15/2021 FINDINGS: Brain: Moderate parenchymal volume loss is commensurate with the patient's age. Moderate periventricular white matter changes are present, stable since prior examination, likely the sequela of small vessel ischemia. Interval development of small focus of cortical encephalomalacia within the right parietal lobe in keeping with remote infarct or trauma though new from prior examination. No evidence of acute intracranial hemorrhage or infarct. No abnormal mass effect or midline shift. No abnormal intra or extra-axial mass lesion ventricular size is normal. Cerebellum is unremarkable. Vascular: No hyperdense vessel or unexpected calcification. Skull: Normal. Negative for fracture or focal lesion. 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 Left orbit is unremarkable. Paranasal sinuses are clear. Other: Mastoid air cells and middle ear cavities are clear. IMPRESSION: 1. No acute intracranial hemorrhage or infarct. 2. Interval development of small  focus of cortical encephalomalacia within the right parietal lobe in keeping with remote infarct or trauma though new from prior examination. 3. Stable moderate senescent change. Electronically Signed   By: Helyn Numbers M.D.   On: 08/02/2023 03:01   CT ABDOMEN PELVIS WO CONTRAST  Result Date: 08/02/2023 CLINICAL DATA:  Sepsis EXAM: CT ABDOMEN AND PELVIS WITHOUT CONTRAST TECHNIQUE: Multidetector CT imaging of the abdomen and pelvis was performed following the standard protocol without IV contrast. RADIATION DOSE REDUCTION: This exam was performed according to the departmental dose-optimization program which includes automated exposure control, adjustment of the mA and/or kV according to patient size and/or use of iterative reconstruction technique. COMPARISON:  03/02/2015 FINDINGS: Lower chest: No acute abnormality. Extensive multi-vessel coronary artery calcification. Punctate metallic shrapnel noted within the visualized anterior chest wall. Hepatobiliary: No focal liver abnormality is seen. No gallstones, gallbladder wall thickening, or biliary dilatation. Pancreas: Unremarkable Spleen: Unremarkable Adrenals/Urinary Tract: The adrenal glands are unremarkable. The kidneys are normal in size  and position. Stable moderate bilateral perinephric stranding, nonspecific. No hydronephrosis. Note is made of an extrarenal pelvis on the right. No intrarenal or ureteral calcifications. The bladder is mildly distended, but is otherwise unremarkable. Stomach/Bowel: Severe descending and sigmoid colonic diverticulosis. The stomach, small bowel, and large bowel are otherwise unremarkable. No evidence of obstruction or focal inflammation. The appendix is absent. Vascular/Lymphatic: Extensive aortoiliac atherosclerotic calcification. No aortic aneurysm. No pathologic adenopathy within the abdomen and pelvis. Reproductive: Mild prostatic hypertrophy Other: Small fat containing bilateral inguinal hernias, right greater than  left. Musculoskeletal: L3-L5 anterior lumbar fusion with instrumentation with bilateral facetectomy at each level in seen degenerative changes are seen at L2-3. No acute bone abnormality. No lytic or blastic bone lesion. IMPRESSION: 1. No acute intra-abdominal pathology identified. No definite radiographic explanation for the patient's reported symptoms. 2. Extensive multi-vessel coronary artery calcification. 3. Severe distal colonic diverticulosis without superimposed acute inflammatory change. 4. Mild prostatic hypertrophy. Aortic Atherosclerosis (ICD10-I70.0). Electronically Signed   By: Helyn Numbers M.D.   On: 08/02/2023 02:57   DG Chest Port 1 View  Result Date: 08/01/2023 CLINICAL DATA:  Questionable sepsis EXAM: PORTABLE CHEST 1 VIEW COMPARISON:  Chest x-ray 11/15/2021 FINDINGS: Small metallic BBs overlie the left chest and central chest, unchanged from prior. The heart size and mediastinal contours are within normal limits. Both lungs are clear. The visualized skeletal structures are unremarkable. IMPRESSION: No active disease. Electronically Signed   By: Darliss Cheney M.D.   On: 08/01/2023 23:59        Scheduled Meds:  enoxaparin (LOVENOX) injection  40 mg Subcutaneous Q24H   ketoconazole   Topical BID   Continuous Infusions:  ceFEPime (MAXIPIME) IV Stopped (08/02/23 0910)   lactated ringers 150 mL/hr at 08/02/23 0735   metronidazole 500 mg (08/02/23 0919)   vancomycin 1,750 mg (08/02/23 0916)     LOS: 0 days       Charise Killian, MD [facility] Pager [phone]  If 7PM-7AM, please contact night-coverage [email or web address] 08/02/2023, 10:04 AM

## 2023-08-02 NOTE — Assessment & Plan Note (Addendum)
Hold home antihypertensives of lisinopril, HCTZ and hydralazine in the setting of sepsis IV hydralazine if needed for BP control while n.p.o.

## 2023-08-03 ENCOUNTER — Inpatient Hospital Stay: Payer: Medicare HMO

## 2023-08-03 DIAGNOSIS — I639 Cerebral infarction, unspecified: Secondary | ICD-10-CM | POA: Diagnosis not present

## 2023-08-03 DIAGNOSIS — R4182 Altered mental status, unspecified: Secondary | ICD-10-CM

## 2023-08-03 DIAGNOSIS — I6389 Other cerebral infarction: Secondary | ICD-10-CM | POA: Diagnosis not present

## 2023-08-03 DIAGNOSIS — R4702 Dysphasia: Secondary | ICD-10-CM

## 2023-08-03 LAB — PROTIME-INR
INR: 1.1 (ref 0.8–1.2)
Prothrombin Time: 14.6 s (ref 11.4–15.2)

## 2023-08-03 LAB — BASIC METABOLIC PANEL
Anion gap: 11 (ref 5–15)
BUN: 24 mg/dL — ABNORMAL HIGH (ref 8–23)
CO2: 23 mmol/L (ref 22–32)
Calcium: 8.2 mg/dL — ABNORMAL LOW (ref 8.9–10.3)
Chloride: 103 mmol/L (ref 98–111)
Creatinine, Ser: 0.9 mg/dL (ref 0.61–1.24)
GFR, Estimated: >60 mL/min (ref >60)
Glucose, Bld: 84 mg/dL (ref 70–99)
Potassium: 3.8 mmol/L (ref 3.5–5.1)
Sodium: 137 mmol/L (ref 135–145)

## 2023-08-03 LAB — CBC
HCT: 41.6 % (ref 39.0–52.0)
Hemoglobin: 14.5 g/dL (ref 13.0–17.0)
MCH: 33.6 pg (ref 26.0–34.0)
MCHC: 34.9 g/dL (ref 30.0–36.0)
MCV: 96.3 fL (ref 80.0–100.0)
Platelets: 207 10*3/uL (ref 150–400)
RBC: 4.32 MIL/uL (ref 4.22–5.81)
RDW: 11.8 % (ref 11.5–15.5)
WBC: 12.6 10*3/uL — ABNORMAL HIGH (ref 4.0–10.5)
nRBC: 0 % (ref 0.0–0.2)

## 2023-08-03 LAB — GLUCOSE, CAPILLARY
Glucose-Capillary: 102 mg/dL — ABNORMAL HIGH (ref 70–99)
Glucose-Capillary: 109 mg/dL — ABNORMAL HIGH (ref 70–99)
Glucose-Capillary: 114 mg/dL — ABNORMAL HIGH (ref 70–99)
Glucose-Capillary: 123 mg/dL — ABNORMAL HIGH (ref 70–99)
Glucose-Capillary: 124 mg/dL — ABNORMAL HIGH (ref 70–99)
Glucose-Capillary: 77 mg/dL (ref 70–99)
Glucose-Capillary: 88 mg/dL (ref 70–99)

## 2023-08-03 MED ORDER — SODIUM CHLORIDE 0.9 % IV SOLN
2.0000 g | INTRAVENOUS | Status: DC
  Administered 2023-08-03 – 2023-08-05 (×3): 2 g via INTRAVENOUS
  Filled 2023-08-03 (×4): qty 20

## 2023-08-03 MED ORDER — DOXYCYCLINE HYCLATE 100 MG IV SOLR
100.0000 mg | Freq: Two times a day (BID) | INTRAVENOUS | Status: DC
  Administered 2023-08-03 – 2023-08-05 (×4): 100 mg via INTRAVENOUS
  Filled 2023-08-03 (×5): qty 100

## 2023-08-03 MED ORDER — SODIUM CHLORIDE 0.9 % IV SOLN: 2.0000 g | Freq: Three times a day (TID) | INTRAVENOUS | Status: DC

## 2023-08-03 MED ORDER — ENSURE ENLIVE PO LIQD
237.0000 mL | Freq: Two times a day (BID) | ORAL | Status: DC
  Administered 2023-08-04 – 2023-08-11 (×7): 237 mL via ORAL

## 2023-08-03 MED ORDER — ASPIRIN 81 MG PO TBEC
81.0000 mg | DELAYED_RELEASE_TABLET | Freq: Every day | ORAL | Status: DC
  Administered 2023-08-03 – 2023-08-08 (×6): 81 mg via ORAL
  Filled 2023-08-03 (×7): qty 1

## 2023-08-03 MED ORDER — DEXTROSE-SODIUM CHLORIDE 5-0.9 % IV SOLN: INTRAVENOUS | Status: AC

## 2023-08-03 MED ORDER — ATORVASTATIN CALCIUM 20 MG PO TABS
20.0000 mg | ORAL_TABLET | Freq: Every day | ORAL | Status: DC
  Administered 2023-08-03 – 2023-08-11 (×9): 20 mg via ORAL
  Filled 2023-08-03 (×10): qty 1

## 2023-08-03 MED ORDER — CLOPIDOGREL BISULFATE 75 MG PO TABS
75.0000 mg | ORAL_TABLET | Freq: Every day | ORAL | Status: DC
  Administered 2023-08-04 – 2023-08-11 (×8): 75 mg via ORAL
  Filled 2023-08-03 (×9): qty 1

## 2023-08-03 NOTE — Progress Notes (Signed)
Mobility Specialist - Progress Note    08/03/23 1133  Mobility  Activity Stood at bedside  Level of Assistance Moderate assist, patient does 50-74%  Assistive Device Front wheel walker  Distance Ambulated (ft) 1 ft  Range of Motion/Exercises Active  Activity Response Tolerated well  Mobility Referral Yes  $Mobility charge 1 Mobility  Mobility Specialist Start Time (ACUTE ONLY) 1045   Pt resting in bed on RA upon entry. Pt moved to EOB ModA and required assistance to sitting EOB. Pt could not completely hold himself up due to guarding with LUE and unability to use RUE currently. Pt STS +2 (for safety) to RW Min/ModA. Pt responds to manual cuing to put left hand on walker. Pt answered all questions with yes or no answers. Pt returned to bed to conduct full bath due to BM. Pt left with needs in reach and bed alarm activated.   Johnathan Hausen Mobility Specialist 08/03/23, 11:41 AM

## 2023-08-03 NOTE — Progress Notes (Signed)
PROGRESS NOTE    Jeremy Valencia  ZOX:[id number] DOB: 07-Sep-1940 DOA: 08/01/2023 PCP: Lynnea Ferrier, MD   Assessment & Plan:   Principal Problem:   Sepsis [facility]) Active Problems:   Acute metabolic encephalopathy   Essential (primary) hypertension   Type 2 diabetes mellitus (HCC)   Chronic obstructive pulmonary disease (COPD) (HCC)  Assessment and Plan: CVA: repeat CVA shows punctate focus in posterior right frontal lobe, may represent a small acute or subacute infarct, punctate focus in the left parietal lobe. Neuro consulted. Pt has metal in his body so unable to get an MRI.  PT/OT recs acute rehab. Speech consulted but has not seen the pt yet.   SIRS & likely sepsis: met criteria w/ fever, tachycardia, leukocytosis but no source. CXR was normal. Blood cxs NGTD. UA was unremarkable. CT head shows no acute intracranial abnormalities. Pt cannot have an MRI as pt has metal in his body. Cefepime changed to IV ceftriaxone and vanco changed to doxycycline   Acute metabolic encephalopathy: likely secondary to CVA. Re-orient prn.    HTN: continue to hold home lisinopril, hydralazine, HCTZ  DM2: fair control, HbA1c 7.3. Continue on SSI w/ accuchecks    COPD: w/o exacerbation. Bronchodilators prn       DVT prophylaxis: lovenox  Code Status: full  Family Communication: discussed pt's care w/ pt's family and answered their questions  Disposition Plan: likely d/c to acute rehab   Level of care: Telemetry Medical  Status is: Inpatient Remains inpatient appropriate because: CVA    Consultants:  Neuro   Procedures:   Antimicrobials: ceftriaxone, doxy    Subjective: Pt c/o back pain   Objective: Vitals:   08/02/23 2209 08/02/23 2210 08/02/23 2252 08/03/23 0407  BP: 138/83  136/73 (!) 146/75  Pulse: 85  90 97  Resp:  17 17 16   Temp: 99.4 F (37.4 C)  99.8 F (37.7 C) 98.7 F (37.1 C)  TempSrc: Oral  Oral Oral  SpO2: 97%  95% 93%  Weight:      Height:         Intake/Output Summary (Last 24 hours) at 08/03/2023 0824 Last data filed at 08/03/2023 0658 Gross per 24 hour  Intake 1242.73 ml  Output --  Net 1242.73 ml   Filed Weights   08/02/23 0336  Weight: 94.2 kg    Examination:  General exam: Appears uncomfortable  Respiratory system: clear breath sounds b/l  Cardiovascular system: S1/S2+. No rubs or clicks  Gastrointestinal system: abd is soft, NT, ND & hypoactive bowel sounds  Central nervous system: awake and alert  Psychiatry: judgement and insight appears poor. Flat mood and affect    Data Reviewed: I have personally reviewed following labs and imaging studies  CBC: Recent Labs  Lab 08/01/23 2209 08/02/23 0430 08/03/23 0304  WBC 13.7* 13.8* 12.6*  NEUTROABS 10.7*  --   --   HGB 14.2 14.9 14.5  HCT 41.8 43.3 41.6  MCV 98.6 95.4 96.3  PLT 195 206 207   Basic Metabolic Panel: Recent Labs  Lab 08/01/23 2209 08/02/23 0430 08/03/23 0304  NA 132* 134* 137  K 3.8 3.8 3.8  CL 103 103 103  CO2 22 24 23   GLUCOSE 142* 166* 84  BUN 27* 26* 24*  CREATININE 1.05 1.04 0.90  CALCIUM 8.0* 8.2* 8.2*   GFR: Estimated Creatinine Clearance: 69 mL/min (by C-G formula based on SCr of 0.9 mg/dL). Liver Function Tests: Recent Labs  Lab 08/01/23 2209 08/02/23 0430  AST  14* 17  ALT 15 18  ALKPHOS 42 43  BILITOT 0.9 1.4*  PROT 6.2* 6.7  ALBUMIN 3.1* 3.4*   No results for input(s): "LIPASE", "AMYLASE" in the last 168 hours. No results for input(s): "AMMONIA" in the last 168 hours. Coagulation Profile: Recent Labs  Lab 08/01/23 2209 08/03/23 0021  INR 1.2 1.1   Cardiac Enzymes: No results for input(s): "CKTOTAL", "CKMB", "CKMBINDEX", "TROPONINI" in the last 168 hours. BNP (last 3 results) No results for input(s): "PROBNP" in the last 8760 hours. HbA1C: Recent Labs    08/01/23 2209  HGBA1C 7.3*   CBG: Recent Labs  Lab 08/02/23 1839 08/02/23 1937 08/03/23 0023 08/03/23 0351 08/03/23 0821  GLUCAP 57*  110* 77 102* 88   Lipid Profile: No results for input(s): "CHOL", "HDL", "LDLCALC", "TRIG", "CHOLHDL", "LDLDIRECT" in the last 72 hours. Thyroid Function Tests: No results for input(s): "TSH", "T4TOTAL", "FREET4", "T3FREE", "THYROIDAB" in the last 72 hours. Anemia Panel: No results for input(s): "VITAMINB12", "FOLATE", "FERRITIN", "TIBC", "IRON", "RETICCTPCT" in the last 72 hours. Sepsis Labs: Recent Labs  Lab 08/01/23 2209 08/02/23 0430  PROCALCITON  --  <0.10  LATICACIDVEN 2.4* 0.7    Recent Results (from the past 240 hour(s))  Resp panel by RT-PCR (RSV, Flu A&B, Covid) Anterior Nasal Swab     Status: None   Collection Time: 08/01/23 10:09 PM   Specimen: Anterior Nasal Swab  Result Value Ref Range Status   SARS Coronavirus 2 by RT PCR NEGATIVE NEGATIVE Final    Comment: (NOTE) SARS-CoV-2 target nucleic acids are NOT DETECTED.  The SARS-CoV-2 RNA is generally detectable in upper respiratory specimens during the acute phase of infection. The lowest concentration of SARS-CoV-2 viral copies this assay can detect is 138 copies/mL. A negative result does not preclude SARS-Cov-2 infection and should not be used as the sole basis for treatment or other patient management decisions. A negative result may occur with  improper specimen collection/handling, submission of specimen other than nasopharyngeal swab, presence of viral mutation(s) within the areas targeted by this assay, and inadequate number of viral copies(<138 copies/mL). A negative result must be combined with clinical observations, patient history, and epidemiological information. The expected result is Negative.  Fact Sheet for Patients:  BloggerCourse.com  Fact Sheet for Healthcare Providers:  SeriousBroker.it  This test is no t yet approved or cleared by the [place] FDA and  has been authorized for detection and/or diagnosis of SARS-CoV-2 by FDA under an  Emergency Use Authorization (EUA). This EUA will remain  in effect (meaning this test can be used) for the duration of the COVID-19 declaration under Section 564(b)(1) of the Act, 21 U.S.C.section 360bbb-3(b)(1), unless the authorization is terminated  or revoked sooner.       Influenza A by PCR NEGATIVE NEGATIVE Final   Influenza B by PCR NEGATIVE NEGATIVE Final    Comment: (NOTE) The Xpert Xpress SARS-CoV-2/FLU/RSV plus assay is intended as an aid in the diagnosis of influenza from Nasopharyngeal swab specimens and should not be used as a sole basis for treatment. Nasal washings and aspirates are unacceptable for Xpert Xpress SARS-CoV-2/FLU/RSV testing.  Fact Sheet for Patients: BloggerCourse.com  Fact Sheet for Healthcare Providers: SeriousBroker.it  This test is not yet approved or cleared by the [place] FDA and has been authorized for detection and/or diagnosis of SARS-CoV-2 by FDA under an Emergency Use Authorization (EUA). This EUA will remain in effect (meaning this test can be used) for the duration of the COVID-19 declaration under  Section 564(b)(1) of the Act, 21 U.S.C. section 360bbb-3(b)(1), unless the authorization is terminated or revoked.     Resp Syncytial Virus by PCR NEGATIVE NEGATIVE Final    Comment: (NOTE) Fact Sheet for Patients: BloggerCourse.com  Fact Sheet for Healthcare Providers: SeriousBroker.it  This test is not yet approved or cleared by the [place] FDA and has been authorized for detection and/or diagnosis of SARS-CoV-2 by FDA under an Emergency Use Authorization (EUA). This EUA will remain in effect (meaning this test can be used) for the duration of the COVID-19 declaration under Section 564(b)(1) of the Act, 21 U.S.C. section 360bbb-3(b)(1), unless the authorization is terminated or revoked.  Performed at [facility], [address] [place], [place] [address]   Blood Culture (routine x 2)     Status: None (Preliminary result)   Collection Time: 08/01/23 10:09 PM   Specimen: BLOOD  Result Value Ref Range Status   Specimen Description BLOOD RIGHT ARM  Final   Special Requests   Final    BOTTLES DRAWN AEROBIC AND ANAEROBIC Blood Culture adequate volume   Culture   Final    NO GROWTH 2 DAYS Performed at [facility], [address], [place], [place] [address]    Report Status PENDING  Incomplete  Blood Culture (routine x 2)     Status: None (Preliminary result)   Collection Time: 08/01/23 10:09 PM   Specimen: BLOOD  Result Value Ref Range Status   Specimen Description BLOOD LEFT WRIST  Final   Special Requests   Final    BOTTLES DRAWN AEROBIC AND ANAEROBIC Blood Culture adequate volume   Culture   Final    NO GROWTH 2 DAYS Performed at [facility], [address] [place], [place] [address]    Report Status PENDING  Incomplete         Radiology Studies: CT HEAD WO CONTRAST ( )  Result Date: 08/02/2023 CLINICAL DATA:  Mental status change, unknown cause EXAM: CT HEAD WITHOUT CONTRAST TECHNIQUE: Contiguous axial images were obtained from the base of the skull through the vertex without intravenous contrast. RADIATION DOSE REDUCTION: This exam was performed according to the departmental dose-optimization program which includes automated exposure control, adjustment of the mA and/or kV according to patient size and/or use of iterative reconstruction technique. COMPARISON:  11/15/2021 FINDINGS: Brain: Moderate parenchymal volume loss is commensurate with the patient's age. Moderate periventricular white matter changes are present, stable since prior examination, likely the sequela of small vessel ischemia. Interval development of small focus of cortical encephalomalacia within the right parietal lobe in keeping with remote infarct or trauma though new from prior  examination. No evidence of acute intracranial hemorrhage or infarct. No abnormal mass effect or midline shift. No abnormal intra or extra-axial mass lesion ventricular size is normal. Cerebellum is unremarkable. Vascular: No hyperdense vessel or unexpected calcification. Skull: Normal. Negative for fracture or focal lesion. 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 Left orbit is unremarkable. Paranasal sinuses are clear. Other: Mastoid air cells and middle ear cavities are clear. IMPRESSION: 1. No acute intracranial hemorrhage or infarct. 2. Interval development of small focus of cortical encephalomalacia within the right parietal lobe in keeping with remote infarct or trauma though new from prior examination. 3. Stable moderate senescent change. Electronically Signed   By: Helyn Numbers M.D.   On: 08/02/2023 03:01   CT ABDOMEN PELVIS WO CONTRAST  Result Date: 08/02/2023 CLINICAL DATA:  Sepsis EXAM: CT ABDOMEN AND PELVIS WITHOUT CONTRAST TECHNIQUE:  Multidetector CT imaging of the abdomen and pelvis was performed following the standard protocol without IV contrast. RADIATION DOSE REDUCTION: This exam was performed according to the departmental dose-optimization program which includes automated exposure control, adjustment of the mA and/or kV according to patient size and/or use of iterative reconstruction technique. COMPARISON:  03/02/2015 FINDINGS: Lower chest: No acute abnormality. Extensive multi-vessel coronary artery calcification. Punctate metallic shrapnel noted within the visualized anterior chest wall. Hepatobiliary: No focal liver abnormality is seen. No gallstones, gallbladder wall thickening, or biliary dilatation. Pancreas: Unremarkable Spleen: Unremarkable Adrenals/Urinary Tract: The adrenal glands are unremarkable. The kidneys are normal in size and position. Stable moderate bilateral perinephric stranding, nonspecific. No hydronephrosis. Note is made of an extrarenal pelvis on the  right. No intrarenal or ureteral calcifications. The bladder is mildly distended, but is otherwise unremarkable. Stomach/Bowel: Severe descending and sigmoid colonic diverticulosis. The stomach, small bowel, and large bowel are otherwise unremarkable. No evidence of obstruction or focal inflammation. The appendix is absent. Vascular/Lymphatic: Extensive aortoiliac atherosclerotic calcification. No aortic aneurysm. No pathologic adenopathy within the abdomen and pelvis. Reproductive: Mild prostatic hypertrophy Other: Small fat containing bilateral inguinal hernias, right greater than left. Musculoskeletal: L3-L5 anterior lumbar fusion with instrumentation with bilateral facetectomy at each level in seen degenerative changes are seen at L2-3. No acute bone abnormality. No lytic or blastic bone lesion. IMPRESSION: 1. No acute intra-abdominal pathology identified. No definite radiographic explanation for the patient's reported symptoms. 2. Extensive multi-vessel coronary artery calcification. 3. Severe distal colonic diverticulosis without superimposed acute inflammatory change. 4. Mild prostatic hypertrophy. Aortic Atherosclerosis (ICD10-I70.0). Electronically Signed   By: Helyn Numbers M.D.   On: 08/02/2023 02:57   DG Chest Port 1 View  Result Date: 08/01/2023 CLINICAL DATA:  Questionable sepsis EXAM: PORTABLE CHEST 1 VIEW COMPARISON:  Chest x-ray 11/15/2021 FINDINGS: Small metallic BBs overlie the left chest and central chest, unchanged from prior. The heart size and mediastinal contours are within normal limits. Both lungs are clear. The visualized skeletal structures are unremarkable. IMPRESSION: No active disease. Electronically Signed   By: Darliss Cheney M.D.   On: 08/01/2023 23:59        Scheduled Meds:  enoxaparin (LOVENOX) injection  40 mg Subcutaneous Q24H   insulin aspart  0-6 Units Subcutaneous Q4H   Continuous Infusions:  ceFEPime (MAXIPIME) IV Stopped (08/02/23 2210)   metronidazole  Stopped (08/02/23 2256)   vancomycin Stopped (08/02/23 1133)     LOS: 1 day       Charise Killian, MD [facility] Pager [phone]  If 7PM-7AM, please contact night-coverage [email or web address] 08/03/2023, 8:24 AM
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
## 2023-08-03 NOTE — Progress Notes (Signed)
Inpatient Rehabilitation Admissions Coordinator   Noted neuro consult. I will place rehab consult for full assessment for possible CIR admit.  Ottie Glazier, RN, MSN Rehab Admissions Coordinator [phone] 08/03/2023 7:00 PM

## 2023-08-04 ENCOUNTER — Inpatient Hospital Stay: Payer: Medicare HMO

## 2023-08-04 DIAGNOSIS — I639 Cerebral infarction, unspecified: Secondary | ICD-10-CM | POA: Diagnosis not present

## 2023-08-04 DIAGNOSIS — R4182 Altered mental status, unspecified: Secondary | ICD-10-CM | POA: Diagnosis not present

## 2023-08-04 DIAGNOSIS — I6389 Other cerebral infarction: Secondary | ICD-10-CM | POA: Diagnosis not present

## 2023-08-04 DIAGNOSIS — R4702 Dysphasia: Secondary | ICD-10-CM | POA: Diagnosis not present

## 2023-08-04 LAB — CBC
HCT: 42.5 % (ref 39.0–52.0)
Hemoglobin: 14.9 g/dL (ref 13.0–17.0)
MCH: 33.5 pg (ref 26.0–34.0)
MCHC: 35.1 g/dL (ref 30.0–36.0)
MCV: 95.5 fL (ref 80.0–100.0)
Platelets: 212 10*3/uL (ref 150–400)
RBC: 4.45 MIL/uL (ref 4.22–5.81)
RDW: 11.7 % (ref 11.5–15.5)
WBC: 11.9 10*3/uL — ABNORMAL HIGH (ref 4.0–10.5)
nRBC: 0 % (ref 0.0–0.2)

## 2023-08-04 LAB — BASIC METABOLIC PANEL
Anion gap: 11 (ref 5–15)
BUN: 23 mg/dL (ref 8–23)
CO2: 22 mmol/L (ref 22–32)
Calcium: 8.1 mg/dL — ABNORMAL LOW (ref 8.9–10.3)
Chloride: 103 mmol/L (ref 98–111)
Creatinine, Ser: 0.92 mg/dL (ref 0.61–1.24)
GFR, Estimated: >60 mL/min (ref >60)
Glucose, Bld: 136 mg/dL — ABNORMAL HIGH (ref 70–99)
Potassium: 3.5 mmol/L (ref 3.5–5.1)
Sodium: 136 mmol/L (ref 135–145)

## 2023-08-04 LAB — GLUCOSE, CAPILLARY
Glucose-Capillary: 115 mg/dL — ABNORMAL HIGH (ref 70–99)
Glucose-Capillary: 128 mg/dL — ABNORMAL HIGH (ref 70–99)
Glucose-Capillary: 133 mg/dL — ABNORMAL HIGH (ref 70–99)
Glucose-Capillary: 134 mg/dL — ABNORMAL HIGH (ref 70–99)
Glucose-Capillary: 147 mg/dL — ABNORMAL HIGH (ref 70–99)
Glucose-Capillary: 195 mg/dL — ABNORMAL HIGH (ref 70–99)

## 2023-08-04 MED ORDER — LISINOPRIL 20 MG PO TABS
20.0000 mg | ORAL_TABLET | Freq: Every day | ORAL | Status: DC
  Administered 2023-08-05 – 2023-08-11 (×7): 20 mg via ORAL
  Filled 2023-08-04 (×8): qty 1

## 2023-08-04 MED ORDER — IOHEXOL 350 MG/ML SOLN
75.0000 mL | Freq: Once | INTRAVENOUS | Status: AC | PRN
  Administered 2023-08-04: 75 mL via INTRAVENOUS

## 2023-08-04 MED ORDER — DICLOFENAC SODIUM 1 % EX GEL
2.0000 g | Freq: Three times a day (TID) | CUTANEOUS | Status: DC | PRN
  Administered 2023-08-04 – 2023-08-05 (×2): 2 g via TOPICAL
  Filled 2023-08-04: qty 100

## 2023-08-04 MED ORDER — HYDRALAZINE HCL 25 MG PO TABS
25.0000 mg | ORAL_TABLET | Freq: Two times a day (BID) | ORAL | Status: DC
  Administered 2023-08-05 – 2023-08-11 (×13): 25 mg via ORAL
  Filled 2023-08-04 (×13): qty 1

## 2023-08-04 NOTE — Evaluation (Signed)
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    Diabetes mellitus without complication (HCC)    Diverticulosis    Emphysema, unspecified (HCC)    GERD (gastroesophageal reflux disease)    Hyperlipidemia    Hypertension    Plantar fasciitis    Past Surgical History:  Past Surgical History:  Procedure Laterality Date   APPENDECTOMY     BACK SURGERY     CATARACT EXTRACTION Left    COLONOSCOPY     COLONOSCOPY WITH PROPOFOL N/A 04/06/2021   Procedure: COLONOSCOPY WITH PROPOFOL;  Surgeon: Regis Bill, MD;  Location: [facility] ENDOSCOPY;  Service: Endoscopy;  Laterality: N/A;  DM   FOOT SURGERY     LAMINECTOMY     right eye     ROTATOR CUFF REPAIR Right    UPPER GI ENDOSCOPY     HPI:  Pt is a 82 y.o. male with medical history significant for COPD with emphysema, diabetes mellitus and hypertension being admitted with sepsis of unknown source after presenting with lethargy, chills, nausea and confusion.  Most of the history is given by the wife at the bedside who stated that patient was in his usual state of health earlier in the day and they ran a lot of errands however on the arrival home at 6:30 PM, he started feeling cold and went to lie down.  He remained in bed which is unusual for him and at about 9:30 PM she heard him retching and when she went to turn to him he was confused so she decided to get him to the hospital.   Wife states he has had UTIs in the past and this is how he presents.   Of note patient was hospitalized in 2023 as well as 2019 with sepsis of unknown source presenting with fever and leukocytosis.   CT of Abd/CXR: Negative. Lower chest: No acute abnormality. Extensive multi-vessel coronary  artery calcification. Punctate metallic shrapnel noted within the  visualized anterior chest wall.    CT of Head: Punctate focus of cortical hypoattenuation in the posterior right  frontal lobe, potentially new from prior exam. This may represent a  small acute or subacute infarct.  2. Punctate focus of cortical hypoattenuation in the left parietal  lobe is also potentially new from prior exam.  3. Stable focal area of hypoattenuation involving the right parietal  cortex. This likely represents a subacute or chronic infarct.  Recommend MRI of the brain without contrast for further evaluation  of these lesions.   4. Stable generalized Atrophy and white matter disease. This likely reflects the sequela of chronic microvascular ischemia.     Assessment / Plan / Recommendation  Clinical Impression   Pt seen for BSE today. Pt awake, verbal to direct questions w/ 1-2 moments of spontaneous verbal output occurring. Noted Cognitive-linguistic deficits c/b hesitation in response, word finding, and funny faces made when not knowing how to respond. Also noted some Cognitive components as well -- he often responded to  roo much talking in the room w/ "blah-blah-blah". He followed a direct, 1-step command w/ mod cue -- noted "pretend" feeding(hand to mouth) w/out bolus in hand yet. Family stated he had a good sense of humor baseline. Wife endorsed he was "somewhat forgetful" at home Prior To admit.  On RA, afebrile. WBC slightly elevated.  Pt appears to present w/ functional oropharyngeal phase swallowing w/ No overt oropharyngeal phase dysphagia noted, No sensorimotor deficits noted. Pt consumed po's w/ No overt, clinical s/s of aspiration during po trials.  Pt appears at reduced risk for aspiration  following general aspiration precautions. However, pt does have challenging factors that could impact oropharyngeal swallowing to include new CVA w/ RUE weakness and slow LUE for self-feeding, deconditioning/weakness, and apparent cognitive-linguistic deficits requiring cues, support, and Supervision at meals s/p recent change in status. These factors can increase risk for aspiration, dysphagia as well as decreased oral intake overall.  During po trials, pt consumed all consistencies w/ no overt coughing, decline in vocal quality, or change in respiratory presentation during/post trials. O2 sats remained 98-99% when checked. Oral phase appeared grossly [place] w/ timely bolus management, mastication, and control of bolus propulsion for A-P transfer for swallowing. Oral clearing achieved w/ all trial consistencies -- moistened, soft foods given.  OM Exam appeared grossly [facility] w/ no unilateral lingual/oral weakness noted. Needed cues to support following through w/ OM tasks. Speech Clear. Pt fed self w/ setup and full support.   Recommend a more Mech Soft consistency diet w/ well-Cut meats, moistened foods; Thin liquids -- carefully monitor straw use IF using(not recommended currently); pt should Hold Cup when drinking. Recommend general aspiration precautions, Supervision and support at meals. Reduce distractions/talking at meals. Feeding assistance as needed. Monitor for any impulsivity w/ oral intake. Pills WHOLE in Puree for safer, easier swallowing.  Thorough Education given on Pills in Puree; food consistencies and easy to eat options; general aspiration precautions and supporting pt at meals to pt and Wife/Dtr. Pt appears close to/at his suspected swallowing baseline but will need ST f/u for cognitive-linguistic assessment to address language issues at Discharge -- family is considering CIR. MD/NSG to reconsult if any new swallowing needs arise. NSG updated, agreed. MD updated. Recommend Dietician f/u for  support. SLP Visit Diagnosis: Dysphagia, unspecified (R13.10) (appears close to/at his suspected swallowing baseline)    Aspiration Risk   (reduced following general precautions)    Diet Recommendation   Thin;Dysphagia 3 (mechanical soft) = a more Mech Soft consistency diet w/ well-Cut meats, moistened foods; Thin liquids -- carefully monitor straw use IF using(not recommended currently); pt should Hold Cup when drinking. Recommend general aspiration precautions, Supervision and support at meals. Reduce distractions/talking at meals. Feeding assistance as needed. Monitor for any impulsivity w/ oral intake.   Medication Administration: Whole meds with puree (vs need to Crush in puree)    Other  Recommendations Recommended Consults:  (Dietician f/u) Oral Care Recommendations: Oral care BID;Oral care before and after PO;Staff/trained caregiver to provide oral care (support pt)    Recommendations for follow up therapy are one component of a multi-disciplinary discharge planning process, led by the attending physician.  Recommendations may be updated based on patient status, additional functional criteria and insurance authorization.  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 male with medical history significant for COPD with emphysema, diabetes mellitus and hypertension being admitted with sepsis of unknown source after presenting with lethargy, chills, nausea and confusion.  Most of the history is given by the wife at the bedside  who stated that patient was in his usual state of health earlier in the day and they ran a lot of errands however on the arrival home at 6:30 PM, he started feeling cold and went to lie down.  He remained in bed which is unusual for him and at about 9:30 PM she heard him retching and when she went to turn to him he was confused so she decided to get him to the hospital.   Wife states he has had UTIs in the past and this is how he presents.  Of note patient was hospitalized in 2023 as well as 2019 with sepsis of unknown source presenting with fever and leukocytosis.  CT of Abd/CXR: Negative. Lower chest: No acute abnormality. Extensive multi-vessel coronary  artery calcification. Punctate metallic shrapnel noted within the  visualized anterior chest wall.   CT of Head: Punctate focus of cortical hypoattenuation in the posterior right  frontal lobe, potentially new from prior exam. This may represent a  small acute or subacute infarct.  2. Punctate focus of cortical hypoattenuation in the left parietal  lobe is also potentially new from prior exam.  3. Stable focal area of hypoattenuation involving the right parietal  cortex. This likely represents a subacute or chronic infarct.  Recommend MRI of the brain without contrast for further evaluation  of these lesions.  4. Stable generalized atrophy and white matter disease. This likely  reflects the sequela of chronic microvascular ischemia. Type of Study: Bedside Swallow Evaluation Previous Swallow Assessment: none Diet Prior to this Study: NPO Temperature Spikes Noted: No (wbc 11.9 trending down) Respiratory Status: Room air History of Recent Intubation: No Behavior/Cognition: Alert;Cooperative;Pleasant mood;Confused;Distractible;Requires cueing (potential language deficits) Oral Cavity Assessment: Dry (min) Oral Care Completed by SLP: Yes Oral Cavity - Dentition: Adequate natural dentition Vision: Functional for self-feeding Self-Feeding Abilities: Able to  feed self;Needs assist;Needs set up;Total assist (unable to use RUE; LUE slow to grip) Patient Positioning: Upright in bed (needed full upright positioning support) Baseline Vocal Quality: Normal Volitional Cough: Cognitively unable to elicit Volitional Swallow: Unable to elicit    Oral/Motor/Sensory Function Overall Oral Motor/Sensory Function: Within functional limits (Fair-Good; some inconsistency w/ follow through w/ OM tasks during assessment)   Ice Chips Ice chips: Within functional limits Presentation: Spoon (fed; 3 trials)   Thin Liquid Thin Liquid: Within functional limits Presentation: Cup;Self Fed (12+ trials)    Nectar Thick Nectar Thick Liquid: Not tested   Honey Thick Honey Thick Liquid: Not tested   Puree Puree: Within functional limits Presentation: Spoon (fed; 10 trials)   Solid     Solid: Within functional limits Presentation: Spoon (fed; 6 trials)        Jerilynn Som, MS, CCC-SLP Speech Language Pathologist Rehab Services; [facility] [phone] (ascom) Teaira Croft 08/04/2023,3:47 PM
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
## 2023-08-04 NOTE — Progress Notes (Signed)
[place] PHYSICAL MEDICINE AND REHABILITATION  CONSULT SERVICE NOTE   Pt discussed with rehab admissions coordinator. Chart has been reviewed. This is a 82 yo male admitted with fever/chills and was found to be septic. HCT revealed old strokes, but pt developed weakness in his right arm and a follow up CT revealed a hypodensity in the right frontal lobe. Neurology recommended restarting ASA and adding plavix. MRI could not be performed d/t he has metal in body. Pt was up with PT and OT on 11/27 and was min to mod assist for transfers and took a few lateral steps next to bed with min assist.  Pt lives with spouse in one level house with 1-2 steps to enter. He was independent and driving PTA.     Assessment: Likely acute to subacute infarct in the posterior right frontal lobe Debility related to SIRS/sepsis.   Plan:  This patient would benefit from acute inpatient rehab to address functional mobility and self-care. Additionally, the patient requires daily MD oversight of the active medical issues noted above. Dispo and social supports are appropriate. ELOS 12-18 days with supervision goals.    Rehab Admissions Coordinator to follow up.    Ranelle Oyster, MD, [facility] Physical Medicine & Rehabilitation Medical Director Rehabilitation Services 08/04/2023

## 2023-08-04 NOTE — Progress Notes (Signed)
PROGRESS NOTE    Jeremy Valencia  UJW:[id number] DOB: [birth year] DOA: 08/01/2023 PCP: Lynnea Ferrier, MD   Assessment & Plan:   Principal Problem:   Sepsis [facility]) Active Problems:   Acute metabolic encephalopathy   Essential (primary) hypertension   Type 2 diabetes mellitus (HCC)   Chronic obstructive pulmonary disease (COPD) (HCC)  Assessment and Plan: CVA: repeat CVA shows punctate focus in posterior right frontal lobe, may represent a small acute or subacute infarct, punctate focus in the left parietal lobe. Continue on aspirin, plavix & statin as per neuro. Echo ordered. Repeat CT tomorrow as per neuro. Pt has metal in his body so unable to get an MRI.  PT/OT recs acute rehab. Speech consulted but not seen pt yet   SIRS & likely sepsis: met criteria w/ fever, tachycardia, leukocytosis but no source. CXR was normal. Blood cxs NGTD. UA was unremarkable. CT head shows no acute intracranial abnormalities. Pt cannot have an MRI as pt has metal in his body. Continue on IV doxy, rocephin.    Acute metabolic encephalopathy: likely secondary to CVA. Re-orient prn    HTN: will restart home dose of lisinopril, hydralazine when able to swallow safely   DM2: fair control, HbA1c 7.3. Continue on SSI w/ accuchecks    COPD: w/o exacerbation. Bronchodilators prn       DVT prophylaxis: lovenox  Code Status: full  Family Communication: discussed pt's care w/ pt's family and answered their questions  Disposition Plan: likely d/c to acute rehab   Level of care: Telemetry Medical  Status is: Inpatient Remains inpatient appropriate because: CVA    Consultants:  Neuro   Procedures:   Antimicrobials: ceftriaxone, doxy    Subjective: Pt c/o fatigue   Objective: Vitals:   08/03/23 1522 08/03/23 2022 08/04/23 0401 08/04/23 0750  BP: 133/74 (!) 152/77 (!) 143/67 (!) 143/83  Pulse: 93 96 88 86  Resp: 20 20 16 16   Temp: 98.6 F (37 C) 97.7 F (36.5 C) 97.7 F (36.5 C) 98.2 F  (36.8 C)  TempSrc: Oral     SpO2: 94% 95% 95% 98%  Weight:      Height:        Intake/Output Summary (Last 24 hours) at 08/04/2023 0814 Last data filed at 08/04/2023 0600 Gross per 24 hour  Intake 985.91 ml  Output 1100 ml  Net -114.09 ml   Filed Weights   08/02/23 0336  Weight: 94.2 kg    Examination:  General exam: appears calm but uncomfortable   Respiratory system: clear breath sounds b/l  Cardiovascular system: S1 & S2+. No rubs or clicks   Gastrointestinal system: abd is soft, NT, ND & hypoactive bowel sounds  Central nervous system: awake & alert.  Psychiatry: judgement and insight appears poor. Flat mood and affect    Data Reviewed: I have personally reviewed following labs and imaging studies  CBC: Recent Labs  Lab 08/01/23 2209 08/02/23 0430 08/03/23 0304 08/04/23 0536  WBC 13.7* 13.8* 12.6* 11.9*  NEUTROABS 10.7*  --   --   --   HGB 14.2 14.9 14.5 14.9  HCT 41.8 43.3 41.6 42.5  MCV 98.6 95.4 96.3 95.5  PLT 195 206 207 212   Basic Metabolic Panel: Recent Labs  Lab 08/01/23 2209 08/02/23 0430 08/03/23 0304 08/04/23 0536  NA 132* 134* 137 136  K 3.8 3.8 3.8 3.5  CL 103 103 103 103  CO2 22 24 23 22   GLUCOSE 142* 166* 84 136*  BUN  27* 26* 24* 23  CREATININE 1.05 1.04 0.90 0.92  CALCIUM 8.0* 8.2* 8.2* 8.1*   GFR: Estimated Creatinine Clearance: 67.5 mL/min (by C-G formula based on SCr of 0.92 mg/dL). Liver Function Tests: Recent Labs  Lab 08/01/23 2209 08/02/23 0430  AST 14* 17  ALT 15 18  ALKPHOS 42 43  BILITOT 0.9 1.4*  PROT 6.2* 6.7  ALBUMIN 3.1* 3.4*   No results for input(s): "LIPASE", "AMYLASE" in the last 168 hours. No results for input(s): "AMMONIA" in the last 168 hours. Coagulation Profile: Recent Labs  Lab 08/01/23 2209 08/03/23 0021  INR 1.2 1.1   Cardiac Enzymes: No results for input(s): "CKTOTAL", "CKMB", "CKMBINDEX", "TROPONINI" in the last 168 hours. BNP (last 3 results) No results for input(s): "PROBNP" in  the last 8760 hours. HbA1C: Recent Labs    08/01/23 2209  HGBA1C 7.3*   CBG: Recent Labs  Lab 08/03/23 1554 08/03/23 2133 08/03/23 2351 08/04/23 0415 08/04/23 0753  GLUCAP 109* 124* 123* 147* 115*   Lipid Profile: No results for input(s): "CHOL", "HDL", "LDLCALC", "TRIG", "CHOLHDL", "LDLDIRECT" in the last 72 hours. Thyroid Function Tests: No results for input(s): "TSH", "T4TOTAL", "FREET4", "T3FREE", "THYROIDAB" in the last 72 hours. Anemia Panel: No results for input(s): "VITAMINB12", "FOLATE", "FERRITIN", "TIBC", "IRON", "RETICCTPCT" in the last 72 hours. Sepsis Labs: Recent Labs  Lab 08/01/23 2209 08/02/23 0430  PROCALCITON  --  <0.10  LATICACIDVEN 2.4* 0.7    Recent Results (from the past 240 hour(s))  Resp panel by RT-PCR (RSV, Flu A&B, Covid) Anterior Nasal Swab     Status: None   Collection Time: 08/01/23 10:09 PM   Specimen: Anterior Nasal Swab  Result Value Ref Range Status   SARS Coronavirus 2 by RT PCR NEGATIVE NEGATIVE Final    Comment: (NOTE) SARS-CoV-2 target nucleic acids are NOT DETECTED.  The SARS-CoV-2 RNA is generally detectable in upper respiratory specimens during the acute phase of infection. The lowest concentration of SARS-CoV-2 viral copies this assay can detect is 138 copies/mL. A negative result does not preclude SARS-Cov-2 infection and should not be used as the sole basis for treatment or other patient management decisions. A negative result may occur with  improper specimen collection/handling, submission of specimen other than nasopharyngeal swab, presence of viral mutation(s) within the areas targeted by this assay, and inadequate number of viral copies(<138 copies/mL). A negative result must be combined with clinical observations, patient history, and epidemiological information. The expected result is Negative.  Fact Sheet for Patients:  BloggerCourse.com  Fact Sheet for Healthcare Providers:   SeriousBroker.it  This test is no t yet approved or cleared by the [place] FDA and  has been authorized for detection and/or diagnosis of SARS-CoV-2 by FDA under an Emergency Use Authorization (EUA). This EUA will remain  in effect (meaning this test can be used) for the duration of the COVID-19 declaration under Section 564(b)(1) of the Act, 21 U.S.C.section 360bbb-3(b)(1), unless the authorization is terminated  or revoked sooner.       Influenza A by PCR NEGATIVE NEGATIVE Final   Influenza B by PCR NEGATIVE NEGATIVE Final    Comment: (NOTE) The Xpert Xpress SARS-CoV-2/FLU/RSV plus assay is intended as an aid in the diagnosis of influenza from Nasopharyngeal swab specimens and should not be used as a sole basis for treatment. Nasal washings and aspirates are unacceptable for Xpert Xpress SARS-CoV-2/FLU/RSV testing.  Fact Sheet for Patients: BloggerCourse.com  Fact Sheet for Healthcare Providers: SeriousBroker.it  This test is not yet  approved or cleared by the [place] and has been authorized for detection and/or diagnosis of SARS-CoV-2 by FDA under an Emergency Use Authorization (EUA). This EUA will remain in effect (meaning this test can be used) for the duration of the COVID-19 declaration under Section 564(b)(1) of the Act, 21 U.S.C. section 360bbb-3(b)(1), unless the authorization is terminated or revoked.     Resp Syncytial Virus by PCR NEGATIVE NEGATIVE Final    Comment: (NOTE) Fact Sheet for Patients: BloggerCourse.com  Fact Sheet for Healthcare Providers: SeriousBroker.it  This test is not yet approved or cleared by the [place] FDA and has been authorized for detection and/or diagnosis of SARS-CoV-2 by FDA under an Emergency Use Authorization (EUA). This EUA will remain in effect (meaning this test can be used) for  the duration of the COVID-19 declaration under Section 564(b)(1) of the Act, 21 U.S.C. section 360bbb-3(b)(1), unless the authorization is terminated or revoked.  Performed at [facility], [address] [place], [place] [address]   Blood Culture (routine x 2)     Status: None (Preliminary result)   Collection Time: 08/01/23 10:09 PM   Specimen: BLOOD  Result Value Ref Range Status   Specimen Description BLOOD RIGHT ARM  Final   Special Requests   Final    BOTTLES DRAWN AEROBIC AND ANAEROBIC Blood Culture adequate volume   Culture   Final    NO GROWTH 3 DAYS Performed at [facility], [address], [place], [place] [address]    Report Status PENDING  Incomplete  Blood Culture (routine x 2)     Status: None (Preliminary result)   Collection Time: 08/01/23 10:09 PM   Specimen: BLOOD  Result Value Ref Range Status   Specimen Description BLOOD LEFT WRIST  Final   Special Requests   Final    BOTTLES DRAWN AEROBIC AND ANAEROBIC Blood Culture adequate volume   Culture   Final    NO GROWTH 3 DAYS Performed at [facility], [address] [place], [place] [address]    Report Status PENDING  Incomplete         Radiology Studies: CT HEAD WO CONTRAST ( )  Result Date: 08/03/2023 CLINICAL DATA:  Neuro deficit, acute, stroke suspected. Disoriented. Unable to move right arm. EXAM: CT HEAD WITHOUT CONTRAST TECHNIQUE: Contiguous axial images were obtained from the base of the skull through the vertex without intravenous contrast. RADIATION DOSE REDUCTION: This exam was performed according to the departmental dose-optimization program which includes automated exposure control, adjustment of the mA and/or kV according to patient size and/or use of iterative reconstruction technique. COMPARISON:  CT head without contrast 08/02/2023 FINDINGS: Brain: Focal area of hypoattenuation involving the right parietal cortex is again noted. Punctate focus cortical  hypoattenuation is present in the posterior right frontal lobe, potentially new from prior exam. A punctate focus of cortical hypoattenuation is present in the left parietal lobe on image 25 series 2. No acute hemorrhage or mass lesion is present. Generalized atrophy and white matter disease is stable. Deep brain nuclei are within normal limits. The brainstem and cerebellum are within normal limits. Midline structures are within normal limits. Vascular: Atherosclerotic calcifications are present within the cavernous internal carotid arteries bilaterally. No hyperdense vessel is present. Skull: Calvarium is intact. No focal lytic or blastic lesions are present. No significant extracranial soft tissue lesion is present. Sinuses/Orbits: 2 punctate metallic foci are again noted in the posterior right orbit. 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 Left globe  and orbit are normal. The paranasal sinuses and mastoid air cells are clear. IMPRESSION: 1. Punctate focus of cortical hypoattenuation in the posterior right frontal lobe, potentially new from prior exam. This may represent a small acute or subacute infarct. 2. Punctate focus of cortical hypoattenuation in the left parietal lobe is also potentially new from prior exam. 3. Stable focal area of hypoattenuation involving the right parietal cortex. This likely represents a subacute or chronic infarct. Recommend MRI of the brain without contrast for further evaluation of these lesions. 4. Stable generalized atrophy and white matter disease. This likely reflects the sequela of chronic microvascular ischemia. These results will be called to the ordering clinician or representative by the Radiologist Assistant, and communication documented in the PACS or [employer]. Electronically Signed   By: Marin Roberts M.D.   On: 08/03/2023 12:05        Scheduled Meds:  aspirin EC  81 mg Oral Daily   atorvastatin  20 mg Oral Daily   clopidogrel  75 mg Oral Daily    enoxaparin (LOVENOX) injection  40 mg Subcutaneous Q24H   feeding supplement  237 mL Oral BID BM   insulin aspart  0-6 Units Subcutaneous Q4H   Continuous Infusions:  cefTRIAXone (ROCEPHIN)  IV 2 g (08/03/23 1913)   dextrose 5 % and 0.9 % NaCl 50 mL/hr at 08/03/23 1515   doxycycline (VIBRAMYCIN) IV 100 mg (08/04/23 0327)     LOS: 2 days       Charise Killian, MD [facility] Pager [phone]  If 7PM-7AM, please contact night-coverage [email or web address] 08/04/2023, 8:14 AM

## 2023-08-04 NOTE — Progress Notes (Signed)
Physical Therapy Treatment Patient Details Name: Jeremy Valencia MRN: [id number] DOB: May 27, 1941 Today's Date: 08/04/2023   History of Present Illness Pt is an 82 year old male admitted with sepsis, AMS    PMH significant for COPD with emphysema, diabetes mellitus and hypertension being admitted with sepsis    PT Comments  Pt received in bed lethargic and tired. As per nurse pt had multiple Imaging test today to find the source of the infection. Pt stated: I am very tired." Agreeable to exercises in bed. Pt participated in AROM to BLE 10 reps each. Pt deferred form bed mobility and OOB activities due to level of fatigue. Pt will benefit from continued PT in acute. Current POC remains appropriate.    If plan is discharge home, recommend the following: A lot of help with walking and/or transfers;A lot of help with bathing/dressing/bathroom;Assistance with cooking/housework;Direct supervision/assist for financial management;Direct supervision/assist for medications management;Assist for transportation;Help with stairs or ramp for entrance;Supervision due to cognitive status   Can travel by private vehicle        Equipment Recommendations  Other (comment)    Recommendations for Other Services Rehab consult     Precautions / Restrictions Precautions Precautions: Fall Restrictions Weight Bearing Restrictions: No     Mobility  Bed Mobility Overal bed mobility: Needs Assistance             General bed mobility comments: Pt refused    Transfers                   General transfer comment: pt refused    Ambulation/Gait               General Gait Details: Pt deferred   Stairs             Wheelchair Mobility     Tilt Bed    Modified Rankin (Stroke Patients Only)       Balance                                            Cognition Arousal: Lethargic Behavior During Therapy: Flat affect Overall Cognitive Status:  Impaired/Different from baseline Area of Impairment: Orientation, Attention, Memory, Following commands, Safety/judgement, Awareness                 Orientation Level: Disoriented to, Place, Time, Situation Current Attention Level: Focused Memory: Decreased short-term memory Following Commands: Follows one step commands inconsistently Safety/Judgement: Decreased awareness of safety, Decreased awareness of deficits Awareness: Intellectual            Exercises General Exercises - Lower Extremity Ankle Circles/Pumps: AROM, Both, 10 reps Gluteal Sets: AROM, Both, 10 reps, Supine Heel Slides: AROM, 10 reps, Supine Straight Leg Raises: AROM, 10 reps, Both, Supine    General Comments        Pertinent Vitals/Pain Pain Assessment Pain Assessment: No/denies pain    Home Living                          Prior Function            PT Goals (current goals can now be found in the care plan section) Acute Rehab PT Goals Patient Stated Goal: did not state PT Goal Formulation: With patient Time For Goal Achievement: 08/16/23 Potential to Achieve Goals: Fair Progress towards PT goals: Progressing toward  goals    Frequency    Min 1X/week      PT Plan      Co-evaluation              AM-PAC PT "6 Clicks" Mobility   Outcome Measure  Help needed turning from your back to your side while in a flat bed without using bedrails?: A Lot Help needed moving from lying on your back to sitting on the side of a flat bed without using bedrails?: A Lot Help needed moving to and from a bed to a chair (including a wheelchair)?: A Lot Help needed standing up from a chair using your arms (e.g., wheelchair or bedside chair)?: Total Help needed to walk in hospital room?: Total Help needed climbing 3-5 steps with a railing? : Total 6 Click Score: 9    End of Session   Activity Tolerance: Patient limited by lethargy Patient left: in bed;with call bell/phone within  reach;with family/visitor present Nurse Communication: Mobility status PT Visit Diagnosis: Unsteadiness on feet (R26.81);Muscle weakness (generalized) (M62.81);Difficulty in walking, not elsewhere classified (R26.2);Other abnormalities of gait and mobility (R26.89)     Time: 0981-1914 PT Time Calculation (min) (ACUTE ONLY): 16 min  Charges:    $Therapeutic Exercise: 8-22 mins PT General Charges $$ ACUTE PT VISIT: 1 Visit                     Janet Berlin PT DPT 5:07 PM,08/04/23

## 2023-08-04 NOTE — Progress Notes (Signed)
  Inpatient Rehabilitation Admissions Coordinator   I spoke with patient's wife and daughter by phone for rehab assessment. We discussed goals and expectations of a possible CIR admit. They prefer CIR for rehab. They have asked to speak to Neurologist  and I have notified Dr Otelia Limes of their request.  Family can provide expected caregiver support that is recommended. [employer] is closed today for the holidays.  I will begin insurance Auth with [facility] Medicare on Monday, once we have additional therapy assessments as well as Medical updates. Please call me with any questions.   Ottie Glazier, RN, MSN Rehab Admissions Coordinator [phone]

## 2023-08-04 NOTE — TOC Initial Note (Signed)
Transition of Care [facility]) - Initial/Assessment Note    Patient Details  Name: Jeremy Valencia MRN: [id number] Date of Birth: 1941-08-15  [facility] CM/SW Contact:    Jeremy Liner, LCSW Phone Number: 08/04/2023, 4:23 PM  Clinical Narrative:  Patient not oriented. Wife, daughter, and son at bedside. CSW introduced role and explained that therapy recommendations would be discussed. Discussed potential barriers to CIR. Wife is agreeable to SNF backup referral. Provided CMS scores for facilities within 25 miles of their zip code. No further concerns. CSW encouraged patient's family to contact CSW as needed. CSW will continue to follow patient and his family for support and facilitate discharge once medically stable.                Expected Discharge Plan: IP Rehab Facility Barriers to Discharge: Continued Medical Work up   Patient Goals and CMS Choice   CMS Medicare.gov Compare Post Acute Care list provided to:: Patient Represenative (must comment) (Wife)        Expected Discharge Plan and Services     Post Acute Care Choice: IP Rehab Living arrangements for the past 2 months: Single Family Home                                      Prior Living Arrangements/Services Living arrangements for the past 2 months: Single Family Home Lives with:: Spouse Patient language and need for interpreter reviewed:: Yes Do you feel safe going back to the place where you live?: Yes      Need for Family Participation in Patient Care: Yes (Comment) Care giver support system in place?: Yes (comment)   Criminal Activity/Legal Involvement Pertinent to Current Situation/Hospitalization: No - Comment as needed  Activities of Daily Living      Permission Sought/Granted Permission sought to share information with : Facility Medical sales representative, Family Supports    Share Information with NAME: Jeremy Valencia  Permission granted to share info w AGENCY: SNF's  Permission granted  to share info w Relationship: Wife  Permission granted to share info w Contact Information: [phone]  Emotional Assessment Appearance:: Appears stated age Attitude/Demeanor/Rapport: Unable to Assess Affect (typically observed): Unable to Assess Orientation: :  (Disoriented x 4) Alcohol / Substance Use: Not Applicable Psych Involvement: No (comment)  Admission diagnosis:  Sepsis (HCC) [A41.9] Altered mental status, unspecified altered mental status type [R41.82] Patient Active Problem List   Diagnosis Date Noted   Sepsis (HCC) 08/02/2023   Acute metabolic encephalopathy 08/02/2023   Chronic obstructive pulmonary disease (COPD) (HCC) 08/02/2023   Overweight (BMI 25.0-29.9) 11/16/2021   Glaucoma 11/16/2021   Hyperlipidemia 11/16/2021   Constipation 11/16/2021   General weakness 11/15/2021   Acute encephalopathy 11/15/2021   Other emphysema (HCC) 01/22/2018   Benign essential tremor 01/18/2018   Insomnia, 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/18/2018   Tremor of hands and face 01/18/2018   Fever 10/26/2017   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   Persistent proteinuria 01/18/2016   Senile purpura (HCC) 01/18/2016   Type 2 diabetes mellitus (HCC) 06/19/2015   Microscopic hematuria 06/19/2015   Enlarged prostate 06/19/2015   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 [place] [address] Phone: [phone] Fax: [phone]     Social Determinants of Health (SDOH) Social History: SDOH Screenings   Food  Insecurity: Patient Unable To Answer (08/03/2023)  Housing: Patient Unable To Answer (08/03/2023)  Transportation Needs: Patient Unable To Answer (08/03/2023)  Utilities: Patient Unable To Answer (08/03/2023)  Tobacco Use: Medium Risk (08/02/2023)   SDOH Interventions:     Readmission Risk Interventions     No data to display

## 2023-08-04 NOTE — [place] FL2 (Signed)
[place] MEDICAID FL2 LEVEL OF CARE FORM     IDENTIFICATION  Patient Name: Jeremy Valencia Birthdate: 08-06-1941 Sex: male Admission Date (Current Location): 08/01/2023  [place] and [place] Number:  Chiropodist and Address:  [facility], [address] [place], [place] [address]      Provider Number: [id number]  Attending Physician Name and Address:  Charise Killian, MD  Relative Name and Phone Number:       Current Level of Care: Hospital Recommended Level of Care: Skilled Nursing Facility Prior Approval Number:    Date Approved/Denied:   PASRR Number: Unable to access on [place] Must. Will have to call on Monday. Information doesn't match.  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   Persistent proteinuria 01/18/2016   Senile purpura (HCC) 01/18/2016   Type 2 diabetes mellitus (HCC) 06/19/2015   Microscopic hematuria 06/19/2015   Enlarged prostate 06/19/2015   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   Diabetes mellitus, type 2 (HCC) 03/13/2015   DD (diverticular disease) 03/13/2015   Acid reflux 03/13/2015   Chronic low back pain 01/08/2015   Essential (primary) hypertension 07/18/2014   Enthesopathy of ankle and tarsus 07/17/2014   Dupuytren's contracture of foot 07/17/2014   Capsulitis of right foot    Plantar fasciitis    Bursitis  05/16/2013    Orientation RESPIRATION BLADDER Height & Weight      (Disoriented x 4)  Normal External catheter Weight: 207 lb 10.8 oz (94.2 kg) Height:  6\' 1"  (185.4 cm)  BEHAVIORAL SYMPTOMS/MOOD NEUROLOGICAL BOWEL NUTRITION STATUS   (None)  (None) Incontinent Diet (DYS 3. Extra Gravy on meats, potatoes. Pt may have baked and sweet potatoes per Speech ok; NO Straws. Pt may have more finger foods including chicken tenders, crackers per Speech ok.)  AMBULATORY STATUS COMMUNICATION OF NEEDS Skin   Extensive Assist Non-Verbally Normal                       Personal Care Assistance Level of Assistance  Bathing, Feeding, Dressing Bathing Assistance: Maximum assistance Feeding assistance: Limited assistance Dressing Assistance: Maximum assistance     Functional Limitations Info  Sight, Hearing, Speech Sight Info: Adequate Hearing Info: Adequate Speech Info: Impaired (Nods/Gestures appropriately)    SPECIAL CARE FACTORS FREQUENCY  PT (By licensed PT), OT (By licensed OT)     PT Frequency: 5 x week OT Frequency: 5 x week            Contractures Contractures Info: Not present    Additional Factors Info  Code Status, Allergies Code Status Info: Full code Allergies Info: Metformin, Rofecoxib, Lamisil (Terbinafine).           Current Medications (08/04/2023):  This is the current hospital active medication list Current Facility-Administered Medications  Medication Dose Route Frequency Provider Last Rate Last Admin   acetaminophen (TYLENOL) tablet 650 mg  650 mg Oral Q6H PRN  Andris Baumann, MD   650 mg at 08/04/23 1223   Or   acetaminophen (TYLENOL) suppository 650 mg  650 mg Rectal Q6H PRN Andris Baumann, MD       albuterol (PROVENTIL) (2.5 MG/3ML) 0.083% nebulizer solution 2.5 mg  2.5 mg Nebulization Q6H PRN Otelia Sergeant, RPH       aspirin EC tablet 81 mg  81 mg Oral Daily Caryl Pina, MD   81 mg at 08/04/23 1224   atorvastatin (LIPITOR) tablet 20 mg  20 mg  Oral Daily Charise Killian, MD   20 mg at 08/04/23 1223   cefTRIAXone (ROCEPHIN) 2 g in sodium chloride 0.9 % 100 mL IVPB  2 g Intravenous Q24H Charise Killian, MD 200 mL/hr at 08/03/23 1913 2 g at 08/03/23 1913   clopidogrel (PLAVIX) tablet 75 mg  75 mg Oral Daily Caryl Pina, MD   75 mg at 08/04/23 1224   diclofenac Sodium (VOLTAREN) 1 % topical gel 2 g  2 g Topical TID PRN Manuela Schwartz, NP   2 g at 08/04/23 0322   doxycycline (VIBRAMYCIN) 100 mg in dextrose 5 % 250 mL IVPB  100 mg Intravenous Q12H Charise Killian, MD 125 mL/hr at 08/04/23 0327 100 mg at 08/04/23 0327   enoxaparin (LOVENOX) injection 40 mg  40 mg Subcutaneous Q24H Charise Killian, MD   40 mg at 08/03/23 2334   feeding supplement (ENSURE ENLIVE / ENSURE PLUS) liquid 237 mL  237 mL Oral BID BM Charise Killian, MD   237 mL at 08/04/23 1409   [START ON 08/05/2023] hydrALAZINE (APRESOLINE) tablet 25 mg  25 mg Oral BID Charise Killian, MD       insulin aspart (novoLOG) injection 0-6 Units  0-6 Units Subcutaneous Q4H Charise Killian, MD       [START ON 08/05/2023] lisinopril (ZESTRIL) tablet 20 mg  20 mg Oral Daily Charise Killian, MD       ondansetron [facility]) tablet 4 mg  4 mg Oral Q6H PRN Andris Baumann, MD       Or   ondansetron [facility]) injection 4 mg  4 mg Intravenous Q6H PRN Andris Baumann, MD         Discharge Medications: Please see discharge summary for a list of discharge medications.  Relevant Imaging Results:  Relevant Lab Results:   Additional Information SS#: [id number]  Margarito Liner, LCSW

## 2023-08-04 NOTE — Progress Notes (Signed)
OT Cancellation Note  Patient Details Name: Jeremy Valencia MRN: [id number] DOB: [birth year]   Cancelled Treatment:    Reason Eval/Treat Not Completed: Other (comment) (pt is OTF for testing, spoke with daugther and wife in room at attempt regarding future therapy goals; OT will reattempt as able.Oleta Mouse, OTD OTR/L  08/04/23, 1:41 PM

## 2023-08-04 NOTE — Plan of Care (Signed)

## 2023-08-05 ENCOUNTER — Inpatient Hospital Stay: Payer: Medicare HMO

## 2023-08-05 ENCOUNTER — Inpatient Hospital Stay (HOSPITAL_COMMUNITY)
Admit: 2023-08-05 | Discharge: 2023-08-05 | Disposition: A | Discharge status: Home / Self Care | Payer: Medicare HMO | Attending: Internal Medicine | Admitting: Internal Medicine

## 2023-08-05 DIAGNOSIS — I6389 Other cerebral infarction: Secondary | ICD-10-CM | POA: Diagnosis not present

## 2023-08-05 DIAGNOSIS — I639 Cerebral infarction, unspecified: Secondary | ICD-10-CM | POA: Diagnosis not present

## 2023-08-05 DIAGNOSIS — R4702 Dysphasia: Secondary | ICD-10-CM | POA: Diagnosis not present

## 2023-08-05 DIAGNOSIS — R4182 Altered mental status, unspecified: Secondary | ICD-10-CM | POA: Diagnosis not present

## 2023-08-05 LAB — CBC
HCT: 43.1 % (ref 39.0–52.0)
Hemoglobin: 15.2 g/dL (ref 13.0–17.0)
MCH: 33.4 pg (ref 26.0–34.0)
MCHC: 35.3 g/dL (ref 30.0–36.0)
MCV: 94.7 fL (ref 80.0–100.0)
Platelets: 234 10*3/uL (ref 150–400)
RBC: 4.55 MIL/uL (ref 4.22–5.81)
RDW: 11.7 % (ref 11.5–15.5)
WBC: 12 10*3/uL — ABNORMAL HIGH (ref 4.0–10.5)
nRBC: 0 % (ref 0.0–0.2)

## 2023-08-05 LAB — GLUCOSE, CAPILLARY
Glucose-Capillary: 119 mg/dL — ABNORMAL HIGH (ref 70–99)
Glucose-Capillary: 130 mg/dL — ABNORMAL HIGH (ref 70–99)
Glucose-Capillary: 167 mg/dL — ABNORMAL HIGH (ref 70–99)
Glucose-Capillary: 171 mg/dL — ABNORMAL HIGH (ref 70–99)
Glucose-Capillary: 272 mg/dL — ABNORMAL HIGH (ref 70–99)
Glucose-Capillary: 278 mg/dL — ABNORMAL HIGH (ref 70–99)

## 2023-08-05 LAB — BASIC METABOLIC PANEL
Anion gap: 12 (ref 5–15)
BUN: 21 mg/dL (ref 8–23)
CO2: 21 mmol/L — ABNORMAL LOW (ref 22–32)
Calcium: 8.2 mg/dL — ABNORMAL LOW (ref 8.9–10.3)
Chloride: 102 mmol/L (ref 98–111)
Creatinine, Ser: 0.78 mg/dL (ref 0.61–1.24)
GFR, Estimated: >60 mL/min (ref >60)
Glucose, Bld: 126 mg/dL — ABNORMAL HIGH (ref 70–99)
Potassium: 3.4 mmol/L — ABNORMAL LOW (ref 3.5–5.1)
Sodium: 135 mmol/L (ref 135–145)

## 2023-08-05 MED ORDER — POTASSIUM CHLORIDE 20 MEQ PO PACK
20.0000 meq | PACK | Freq: Once | ORAL | Status: AC
  Administered 2023-08-05: 20 meq via ORAL
  Filled 2023-08-05: qty 1

## 2023-08-05 MED ORDER — DOXYCYCLINE HYCLATE 100 MG PO TABS
100.0000 mg | ORAL_TABLET | Freq: Two times a day (BID) | ORAL | Status: DC
  Administered 2023-08-05 – 2023-08-06 (×2): 100 mg via ORAL
  Filled 2023-08-05 (×2): qty 1

## 2023-08-05 MED ORDER — ADULT MULTIVITAMIN W/MINERALS CH
1.0000 | ORAL_TABLET | Freq: Every day | ORAL | Status: DC
  Administered 2023-08-06 – 2023-08-11 (×6): 1 via ORAL
  Filled 2023-08-05 (×6): qty 1

## 2023-08-05 MED ORDER — METOPROLOL TARTRATE 5 MG/5ML IV SOLN: 5.0000 mg | Freq: Four times a day (QID) | INTRAVENOUS | Status: DC | PRN

## 2023-08-05 MED ORDER — HYDROCORTISONE 1 % EX CREA
TOPICAL_CREAM | Freq: Two times a day (BID) | CUTANEOUS | Status: DC
  Administered 2023-08-07: 1 via TOPICAL
  Filled 2023-08-05 (×3): qty 28

## 2023-08-05 NOTE — Plan of Care (Signed)
  Problem: Coping: Goal: Ability to adjust to condition or change in health will improve Outcome: Progressing   Problem: Fluid Volume: Goal: Ability to maintain a balanced intake and output will improve Outcome: Progressing   Problem: Health Behavior/Discharge Planning: Goal: Ability to identify and utilize available resources and services will improve Outcome: Progressing   Problem: Metabolic: Goal: Ability to maintain appropriate glucose levels will improve Outcome: Progressing   Problem: Nutritional: Goal: Maintenance of adequate nutrition will improve Outcome: Progressing   

## 2023-08-05 NOTE — Progress Notes (Signed)
Echocardiogram 2D Echocardiogram has been performed.  Jeremy Valencia 08/05/2023, 5:22 PM

## 2023-08-05 NOTE — Progress Notes (Signed)
Occupational Therapy Treatment Patient Details Name: Jeremy Valencia MRN: [id number] DOB: Aug 26, 1941 Today's Date: 08/05/2023   History of present illness Pt is an 82 year old male admitted with sepsis, AMS    PMH significant for COPD with emphysema, diabetes mellitus and hypertension being admitted with sepsis   OT comments  Upon entering the room, pt supine in bed and agreeable to OT intervention. OT attempts PNF D1 while pt is flat in gravity eliminated position. However, pt is unable to attend to R UE without max multimodal cuing and hand over hand assistance. When asking pt to locate his R UE with L he was tapping pillow instead of R UE and needing cues to continue to scan to R side. Supine >sit with mod- max A of 2. Static sitting balance for ~ 5 minutes with close supervision. Pt stands with mod A of 2 from standard bed height with use of RW. Some movement illicits from R UE but not on command. Hand over hand to place R UE onto RW and pt takes several side steps towards the R with min A of 2. He returns to sit on EOB secondary to fatigue. Sit >supine with +2. Call bell and all needed items within reach. Daughter arrives to room at end of session.       If plan is discharge home, recommend the following:  A lot of help with walking and/or transfers;A lot of help with bathing/dressing/bathroom;Assistance with cooking/housework;Direct supervision/assist for medications management;Assist for transportation;Supervision due to cognitive status;Help with stairs or ramp for entrance;Direct supervision/assist for financial management;Assistance with feeding   Equipment Recommendations  Other (comment) (defer to next venue of care)       Precautions / Restrictions Precautions Precautions: Fall       Mobility Bed Mobility Overal bed mobility: Needs Assistance Bed Mobility: Supine to Sit, Sit to Supine     Supine to sit: Mod assist, +2 for physical assistance, +2 for safety/equipment Sit to  supine: Mod assist, +2 for physical assistance, +2 for safety/equipment        Transfers Overall transfer level: Needs assistance Equipment used: 2 person hand held assist Transfers: Sit to/from Stand Sit to Stand: +2 physical assistance, +2 safety/equipment, Min assist                 Balance Overall balance assessment: Needs assistance Sitting-balance support: Feet supported Sitting balance-Leahy Scale: Fair     Standing balance support: Bilateral upper extremity supported, During functional activity Standing balance-Leahy Scale: Fair                             ADL either performed or assessed with clinical judgement     Vision Patient Visual Report: No change from baseline            Cognition Arousal: Alert Behavior During Therapy: WFL for tasks assessed/performed Overall Cognitive Status: Impaired/Different from baseline Area of Impairment: Orientation, Attention, Memory, Following commands, Safety/judgement, Awareness                 Orientation Level: Disoriented to, Place, Time Current Attention Level: Focused Memory: Decreased short-term memory Following Commands: Follows one step commands inconsistently Safety/Judgement: Decreased awareness of safety, Decreased awareness of deficits Awareness: Intellectual                       Pertinent Vitals/ Pain       Pain Assessment Pain Assessment: No/denies  pain         Frequency  Min 1X/week        Progress Toward Goals  OT Goals(current goals can now be found in the care plan section)  Progress towards OT goals: Progressing toward goals      AM-PAC OT "6 Clicks" Daily Activity     Outcome Measure   Help from another person eating meals?: A Lot Help from another person taking care of personal grooming?: A Lot Help from another person toileting, which includes using toliet, bedpan, or urinal?: Total Help from another person bathing (including washing, rinsing,  drying)?: A Lot Help from another person to put on and taking off regular upper body clothing?: A Lot Help from another person to put on and taking off regular lower body clothing?: Total 6 Click Score: 10    End of Session Equipment Utilized During Treatment: Rolling walker (2 wheels)  OT Visit Diagnosis: Other abnormalities of gait and mobility (R26.89);Muscle weakness (generalized) (M62.81);Hemiplegia and hemiparesis Hemiplegia - Right/Left: Right Hemiplegia - dominant/non-dominant: Dominant   Activity Tolerance Patient tolerated treatment well   Patient Left in bed;with call bell/phone within reach;with family/visitor present   Nurse Communication Mobility status        Time: 6578-4696 OT Time Calculation (min): 29 min  Charges: OT General Charges $OT Visit: 1 Visit OT Treatments $Neuromuscular Re-education: 23-37 mins  Jackquline Denmark, MS, OTR/L , CBIS ascom [phone]  08/05/23, 12:47 PM

## 2023-08-05 NOTE — Progress Notes (Addendum)
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 metabolic encephalopathy in the setting of sepsis, with possible cefepime neurotoxicity as a contributing factor - improving since switching cefepime to ceftriaxone. Of note, CXR was normal. Blood cxs NGTD. UA was unremarkable and therefore infection is  felt to be unlikely as the etiology. Intermittent subclinical seizures now on the DDx given the brief episode of speech arrest noted during exam today (Saturday).  - Early subacute right frontal lobe stroke based on serial CT imaging: He has stroke risk factors of DM, HLD, HTN, advanced age and evidence for prior strokes on CT. DDx for underlying mechanism includes cardioembolic and atherosclerotic plaque rupture with distal  embolization.  - Exam findings of aphasia and moderate to severe RUE weakness on initial consult are now significantly improved. They were, suggestive of a possible acute left MCA stroke, but this has not been seen on serial CT scans. A small left thalamic stroke not visible on CT is now felt to be more likely as that would better explain his rapid improvement on serial exams. Unable to obtain MRI.  - Given multiple vascular territories of his old and additional possibly acute/subacute strokes seen on CT and inferred by exam, a cardioembolic etiology is suspected - Discussed the diagnostic and treatment plan with patient's wife today. All questions answered.    Recommendations: - EEG (ordered) - Continue DAPT therapy indefinitely with ASA and add Plavix.  - Continue atorvastatin - BP management  - Glycemic control - IVF as needed - SIRS/sepsis management per primary team. Per today's Hospitalist note, on IV doxy, rocephin and will d/c abx tomorrow  - Ultrasound of RUE was ordered by primary team today to r/o DVT. Wrist X-ray showed degenerative changes, but no fracture.  - PT/OT recommendations include acute rehab  - Speech therapy recommends dysphagia III diet - Cardiac imaging recommendations: - TTE has been ordered and is pending  - If TTE is negative, will need TEE due to suspected cardioembolic etiology of his strokes - If TEE is negative, then will need Zio patch to assess for possible paroxysmal a-fib - Cardiac telemetry  - Risk factor modification - Frequent neuro checks - NPO until passes stroke swallow screen   LOS: 3 days   @Electronically  signed: Dr. Caryl Pina 08/05/2023  2:44 PM

## 2023-08-05 NOTE — Progress Notes (Signed)
PROGRESS NOTE    Jeremy Valencia  OZD:[id number] DOB: 12-23-1940 DOA: 08/01/2023 PCP: Lynnea Ferrier, MD   Assessment & Plan:   Principal Problem:   Sepsis [facility]) Active Problems:   Acute metabolic encephalopathy   Essential (primary) hypertension   Type 2 diabetes mellitus (HCC)   Chronic obstructive pulmonary disease (COPD) (HCC)  Assessment and Plan: CVA: repeat CVA shows punctate focus in posterior right frontal lobe, may represent a small acute or subacute infarct, punctate focus in the left parietal lobe. Continue on aspirin, plavix, statin as per neuro. Echo ordered. Repeat CT head today as per neuro. Pt has metal in his body so unable to get an MRI.  PT/OT recs acute rehab. Continue on dysphagia III diet as per speech  RUE swelling: etiology unclear. [place] ordered to r/o DVT    SIRS & likely sepsis: met criteria w/ fever, tachycardia, leukocytosis but no source. CXR was normal. Blood cxs NGTD. UA was unremarkable. CT head shows no acute intracranial abnormalities. Pt cannot have an MRI as pt has metal in his body. Continue on IV doxy, rocephin and will d/c abx tomorrow. Sepsis r/o b/c no source found.    Acute metabolic encephalopathy: likely secondary to CVA. Re-orient prn   HTN: continue on home dose of lisinopril, hydralazine   DM2: fair control, HbA1c 7.3. Continue w/ SSI accuchecks    COPD: w/o exacerbation. Bronchodilators prn       DVT prophylaxis: lovenox  Code Status: full  Family Communication: discussed pt's care w/ pt's family and answered their questions  Disposition Plan: likely d/c to acute rehab   Level of care: Telemetry Medical  Status is: Inpatient Remains inpatient appropriate because: CVA    Consultants:  Neuro   Procedures:   Antimicrobials: ceftriaxone, doxy    Subjective: Pt c/o fatigue    Objective: Vitals:   08/04/23 1737 08/04/23 1939 08/05/23 0339 08/05/23 0808  BP: (!) 149/80 (!) 155/81 (!) 152/84 (!) 162/84  Pulse: 86  90 86 87  Resp:  18 18 16   Temp: 98.3 F (36.8 C) 98.2 F (36.8 C) 98.2 F (36.8 C) 98.2 F (36.8 C)  TempSrc:  Oral Oral Axillary  SpO2: 96% 95% 95% 95%  Weight:      Height:        Intake/Output Summary (Last 24 hours) at 08/05/2023 0826 Last data filed at 08/05/2023 0650 Gross per 24 hour  Intake 600 ml  Output 1350 ml  Net -750 ml   Filed Weights   08/02/23 0336  Weight: 94.2 kg    Examination:  General exam: appears uncomfortable  Respiratory system: clear breath sounds b/l  Cardiovascular system: S1/S2+. No rubs or clicks  Gastrointestinal system: abd is soft, NT, ND & hypoactive bowel sounds Central nervous system: alert & awake   Psychiatry: judgement and insight appears poor. Flat mood and affect    Data Reviewed: I have personally reviewed following labs and imaging studies  CBC: Recent Labs  Lab 08/01/23 2209 08/02/23 0430 08/03/23 0304 08/04/23 0536 08/05/23 0453  WBC 13.7* 13.8* 12.6* 11.9* 12.0*  NEUTROABS 10.7*  --   --   --   --   HGB 14.2 14.9 14.5 14.9 15.2  HCT 41.8 43.3 41.6 42.5 43.1  MCV 98.6 95.4 96.3 95.5 94.7  PLT 195 206 207 212 234   Basic Metabolic Panel: Recent Labs  Lab 08/01/23 2209 08/02/23 0430 08/03/23 0304 08/04/23 0536 08/05/23 0453  NA 132* 134* 137 136 135  K 3.8 3.8 3.8 3.5 3.4*  CL 103 103 103 103 102  CO2 22 24 23 22  21*  GLUCOSE 142* 166* 84 136* 126*  BUN 27* 26* 24* 23 21  CREATININE 1.05 1.04 0.90 0.92 0.78  CALCIUM 8.0* 8.2* 8.2* 8.1* 8.2*   GFR: Estimated Creatinine Clearance: 77.7 mL/min (by C-G formula based on SCr of 0.78 mg/dL). Liver Function Tests: Recent Labs  Lab 08/01/23 2209 08/02/23 0430  AST 14* 17  ALT 15 18  ALKPHOS 42 43  BILITOT 0.9 1.4*  PROT 6.2* 6.7  ALBUMIN 3.1* 3.4*   No results for input(s): "LIPASE", "AMYLASE" in the last 168 hours. No results for input(s): "AMMONIA" in the last 168 hours. Coagulation Profile: Recent Labs  Lab 08/01/23 2209 08/03/23 0021   INR 1.2 1.1   Cardiac Enzymes: No results for input(s): "CKTOTAL", "CKMB", "CKMBINDEX", "TROPONINI" in the last 168 hours. BNP (last 3 results) No results for input(s): "PROBNP" in the last 8760 hours. HbA1C: No results for input(s): "HGBA1C" in the last 72 hours.  CBG: Recent Labs  Lab 08/04/23 1556 08/04/23 1931 08/04/23 2345 08/05/23 0409 08/05/23 0811  GLUCAP 195* 133* 134* 119* 130*   Lipid Profile: No results for input(s): "CHOL", "HDL", "LDLCALC", "TRIG", "CHOLHDL", "LDLDIRECT" in the last 72 hours. Thyroid Function Tests: No results for input(s): "TSH", "T4TOTAL", "FREET4", "T3FREE", "THYROIDAB" in the last 72 hours. Anemia Panel: No results for input(s): "VITAMINB12", "FOLATE", "FERRITIN", "TIBC", "IRON", "RETICCTPCT" in the last 72 hours. Sepsis Labs: Recent Labs  Lab 08/01/23 2209 08/02/23 0430  PROCALCITON  --  <0.10  LATICACIDVEN 2.4* 0.7    Recent Results (from the past 240 hour(s))  Resp panel by RT-PCR (RSV, Flu A&B, Covid) Anterior Nasal Swab     Status: None   Collection Time: 08/01/23 10:09 PM   Specimen: Anterior Nasal Swab  Result Value Ref Range Status   SARS Coronavirus 2 by RT PCR NEGATIVE NEGATIVE Final    Comment: (NOTE) SARS-CoV-2 target nucleic acids are NOT DETECTED.  The SARS-CoV-2 RNA is generally detectable in upper respiratory specimens during the acute phase of infection. The lowest concentration of SARS-CoV-2 viral copies this assay can detect is 138 copies/mL. A negative result does not preclude SARS-Cov-2 infection and should not be used as the sole basis for treatment or other patient management decisions. A negative result may occur with  improper specimen collection/handling, submission of specimen other than nasopharyngeal swab, presence of viral mutation(s) within the areas targeted by this assay, and inadequate number of viral copies(<138 copies/mL). A negative result must be combined with clinical observations, patient  history, and epidemiological information. The expected result is Negative.  Fact Sheet for Patients:  BloggerCourse.com  Fact Sheet for Healthcare Providers:  SeriousBroker.it  This test is no t yet approved or cleared by the [place] FDA and  has been authorized for detection and/or diagnosis of SARS-CoV-2 by FDA under an Emergency Use Authorization (EUA). This EUA will remain  in effect (meaning this test can be used) for the duration of the COVID-19 declaration under Section 564(b)(1) of the Act, 21 U.S.C.section 360bbb-3(b)(1), unless the authorization is terminated  or revoked sooner.       Influenza A by PCR NEGATIVE NEGATIVE Final   Influenza B by PCR NEGATIVE NEGATIVE Final    Comment: (NOTE) The Xpert Xpress SARS-CoV-2/FLU/RSV plus assay is intended as an aid in the diagnosis of influenza from Nasopharyngeal swab specimens and should not be used as a sole basis for  treatment. Nasal washings and aspirates are unacceptable for Xpert Xpress SARS-CoV-2/FLU/RSV testing.  Fact Sheet for Patients: BloggerCourse.com  Fact Sheet for Healthcare Providers: SeriousBroker.it  This test is not yet approved or cleared by the [place] FDA and has been authorized for detection and/or diagnosis of SARS-CoV-2 by FDA under an Emergency Use Authorization (EUA). This EUA will remain in effect (meaning this test can be used) for the duration of the COVID-19 declaration under Section 564(b)(1) of the Act, 21 U.S.C. section 360bbb-3(b)(1), unless the authorization is terminated or revoked.     Resp Syncytial Virus by PCR NEGATIVE NEGATIVE Final    Comment: (NOTE) Fact Sheet for Patients: BloggerCourse.com  Fact Sheet for Healthcare Providers: SeriousBroker.it  This test is not yet approved or cleared by the [place] FDA  and has been authorized for detection and/or diagnosis of SARS-CoV-2 by FDA under an Emergency Use Authorization (EUA). This EUA will remain in effect (meaning this test can be used) for the duration of the COVID-19 declaration under Section 564(b)(1) of the Act, 21 U.S.C. section 360bbb-3(b)(1), unless the authorization is terminated or revoked.  Performed at [facility], [address] [place], [place] [address]   Blood Culture (routine x 2)     Status: None (Preliminary result)   Collection Time: 08/01/23 10:09 PM   Specimen: BLOOD  Result Value Ref Range Status   Specimen Description BLOOD RIGHT ARM  Final   Special Requests   Final    BOTTLES DRAWN AEROBIC AND ANAEROBIC Blood Culture adequate volume   Culture   Final    NO GROWTH 4 DAYS Performed at [facility], [address], [place], [place] [address]    Report Status PENDING  Incomplete  Blood Culture (routine x 2)     Status: None (Preliminary result)   Collection Time: 08/01/23 10:09 PM   Specimen: BLOOD  Result Value Ref Range Status   Specimen Description BLOOD LEFT WRIST  Final   Special Requests   Final    BOTTLES DRAWN AEROBIC AND ANAEROBIC Blood Culture adequate volume   Culture   Final    NO GROWTH 4 DAYS Performed at [facility], [address], [place], [place] [address]    Report Status PENDING  Incomplete         Radiology Studies: DG Wrist 2 Views Right  Result Date: 08/04/2023 CLINICAL DATA:  Right wrist pain.  Atrial fracture. EXAM: RIGHT WRIST - 2 VIEW COMPARISON:  None Available. FINDINGS: There is diffuse decreased bone mineralization. 2 mm ulnar positive variance. Mild-to-moderate calcification overlying the triangular fibrocartilage complex. Mild triscaphe joint space narrowing and lateral osteophytosis. Severe thumb carpometacarpal joint space narrowing, subchondral sclerosis/cystic change, and peripheral osteophytosis. Mild thumb metacarpophalangeal  osteoarthritis. No acute fracture is seen. No dislocation. Moderate atherosclerotic calcifications. IMPRESSION: 1. Severe thumb carpometacarpal osteoarthritis. 2. Mild triscaphe osteoarthritis. 3. Mild-to-moderate calcification overlying the triangular fibrocartilage complex, which may represent chondrocalcinosis. Electronically Signed   By: Neita Garnet M.D.   On: 08/04/2023 15:50   CT ANGIO HEAD NECK W WO CM  Result Date: 08/04/2023 CLINICAL DATA:  Stroke, follow-up.  Unable to move right arm. EXAM: CT ANGIOGRAPHY HEAD AND NECK WITH AND WITHOUT CONTRAST TECHNIQUE: Multidetector CT imaging of the head and neck was performed using the standard protocol during bolus administration of intravenous contrast. Multiplanar CT image reconstructions and MIPs were obtained to evaluate the vascular anatomy. Carotid stenosis measurements (when applicable) are obtained utilizing NASCET criteria, using the distal internal carotid  diameter as the denominator. RADIATION DOSE REDUCTION: This exam was performed according to the departmental dose-optimization program which includes automated exposure control, adjustment of the mA and/or kV according to patient size and/or use of iterative reconstruction technique. CONTRAST:  75mL OMNIPAQUE IOHEXOL 350 MG/ML SOLN COMPARISON:  CT head without contrast 08/03/2023 and 08/02/2023. FINDINGS: CT HEAD FINDINGS Brain: The right parietal acute/subacute infarct is stable. Other smaller punctate cortical infarcts involving the posterior left frontal lobe and parietal lobe are also stable. No new infarcts are present compared to the most recent exam. Moderate atrophy and white matter disease is stable. The ventricles are proportionate to the degree of atrophy. No significant extraaxial fluid collection is present. The brainstem and cerebellum are within normal limits. Vascular: Atherosclerotic calcifications are again noted within the cavernous internal carotid arteries bilaterally. Skull:  Calvarium is intact. No focal lytic or blastic lesions are present. No significant extracranial soft tissue lesion is present. Sinuses/Orbits: The paranasal sinuses and mastoid air cells are clear. 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 Two metallic foci are present posteriorly in the right orbit. Review of the MIP images confirms the above findings CTA NECK FINDINGS Aortic arch: The aortic arch is incompletely imaged. Atherosclerotic changes are present at the origin of the left subclavian artery without significant stenosis. Right carotid system: Scattered mural calcifications are present in the right common carotid artery. Calcifications are present at the right carotid bifurcation proximal right ICA without significant stenosis relative to the more distal vessel. The cervical right ICA is normal otherwise. Left carotid system: The left common carotid artery is within normal limits. Atherosclerotic calcifications are present at the left carotid bifurcation and proximal left ICA without significant stenosis relative to the more distal vessel. Mild tortuosity is present in the distal cervical left ICA without significant stenosis. Vertebral arteries: The vertebral arteries are codominant. Both vertebral arteries originate from the subclavian arteries without significant stenosis. No significant stenosis is present in either vertebral artery in the neck. Skeleton: Multilevel degenerative changes are present in the facet joints bilaterally, left greater than right. No focal osseous lesions are present. Other neck: Soft tissues the neck are otherwise unremarkable. Salivary glands are within normal limits. Thyroid is normal. No significant adenopathy is present. No focal mucosal or submucosal lesions are present. Upper chest: Paraseptal and centrilobular emphysematous changes are present. No nodule or mass lesion is present. The thoracic inlet is within normal limits. Review of the MIP images confirms the above  findings CTA HEAD FINDINGS Anterior circulation: Atherosclerotic calcifications are present within the cavernous internal carotid arteries bilaterally without focal stenosis relative to the ICA termini. 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 The ICA termini are otherwise within normal limits bilaterally. The anterior communicating artery is patent. The MCA bifurcations are within normal limits bilaterally. The ACA and MCA branch vessels are normal bilaterally. Posterior circulation: Atherosclerotic calcifications are present at the dural margin of both vertebral arteries. No significant stenosis is present. The PICA origins are visualized and normal. The vertebrobasilar junction and basilar artery normal. The superior cerebellar arteries are patent. Both posterior cerebral arteries originate from basilar tip. The PCA branch vessels are within normal limits bilaterally. No aneurysm is present. Venous sinuses: The dural sinuses are patent. The straight sinus and deep cerebral veins are intact. Cortical veins are within normal limits. No significant vascular malformation is evident. Anatomic variants: None. Review of the MIP images confirms the  above findings IMPRESSION: 1. Stable right parietal acute/subacute infarct. 2. Other smaller punctate cortical infarcts involving the posterior left frontal lobe and parietal lobe are also stable. 3. No new infarcts are present compared to the most recent exam. 4. Stable atrophy and white matter disease. This likely reflects the sequela of chronic microvascular ischemia. 5. 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 6. Atherosclerotic changes at the carotid bifurcations and proximal ICAs bilaterally without significant stenosis relative to the more distal vessels. 7. No other significant stenosis, aneurysm, or  branch vessel occlusion within the Circle of Willis. 8. Aortic Atherosclerosis (ICD10-I70.0) and Emphysema (ICD10-J43.9). The above was relayed via text pager to Dr. Caryl Pina on 08/04/2023 at 15:41 . Electronically Signed   By: Marin Roberts M.D.   On: 08/04/2023 15:41   CT HEAD WO CONTRAST ( )  Result Date: 08/03/2023 CLINICAL DATA:  Neuro deficit, acute, stroke suspected. Disoriented. Unable to move right arm. EXAM: CT HEAD WITHOUT CONTRAST TECHNIQUE: Contiguous axial images were obtained from the base of the skull through the vertex without intravenous contrast. RADIATION DOSE REDUCTION: This exam was performed according to the departmental dose-optimization program which includes automated exposure control, adjustment of the mA and/or kV according to patient size and/or use of iterative reconstruction technique. COMPARISON:  CT head without contrast 08/02/2023 FINDINGS: Brain: Focal area of hypoattenuation involving the right parietal cortex is again noted. Punctate focus cortical hypoattenuation is present in the posterior right frontal lobe, potentially new from prior exam. A punctate focus of cortical hypoattenuation is present in the left parietal lobe on image 25 series 2. No acute hemorrhage or mass lesion is present. Generalized atrophy and white matter disease is stable. Deep brain nuclei are within normal limits. The brainstem and cerebellum are within normal limits. Midline structures are within normal limits. Vascular: Atherosclerotic calcifications are present within the cavernous internal carotid arteries bilaterally. No hyperdense vessel is present. Skull: Calvarium is intact. No focal lytic or blastic lesions are present. No significant extracranial soft tissue lesion is present. Sinuses/Orbits: 2 punctate metallic foci are again noted in the posterior right orbit. 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 Left globe and orbit are normal. The paranasal sinuses and mastoid air cells are  clear. IMPRESSION: 1. Punctate focus of cortical hypoattenuation in the posterior right frontal lobe, potentially new from prior exam. This may represent a small acute or subacute infarct. 2. Punctate focus of cortical hypoattenuation in the left parietal lobe is also potentially new from prior exam. 3. Stable focal area of hypoattenuation involving the right parietal cortex. This likely represents a subacute or chronic infarct. Recommend MRI of the brain without contrast for further evaluation of these lesions. 4. Stable generalized atrophy and white matter disease. This likely reflects the sequela of chronic microvascular ischemia. These results will be called to the ordering clinician or representative by the Radiologist Assistant, and communication documented in the PACS or [employer]. Electronically Signed   By: Marin Roberts M.D.   On: 08/03/2023 12:05        Scheduled Meds:  aspirin EC  81 mg Oral Daily   atorvastatin  20 mg Oral Daily   clopidogrel  75 mg Oral Daily   doxycycline  100 mg Oral Q12H   enoxaparin (LOVENOX) injection  40 mg Subcutaneous Q24H   feeding supplement  237 mL Oral BID BM   hydrALAZINE  25 mg Oral BID   insulin aspart  0-6 Units Subcutaneous Q4H   lisinopril  20 mg Oral Daily   potassium chloride  20 mEq Oral Once   Continuous Infusions:  cefTRIAXone (ROCEPHIN)  IV 2 g (08/04/23 2206)     LOS: 3 days       Charise Killian, MD [facility] Pager [phone]  If 7PM-7AM, please contact night-coverage [email or web address] 08/05/2023, 8:26 AM

## 2023-08-06 DIAGNOSIS — I639 Cerebral infarction, unspecified: Secondary | ICD-10-CM | POA: Diagnosis not present

## 2023-08-06 DIAGNOSIS — R4182 Altered mental status, unspecified: Secondary | ICD-10-CM | POA: Diagnosis not present

## 2023-08-06 DIAGNOSIS — R4702 Dysphasia: Secondary | ICD-10-CM | POA: Diagnosis not present

## 2023-08-06 DIAGNOSIS — I6389 Other cerebral infarction: Secondary | ICD-10-CM | POA: Diagnosis not present

## 2023-08-06 LAB — BASIC METABOLIC PANEL
Anion gap: 10 (ref 5–15)
BUN: 25 mg/dL — ABNORMAL HIGH (ref 8–23)
CO2: 24 mmol/L (ref 22–32)
Calcium: 8.3 mg/dL — ABNORMAL LOW (ref 8.9–10.3)
Chloride: 102 mmol/L (ref 98–111)
Creatinine, Ser: 0.7 mg/dL (ref 0.61–1.24)
GFR, Estimated: >60 mL/min (ref >60)
Glucose, Bld: 129 mg/dL — ABNORMAL HIGH (ref 70–99)
Potassium: 3.4 mmol/L — ABNORMAL LOW (ref 3.5–5.1)
Sodium: 136 mmol/L (ref 135–145)

## 2023-08-06 LAB — CULTURE, BLOOD (ROUTINE X 2)
Culture: NO GROWTH
Culture: NO GROWTH
Special Requests: ADEQUATE
Special Requests: ADEQUATE

## 2023-08-06 LAB — ECHOCARDIOGRAM COMPLETE
AR max vel: 2.46 cm2
AV Area VTI: 2.28 cm2
AV Area mean vel: 2.23 cm2
AV Mean grad: 6.5 mm[Hg]
AV Peak grad: 10.7 mm[Hg]
Ao pk vel: 1.63 m/s
Area-P 1/2: 2.99 cm2
Height: 73 in
S' Lateral: 2.7 cm
Weight: 3322.77 [oz_av]

## 2023-08-06 LAB — CBC
HCT: 44.3 % (ref 39.0–52.0)
Hemoglobin: 15.7 g/dL (ref 13.0–17.0)
MCH: 33.6 pg (ref 26.0–34.0)
MCHC: 35.4 g/dL (ref 30.0–36.0)
MCV: 94.9 fL (ref 80.0–100.0)
Platelets: 253 10*3/uL (ref 150–400)
RBC: 4.67 MIL/uL (ref 4.22–5.81)
RDW: 11.7 % (ref 11.5–15.5)
WBC: 9.8 10*3/uL (ref 4.0–10.5)
nRBC: 0 % (ref 0.0–0.2)

## 2023-08-06 LAB — GLUCOSE, CAPILLARY
Glucose-Capillary: 146 mg/dL — ABNORMAL HIGH (ref 70–99)
Glucose-Capillary: 144 mg/dL — ABNORMAL HIGH (ref 70–99)
Glucose-Capillary: 334 mg/dL — ABNORMAL HIGH (ref 70–99)
Glucose-Capillary: 206 mg/dL — ABNORMAL HIGH (ref 70–99)
Glucose-Capillary: 210 mg/dL — ABNORMAL HIGH (ref 70–99)

## 2023-08-06 MED ORDER — POTASSIUM CHLORIDE 20 MEQ PO PACK
40.0000 meq | PACK | Freq: Once | ORAL | Status: AC
  Administered 2023-08-06: 40 meq via ORAL
  Filled 2023-08-06: qty 2
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
## 2023-08-06 NOTE — Progress Notes (Signed)
PROGRESS NOTE    Jeremy Valencia  SAY:[id number] DOB: April 26, 1941 DOA: 08/01/2023 PCP: Lynnea Ferrier, MD   Assessment & Plan:   Principal Problem:   Sepsis [facility]) Active Problems:   Acute metabolic encephalopathy   Essential (primary) hypertension   Type 2 diabetes mellitus (HCC)   Chronic obstructive pulmonary disease (COPD) (HCC)  Assessment and Plan: CVA: repeat CVA shows punctate focus in posterior right frontal lobe, may represent a small acute or subacute infarct, punctate focus in the left parietal lobe. Continue on aspirin, statin, plavix as per neuro. Echo is pending.. Repeat CT head today as per neuro. Pt has metal in his body so unable to get an MRI.  PT/OT recs acute rehab. Continue on dysphagia III diet as per speech   RUE swelling: etiology unclear. [place] was neg for DVT of RUE   SIRS & likely sepsis: met criteria w/ fever, tachycardia, leukocytosis but no source. CXR was normal. Blood cxs NGTD. UA was unremarkable. CT head shows no acute intracranial abnormalities. Pt cannot have an MRI as pt has metal in his body. Abxs d/c 08/06/23 and will monitor.  Sepsis r/o as no source was found    Acute metabolic encephalopathy: likely secondary to CVA. Re-orient prn    HTN: continue on home dose of hydralazine, lisinopril   DM2: fair control, HbA1c 7.3. Continue on SSI w/ accuchecks    COPD: w/o exacerbation. Bronchodilators prn       DVT prophylaxis: lovenox  Code Status: full  Family Communication: discussed pt's care w/ pt's family and answered their questions  Disposition Plan: likely d/c to acute rehab   Level of care: Telemetry Medical  Status is: Inpatient Remains inpatient appropriate because: CVA    Consultants:  Neuro   Procedures:   Antimicrobials:    Subjective: Pt c/o malaise   Objective: Vitals:   08/05/23 1702 08/05/23 2012 08/06/23 0343 08/06/23 0805  BP: (!) 158/90 136/60 (!) 143/82 (!) 155/79  Pulse: 87 98 92 86  Resp: 16 16 20 16    Temp: (!) 97.4 F (36.3 C) 98.2 F (36.8 C) 98.2 F (36.8 C) 98.6 F (37 C)  TempSrc: Oral Oral Oral Oral  SpO2: 96% 94% 100% 95%  Weight:      Height:        Intake/Output Summary (Last 24 hours) at 08/06/2023 0814 Last data filed at 08/05/2023 1937 Gross per 24 hour  Intake 740 ml  Output 900 ml  Net -160 ml   Filed Weights   08/02/23 0336  Weight: 94.2 kg    Examination:  General exam: Appears calm  Respiratory system: clear breath sounds b/l  Cardiovascular system: S1 &S2+. No rubs or clicks  Gastrointestinal system: abd is soft, NT, ND & hypoactive bowel sounds Central nervous system: alert & awake Psychiatry: judgement and insight appears poor. Flat mood and affect    Data Reviewed: I have personally reviewed following labs and imaging studies  CBC: Recent Labs  Lab 08/01/23 2209 08/02/23 0430 08/03/23 0304 08/04/23 0536 08/05/23 0453 08/06/23 0441  WBC 13.7* 13.8* 12.6* 11.9* 12.0* 9.8  NEUTROABS 10.7*  --   --   --   --   --   HGB 14.2 14.9 14.5 14.9 15.2 15.7  HCT 41.8 43.3 41.6 42.5 43.1 44.3  MCV 98.6 95.4 96.3 95.5 94.7 94.9  PLT 195 206 207 212 234 253   Basic Metabolic Panel: Recent Labs  Lab 08/02/23 0430 08/03/23 0304 08/04/23 0536 08/05/23 0453 08/06/23  0441  NA 134* 137 136 135 136  K 3.8 3.8 3.5 3.4* 3.4*  CL 103 103 103 102 102  CO2 24 23 22  21* 24  GLUCOSE 166* 84 136* 126* 129*  BUN 26* 24* 23 21 25*  CREATININE 1.04 0.90 0.92 0.78 0.70  CALCIUM 8.2* 8.2* 8.1* 8.2* 8.3*   GFR: Estimated Creatinine Clearance: 77.7 mL/min (by C-G formula based on SCr of 0.7 mg/dL). Liver Function Tests: Recent Labs  Lab 08/01/23 2209 08/02/23 0430  AST 14* 17  ALT 15 18  ALKPHOS 42 43  BILITOT 0.9 1.4*  PROT 6.2* 6.7  ALBUMIN 3.1* 3.4*   No results for input(s): "LIPASE", "AMYLASE" in the last 168 hours. No results for input(s): "AMMONIA" in the last 168 hours. Coagulation Profile: Recent Labs  Lab 08/01/23 2209  08/03/23 0021  INR 1.2 1.1   Cardiac Enzymes: No results for input(s): "CKTOTAL", "CKMB", "CKMBINDEX", "TROPONINI" in the last 168 hours. BNP (last 3 results) No results for input(s): "PROBNP" in the last 8760 hours. HbA1C: No results for input(s): "HGBA1C" in the last 72 hours.  CBG: Recent Labs  Lab 08/05/23 1700 08/05/23 2045 08/05/23 2353 08/06/23 0348 08/06/23 0802  GLUCAP 167* 278* 171* 146* 144*   Lipid Profile: No results for input(s): "CHOL", "HDL", "LDLCALC", "TRIG", "CHOLHDL", "LDLDIRECT" in the last 72 hours. Thyroid Function Tests: No results for input(s): "TSH", "T4TOTAL", "FREET4", "T3FREE", "THYROIDAB" in the last 72 hours. Anemia Panel: No results for input(s): "VITAMINB12", "FOLATE", "FERRITIN", "TIBC", "IRON", "RETICCTPCT" in the last 72 hours. Sepsis Labs: Recent Labs  Lab 08/01/23 2209 08/02/23 0430  PROCALCITON  --  <0.10  LATICACIDVEN 2.4* 0.7    Recent Results (from the past 240 hour(s))  Resp panel by RT-PCR (RSV, Flu A&B, Covid) Anterior Nasal Swab     Status: None   Collection Time: 08/01/23 10:09 PM   Specimen: Anterior Nasal Swab  Result Value Ref Range Status   SARS Coronavirus 2 by RT PCR NEGATIVE NEGATIVE Final    Comment: (NOTE) SARS-CoV-2 target nucleic acids are NOT DETECTED.  The SARS-CoV-2 RNA is generally detectable in upper respiratory specimens during the acute phase of infection. The lowest concentration of SARS-CoV-2 viral copies this assay can detect is 138 copies/mL. A negative result does not preclude SARS-Cov-2 infection and should not be used as the sole basis for treatment or other patient management decisions. A negative result may occur with  improper specimen collection/handling, submission of specimen other than nasopharyngeal swab, presence of viral mutation(s) within the areas targeted by this assay, and inadequate number of viral copies(<138 copies/mL). A negative result must be combined with clinical  observations, patient history, and epidemiological information. The expected result is Negative.  Fact Sheet for Patients:  BloggerCourse.com  Fact Sheet for Healthcare Providers:  SeriousBroker.it  This test is no t yet approved or cleared by the [place] FDA and  has been authorized for detection and/or diagnosis of SARS-CoV-2 by FDA under an Emergency Use Authorization (EUA). This EUA will remain  in effect (meaning this test can be used) for the duration of the COVID-19 declaration under Section 564(b)(1) of the Act, 21 U.S.C.section 360bbb-3(b)(1), unless the authorization is terminated  or revoked sooner.       Influenza A by PCR NEGATIVE NEGATIVE Final   Influenza B by PCR NEGATIVE NEGATIVE Final    Comment: (NOTE) The Xpert Xpress SARS-CoV-2/FLU/RSV plus assay is intended as an aid in the diagnosis of influenza from Nasopharyngeal swab specimens and  should not be used as a sole basis for treatment. Nasal washings and aspirates are unacceptable for Xpert Xpress SARS-CoV-2/FLU/RSV testing.  Fact Sheet for Patients: BloggerCourse.com  Fact Sheet for Healthcare Providers: SeriousBroker.it  This test is not yet approved or cleared by the [place] FDA and has been authorized for detection and/or diagnosis of SARS-CoV-2 by FDA under an Emergency Use Authorization (EUA). This EUA will remain in effect (meaning this test can be used) for the duration of the COVID-19 declaration under Section 564(b)(1) of the Act, 21 U.S.C. section 360bbb-3(b)(1), unless the authorization is terminated or revoked.     Resp Syncytial Virus by PCR NEGATIVE NEGATIVE Final    Comment: (NOTE) Fact Sheet for Patients: BloggerCourse.com  Fact Sheet for Healthcare Providers: SeriousBroker.it  This test is not yet approved or cleared by  the [place] FDA and has been authorized for detection and/or diagnosis of SARS-CoV-2 by FDA under an Emergency Use Authorization (EUA). This EUA will remain in effect (meaning this test can be used) for the duration of the COVID-19 declaration under Section 564(b)(1) of the Act, 21 U.S.C. section 360bbb-3(b)(1), unless the authorization is terminated or revoked.  Performed at [facility], [address] [place], [place] [address]   Blood Culture (routine x 2)     Status: None   Collection Time: 08/01/23 10:09 PM   Specimen: BLOOD  Result Value Ref Range Status   Specimen Description BLOOD RIGHT ARM  Final   Special Requests   Final    BOTTLES DRAWN AEROBIC AND ANAEROBIC Blood Culture adequate volume   Culture   Final    NO GROWTH 5 DAYS Performed at [facility], [address], [place], [place] [address]    Report Status 08/06/2023 FINAL  Final  Blood Culture (routine x 2)     Status: None   Collection Time: 08/01/23 10:09 PM   Specimen: BLOOD  Result Value Ref Range Status   Specimen Description BLOOD LEFT WRIST  Final   Special Requests   Final    BOTTLES DRAWN AEROBIC AND ANAEROBIC Blood Culture adequate volume   Culture   Final    NO GROWTH 5 DAYS Performed at [facility], [address], [place], [place] [address]    Report Status 08/06/2023 FINAL  Final         Radiology Studies: US Venous Img Upper Uni Right(DVT)  Result Date: 08/05/2023 CLINICAL DATA:  Right upper extremity edema for the past 3 days. Evaluate for DVT. EXAM: RIGHT UPPER EXTREMITY VENOUS DOPPLER ULTRASOUND TECHNIQUE: Gray-scale sonography with graded compression, as well as color Doppler and duplex ultrasound were performed to evaluate the upper extremity deep venous system from the level of the subclavian vein and including the jugular, axillary, basilic, radial, ulnar and upper cephalic vein. Spectral Doppler was utilized to evaluate flow at rest and with  distal augmentation maneuvers. COMPARISON:  None Available. FINDINGS: Contralateral Subclavian Vein: Respiratory phasicity is normal and symmetric with the symptomatic side. No evidence of thrombus. Normal compressibility. Internal Jugular Vein: No evidence of thrombus. Normal compressibility, respiratory phasicity and response to augmentation. Subclavian Vein: No evidence of thrombus. Normal compressibility, respiratory phasicity and response to augmentation. Axillary Vein: No evidence of thrombus. Normal compressibility, respiratory phasicity and response to augmentation. Cephalic Vein: No evidence of thrombus. Normal compressibility, respiratory phasicity and response to augmentation. Basilic Vein: No evidence of thrombus. Normal compressibility, respiratory phasicity and response to augmentation. Brachial Veins: No evidence of thrombus. Normal compressibility, respiratory  phasicity and response to augmentation. Radial Veins: No evidence of thrombus. Normal compressibility, respiratory phasicity and response to augmentation. Ulnar Veins: No evidence of thrombus. Normal compressibility, respiratory phasicity and response to augmentation. Other Findings: There is a moderate amount of subcutaneous edema at the level of the right upper arm. IMPRESSION: No evidence of DVT within the right upper extremity. Electronically Signed   By: Simonne Come M.D.   On: 08/05/2023 14:56   CT HEAD WO CONTRAST ( )  Result Date: 08/05/2023 CLINICAL DATA:  Neuro deficit, acute, stroke suspected. EXAM: CT HEAD WITHOUT CONTRAST TECHNIQUE: Contiguous axial images were obtained from the base of the skull through the vertex without intravenous contrast. RADIATION DOSE REDUCTION: This exam was performed according to the departmental dose-optimization program which includes automated exposure control, adjustment of the mA and/or kV according to patient size and/or use of iterative reconstruction technique. COMPARISON:  CT head without  contrast 08/03/2023 and 08/02/2023. CT angio head and neck 08/04/2023. 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 No new infarcts are present. Moderate atrophy and white matter changes are stable. The ventricles are proportionate to the degree of atrophy. No significant extraaxial fluid collection is present. The brainstem and cerebellum are within normal limits. Midline structures are within normal limits. Vascular: Atherosclerotic calcifications are present within the cavernous internal carotid arteries bilaterally and at the dural margin of both vertebral arteries. No hyperdense vessel is present. Skull: Calvarium is intact. No focal lytic or blastic lesions are present. No significant extracranial soft tissue lesion is present. Sinuses/Orbits: Right globe prosthesis is present. The paranasal sinuses and mastoid air cells are clear. IMPRESSION: 1. Stable bilateral parietal cortical infarcts. 2. No new infarcts or acute intracranial abnormality. 3. Stable atrophy and white matter disease. This likely reflects the sequela of chronic microvascular ischemia. Electronically Signed   By: Marin Roberts M.D.   On: 08/05/2023 09:18   DG Wrist 2 Views Right  Result Date: 08/04/2023 CLINICAL DATA:  Right wrist pain.  Atrial fracture. EXAM: RIGHT WRIST - 2 VIEW COMPARISON:  None Available. FINDINGS: There is diffuse decreased bone mineralization. 2 mm ulnar positive variance. Mild-to-moderate calcification overlying the triangular fibrocartilage complex. Mild triscaphe joint space narrowing and lateral osteophytosis. Severe thumb carpometacarpal joint space narrowing, subchondral sclerosis/cystic change, and peripheral osteophytosis. Mild thumb metacarpophalangeal osteoarthritis. No acute fracture is seen. No dislocation. Moderate atherosclerotic calcifications. IMPRESSION: 1. Severe thumb carpometacarpal osteoarthritis. 2. Mild triscaphe osteoarthritis. 3. Mild-to-moderate  calcification overlying the triangular fibrocartilage complex, which may represent chondrocalcinosis. Electronically Signed   By: Neita Garnet M.D.   On: 08/04/2023 15:50   CT ANGIO HEAD NECK W WO CM  Result Date: 08/04/2023 CLINICAL DATA:  Stroke, follow-up.  Unable to move right arm. EXAM: CT ANGIOGRAPHY HEAD AND NECK WITH AND WITHOUT CONTRAST TECHNIQUE: Multidetector CT imaging of the head and neck was performed using the standard protocol during bolus administration of intravenous contrast. Multiplanar CT image reconstructions and MIPs were obtained to evaluate the vascular anatomy. Carotid stenosis measurements (when applicable) are obtained utilizing NASCET criteria, using the distal internal carotid diameter as the denominator. RADIATION DOSE REDUCTION: This exam was performed according to the departmental dose-optimization program which includes automated exposure control, adjustment of the mA and/or kV according to patient size and/or use of iterative reconstruction technique. CONTRAST:  75mL OMNIPAQUE IOHEXOL 350 MG/ML SOLN COMPARISON:  CT head without contrast 08/03/2023 and 08/02/2023. FINDINGS: CT HEAD FINDINGS Brain: The right parietal acute/subacute infarct is stable. Other smaller punctate cortical infarcts  involving the posterior left frontal lobe and parietal lobe are also stable. No new infarcts are present compared to the most recent exam. Moderate atrophy and white matter disease is stable. The ventricles are proportionate to the degree of atrophy. No significant extraaxial fluid collection is present. The brainstem and cerebellum are within normal limits. Vascular: Atherosclerotic calcifications are again noted within the cavernous internal carotid arteries bilaterally. Skull: Calvarium is intact. No focal lytic or blastic lesions are present. No significant extracranial soft tissue lesion is present. Sinuses/Orbits: The paranasal sinuses and mastoid air cells are clear. 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 Two metallic foci are present posteriorly in the right orbit. Review of the MIP images confirms the above findings CTA NECK FINDINGS Aortic arch: The aortic arch is incompletely imaged. Atherosclerotic changes are present at the origin of the left subclavian artery without significant stenosis. Right carotid system: Scattered mural calcifications are present in the right common carotid artery. Calcifications are present at the right carotid bifurcation proximal right ICA without significant stenosis relative to the more distal vessel. The cervical right ICA is normal otherwise. Left carotid system: The left common carotid artery is within normal limits. Atherosclerotic calcifications are present at the left carotid bifurcation and proximal left ICA without significant stenosis relative to the more distal vessel. Mild tortuosity is present in the distal cervical left ICA without significant stenosis. Vertebral arteries: The vertebral arteries are codominant. Both vertebral arteries originate from the subclavian arteries without significant stenosis. No significant stenosis is present in either vertebral artery in the neck. Skeleton: Multilevel degenerative changes are present in the facet joints bilaterally, left greater than right. No focal osseous lesions are present. Other neck: Soft tissues the neck are otherwise unremarkable. Salivary glands are within normal limits. Thyroid is normal. No significant adenopathy is present. No focal mucosal or submucosal lesions are present. Upper chest: Paraseptal and centrilobular emphysematous changes are present. No nodule or mass lesion is present. The thoracic inlet is within normal limits. Review of the MIP images confirms the above findings CTA HEAD FINDINGS Anterior circulation: Atherosclerotic calcifications are present within the cavernous internal carotid arteries bilaterally without focal stenosis relative to the ICA termini. 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 The ICA termini are otherwise within normal limits bilaterally. The anterior communicating artery is patent. The MCA bifurcations are within normal limits bilaterally. The ACA and MCA branch vessels are normal bilaterally. Posterior circulation: Atherosclerotic calcifications are present at the dural margin of both vertebral arteries. No significant stenosis is present. The PICA origins are visualized and normal. The vertebrobasilar junction and basilar artery normal. The superior cerebellar arteries are patent. Both posterior cerebral arteries originate from basilar tip. The PCA branch vessels are within normal limits bilaterally. No aneurysm is present. Venous sinuses: The dural sinuses are patent. The straight sinus and deep cerebral veins are intact. Cortical veins are within normal limits. No significant vascular malformation is evident. Anatomic variants: None. Review of the MIP images confirms the above findings IMPRESSION: 1. Stable right parietal acute/subacute infarct. 2. Other smaller punctate cortical infarcts involving the posterior left frontal lobe and parietal lobe are also stable. 3. No new infarcts are present compared to the most recent exam. 4. Stable atrophy and white matter disease. This likely reflects the sequela of chronic microvascular ischemia. 5. 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 6. Atherosclerotic changes at the carotid bifurcations and proximal ICAs bilaterally without significant stenosis relative to the more distal vessels. 7. No other significant stenosis, aneurysm, or branch vessel occlusion within the Circle of Willis. 8. Aortic Atherosclerosis (ICD10-I70.0) and Emphysema (ICD10-J43.9). The above was relayed via text pager to Dr. Caryl Pina on 08/04/2023 at 15:41 .  Electronically Signed   By: Marin Roberts M.D.   On: 08/04/2023 15:41        Scheduled Meds:  aspirin EC  81 mg Oral Daily   atorvastatin  20 mg Oral Daily   clopidogrel  75 mg Oral Daily   doxycycline  100 mg Oral Q12H   enoxaparin (LOVENOX) injection  40 mg Subcutaneous Q24H   feeding supplement  237 mL Oral BID BM   hydrALAZINE  25 mg Oral BID   hydrocortisone cream   Topical BID   insulin aspart  0-6 Units Subcutaneous Q4H   lisinopril  20 mg Oral Daily   multivitamin with minerals  1 tablet Oral Daily   potassium chloride  40 mEq Oral Once   Continuous Infusions:  cefTRIAXone (ROCEPHIN)  IV Stopped (08/05/23 1915)     LOS: 4 days       Charise Killian, MD [facility] Pager [phone]  If 7PM-7AM, please contact night-coverage [email or web address] 08/06/2023, 8:14 AM

## 2023-08-06 NOTE — Progress Notes (Signed)
Subjective: Lying in bed comfortably with wife, daughter and son at the bedside.   Objective: Current vital signs: BP (!) 155/79 (BP Location: Left Arm)   Pulse 86   Temp 98.6 F (37 C) (Oral)   Resp 16   Ht 6\' 1"  (1.854 m)   Wt 94.2 kg   SpO2 95%   BMI 27.40 kg/m  Vital signs in last 24 hours: Temp:  [97.4 F (36.3 C)-98.6 F (37 C)] 98.6 F (37 C) (12/01 0805) Pulse Rate:  [86-98] 86 (12/01 0805) Resp:  [16-20] 16 (12/01 0805) BP: (136-158)/(60-90) 155/79 (12/01 0805) SpO2:  [94 %-100 %] 95 % (12/01 0805)  Intake/Output from previous day: 11/30 0701 - 12/01 0700 In: 740 [P.O.:640; IV Piggyback:100] Out: 900 [Urine:900] Intake/Output this shift: No intake/output data recorded. Nutritional status:  Diet Order             DIET DYS 3 Room service appropriate? Yes with Assist; Fluid consistency: Thin  Diet effective now                   Physical Exam HEENT- [facility]/AT   Lungs- Respirations unlabored Extremities- Warm and well-perfused.     Neurological Examination Mental Status: Awake and alert. Language is continues to be improved today, He is still relatively abulic with decreased spontaneous speech and movement. He is able to engage in limited conversation with all replies being short phrases. Follows all commands appropriately. Will smile and make eye contact appropriately. Became drowsy after motor exam and fell asleep during discussion of his condition with family.  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 Fixates and tracks normally with left eye.    III,IV, VI: Decreased blink strength on the right (has prosthesis). Normal blink on the left. Will track to left and right with left eye. No nystagmus. VII: Smiles symmetrically VIII: Hearing intact to voice IX,X: Gag reflex deferred XI: Head is midline XII: Midline tongue extension Motor: RUE: Moderate weakness proximally and distally. Will weakly grip, flex and extend at elbow with 3/5 strength. Unable  to support arm antigravity at shoulder, but can lift with assistance (2/5). Tone to shoulder, elbow and wrist is also reduced. Unable to fully extend or abduct fingers. Moderate weakness of wrist flexion and extension. Unchanged since yesterday (Saturday) LUE: 5/5 RLE: 4/5 proximally with 3/5 ADF and APF LLE: 4+/5 proximally and 5/5 distally Sensory: Reacts to touch x 4. States that sensation is equal on the right and left . Cerebellar: No gross ataxia noted. Gait: Deferred  Lab Results: Results for orders placed or performed during the hospital encounter of 08/01/23 (from the past 48 hour(s))  Glucose, capillary     Status: Abnormal   Collection Time: 08/04/23 11:43 AM  Result Value Ref Range   Glucose-Capillary 128 (H) 70 - 99 mg/dL    Comment: Glucose reference range applies only to samples taken after fasting for at least 8 hours.  Glucose, capillary     Status: Abnormal   Collection Time: 08/04/23  3:56 PM  Result Value Ref Range   Glucose-Capillary 195 (H) 70 - 99 mg/dL    Comment: Glucose reference range applies only to samples taken after fasting for at least 8 hours.  Glucose, capillary     Status: Abnormal   Collection Time: 08/04/23  7:31 PM  Result Value Ref Range   Glucose-Capillary 133 (H) 70 - 99 mg/dL    Comment: Glucose reference range applies only to samples taken after fasting for at least  8 hours.  Glucose, capillary     Status: Abnormal   Collection Time: 08/04/23 11:45 PM  Result Value Ref Range   Glucose-Capillary 134 (H) 70 - 99 mg/dL    Comment: Glucose reference range applies only to samples taken after fasting for at least 8 hours.  Glucose, capillary     Status: Abnormal   Collection Time: 08/05/23  4:09 AM  Result Value Ref Range   Glucose-Capillary 119 (H) 70 - 99 mg/dL    Comment: Glucose reference range applies only to samples taken after fasting for at least 8 hours.  CBC     Status: Abnormal   Collection Time: 08/05/23  4:53 AM  Result Value Ref  Range   WBC 12.0 (H) 4.0 - 10.5 K/uL   RBC 4.55 4.22 - 5.81 MIL/uL   Hemoglobin 15.2 13.0 - 17.0 g/dL   HCT 95.6 21.3 - 08.6 %   MCV 94.7 80.0 - 100.0 fL   MCH 33.4 26.0 - 34.0 pg   MCHC 35.3 30.0 - 36.0 g/dL   RDW 57.8 46.9 - 62.9 %   Platelets 234 150 - 400 K/uL   nRBC 0.0 0.0 - 0.2 %    Comment: Performed at [facility], [address] [place], [place] [address]  Basic metabolic panel     Status: Abnormal   Collection Time: 08/05/23  4:53 AM  Result Value Ref Range   Sodium 135 135 - 145 mmol/L   Potassium 3.4 (L) 3.5 - 5.1 mmol/L   Chloride 102 98 - 111 mmol/L   CO2 21 (L) 22 - 32 mmol/L   Glucose, Bld 126 (H) 70 - 99 mg/dL    Comment: Glucose reference range applies only to samples taken after fasting for at least 8 hours.   BUN 21 8 - 23 mg/dL   Creatinine, Ser 3.24 0.61 - 1.24 mg/dL   Calcium 8.2 (L) 8.9 - 10.3 mg/dL   GFR, Estimated >40 >10 mL/min    Comment: (NOTE) Calculated using the CKD-EPI Creatinine Equation (2021)    Anion gap 12 5 - 15    Comment: Performed at [facility], [address] [place], [place] [address]  Glucose, capillary     Status: Abnormal   Collection Time: 08/05/23  8:11 AM  Result Value Ref Range   Glucose-Capillary 130 (H) 70 - 99 mg/dL    Comment: Glucose reference range applies only to samples taken after fasting for at least 8 hours.   Comment 1 Notify RN    Comment 2 Document in Chart   Glucose, capillary     Status: Abnormal   Collection Time: 08/05/23 12:24 PM  Result Value Ref Range   Glucose-Capillary 272 (H) 70 - 99 mg/dL    Comment: Glucose reference range applies only to samples taken after fasting for at least 8 hours.   Comment 1 Notify RN    Comment 2 Document in Chart   Glucose, capillary     Status: Abnormal   Collection Time: 08/05/23  5:00 PM  Result Value Ref Range   Glucose-Capillary 167 (H) 70 - 99 mg/dL    Comment: Glucose reference range applies only to samples taken after fasting for at  least 8 hours.   Comment 1 Notify RN    Comment 2 Document in Chart   Glucose, capillary     Status: Abnormal   Collection Time: 08/05/23  8:45 PM  Result Value Ref Range   Glucose-Capillary 278 (H) 70 -  99 mg/dL    Comment: Glucose reference range applies only to samples taken after fasting for at least 8 hours.  Glucose, capillary     Status: Abnormal   Collection Time: 08/05/23 11:53 PM  Result Value Ref Range   Glucose-Capillary 171 (H) 70 - 99 mg/dL    Comment: Glucose reference range applies only to samples taken after fasting for at least 8 hours.  Glucose, capillary     Status: Abnormal   Collection Time: 08/06/23  3:48 AM  Result Value Ref Range   Glucose-Capillary 146 (H) 70 - 99 mg/dL    Comment: Glucose reference range applies only to samples taken after fasting for at least 8 hours.  CBC     Status: None   Collection Time: 08/06/23  4:41 AM  Result Value Ref Range   WBC 9.8 4.0 - 10.5 K/uL   RBC 4.67 4.22 - 5.81 MIL/uL   Hemoglobin 15.7 13.0 - 17.0 g/dL   HCT 16.1 09.6 - 04.5 %   MCV 94.9 80.0 - 100.0 fL   MCH 33.6 26.0 - 34.0 pg   MCHC 35.4 30.0 - 36.0 g/dL   RDW 40.9 81.1 - 91.4 %   Platelets 253 150 - 400 K/uL   nRBC 0.0 0.0 - 0.2 %    Comment: Performed at [facility], [address] [place], [place] [address]  Basic metabolic panel     Status: Abnormal   Collection Time: 08/06/23  4:41 AM  Result Value Ref Range   Sodium 136 135 - 145 mmol/L   Potassium 3.4 (L) 3.5 - 5.1 mmol/L   Chloride 102 98 - 111 mmol/L   CO2 24 22 - 32 mmol/L   Glucose, Bld 129 (H) 70 - 99 mg/dL    Comment: Glucose reference range applies only to samples taken after fasting for at least 8 hours.   BUN 25 (H) 8 - 23 mg/dL   Creatinine, Ser 6.21 0.61 - 1.24 mg/dL   Calcium 8.3 (L) 8.9 - 10.3 mg/dL   GFR, Estimated >30 >86 mL/min    Comment: (NOTE) Calculated using the CKD-EPI Creatinine Equation (2021)    Anion gap 10 5 - 15    Comment: Performed at [facility], [address] [place], [place] [address]  Glucose, capillary     Status: Abnormal   Collection Time: 08/06/23  8:02 AM  Result Value Ref Range   Glucose-Capillary 144 (H) 70 - 99 mg/dL    Comment: Glucose reference range applies only to samples taken after fasting for at least 8 hours.    Recent Results (from the past 240 hour(s))  Resp panel by RT-PCR (RSV, Flu A&B, Covid) Anterior Nasal Swab     Status: None   Collection Time: 08/01/23 10:09 PM   Specimen: Anterior Nasal Swab  Result Value Ref Range Status   SARS Coronavirus 2 by RT PCR NEGATIVE NEGATIVE Final    Comment: (NOTE) SARS-CoV-2 target nucleic acids are NOT DETECTED.  The SARS-CoV-2 RNA is generally detectable in upper respiratory specimens during the acute phase of infection. The lowest concentration of SARS-CoV-2 viral copies this assay can detect is 138 copies/mL. A negative result does not preclude SARS-Cov-2 infection and should not be used as the sole basis for treatment or other patient management decisions. A negative result may occur with  improper specimen collection/handling, submission of specimen other than nasopharyngeal swab, presence of viral mutation(s) within the areas targeted by this assay, and inadequate number  of viral copies(<138 copies/mL). A negative result must be combined with clinical observations, patient history, and epidemiological information. The expected result is Negative.  Fact Sheet for Patients:  BloggerCourse.com  Fact Sheet for Healthcare Providers:  SeriousBroker.it  This test is no t yet approved or cleared by the [place] FDA and  has been authorized for detection and/or diagnosis of SARS-CoV-2 by FDA under an Emergency Use Authorization (EUA). This EUA will remain  in effect (meaning this test can be used) for the duration of the COVID-19 declaration under Section 564(b)(1) of the Act, 21 U.S.C.section  360bbb-3(b)(1), unless the authorization is terminated  or revoked sooner.       Influenza A by PCR NEGATIVE NEGATIVE Final   Influenza B by PCR NEGATIVE NEGATIVE Final    Comment: (NOTE) The Xpert Xpress SARS-CoV-2/FLU/RSV plus assay is intended as an aid in the diagnosis of influenza from Nasopharyngeal swab specimens and should not be used as a sole basis for treatment. Nasal washings and aspirates are unacceptable for Xpert Xpress SARS-CoV-2/FLU/RSV testing.  Fact Sheet for Patients: BloggerCourse.com  Fact Sheet for Healthcare Providers: SeriousBroker.it  This test is not yet approved or cleared by the [place] FDA and has been authorized for detection and/or diagnosis of SARS-CoV-2 by FDA under an Emergency Use Authorization (EUA). This EUA will remain in effect (meaning this test can be used) for the duration of the COVID-19 declaration under Section 564(b)(1) of the Act, 21 U.S.C. section 360bbb-3(b)(1), unless the authorization is terminated or revoked.     Resp Syncytial Virus by PCR NEGATIVE NEGATIVE Final    Comment: (NOTE) Fact Sheet for Patients: BloggerCourse.com  Fact Sheet for Healthcare Providers: SeriousBroker.it  This test is not yet approved or cleared by the [place] FDA and has been authorized for detection and/or diagnosis of SARS-CoV-2 by FDA under an Emergency Use Authorization (EUA). This EUA will remain in effect (meaning this test can be used) for the duration of the COVID-19 declaration under Section 564(b)(1) of the Act, 21 U.S.C. section 360bbb-3(b)(1), unless the authorization is terminated or revoked.  Performed at [facility], [address] [place], [place] [address]   Blood Culture (routine x 2)     Status: None   Collection Time: 08/01/23 10:09 PM   Specimen: BLOOD  Result Value Ref Range Status   Specimen  Description BLOOD RIGHT ARM  Final   Special Requests   Final    BOTTLES DRAWN AEROBIC AND ANAEROBIC Blood Culture adequate volume   Culture   Final    NO GROWTH 5 DAYS Performed at [facility], [address], [place], [place] [address]    Report Status 08/06/2023 FINAL  Final  Blood Culture (routine x 2)     Status: None   Collection Time: 08/01/23 10:09 PM   Specimen: BLOOD  Result Value Ref Range Status   Specimen Description BLOOD LEFT WRIST  Final   Special Requests   Final    BOTTLES DRAWN AEROBIC AND ANAEROBIC Blood Culture adequate volume   Culture   Final    NO GROWTH 5 DAYS Performed at [facility], [address] [place], [place] [address]    Report Status 08/06/2023 FINAL  Final    Lipid Panel No results for input(s): "CHOL", "TRIG", "HDL", "CHOLHDL", "VLDL", "LDLCALC" in the last 72 hours.  Studies/Results: US Venous Img Upper Uni Right(DVT)  Result Date: 08/05/2023 CLINICAL DATA:  Right upper extremity edema for the past 3 days. Evaluate  for DVT. EXAM: RIGHT UPPER EXTREMITY VENOUS DOPPLER ULTRASOUND TECHNIQUE: Gray-scale sonography with graded compression, as well as color Doppler and duplex ultrasound were performed to evaluate the upper extremity deep venous system from the level of the subclavian vein and including the jugular, axillary, basilic, radial, ulnar and upper cephalic vein. Spectral Doppler was utilized to evaluate flow at rest and with distal augmentation maneuvers. COMPARISON:  None Available. FINDINGS: Contralateral Subclavian Vein: Respiratory phasicity is normal and symmetric with the symptomatic side. No evidence of thrombus. Normal compressibility. Internal Jugular Vein: No evidence of thrombus. Normal compressibility, respiratory phasicity and response to augmentation. Subclavian Vein: No evidence of thrombus. Normal compressibility, respiratory phasicity and response to augmentation. Axillary Vein: No evidence of thrombus.  Normal compressibility, respiratory phasicity and response to augmentation. Cephalic Vein: No evidence of thrombus. Normal compressibility, respiratory phasicity and response to augmentation. Basilic Vein: No evidence of thrombus. Normal compressibility, respiratory phasicity and response to augmentation. Brachial Veins: No evidence of thrombus. Normal compressibility, respiratory phasicity and response to augmentation. Radial Veins: No evidence of thrombus. Normal compressibility, respiratory phasicity and response to augmentation. Ulnar Veins: No evidence of thrombus. Normal compressibility, respiratory phasicity and response to augmentation. Other Findings: There is a moderate amount of subcutaneous edema at the level of the right upper arm. IMPRESSION: No evidence of DVT within the right upper extremity. Electronically Signed   By: Simonne Come M.D.   On: 08/05/2023 14:56   CT HEAD WO CONTRAST ( )  Result Date: 08/05/2023 CLINICAL DATA:  Neuro deficit, acute, stroke suspected. EXAM: CT HEAD WITHOUT CONTRAST TECHNIQUE: Contiguous axial images were obtained from the base of the skull through the vertex without intravenous contrast. RADIATION DOSE REDUCTION: This exam was performed according to the departmental dose-optimization program which includes automated exposure control, adjustment of the mA and/or kV according to patient size and/or use of iterative reconstruction technique. COMPARISON:  CT head without contrast 08/03/2023 and 08/02/2023. CT angio head and neck 08/04/2023. 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 No new infarcts are present. Moderate atrophy and white matter changes are stable. The ventricles are proportionate to the degree of atrophy. No significant extraaxial fluid collection is present. The brainstem and cerebellum are within normal limits. Midline structures are within normal limits. Vascular: Atherosclerotic calcifications are present within  the cavernous internal carotid arteries bilaterally and at the dural margin of both vertebral arteries. No hyperdense vessel is present. Skull: Calvarium is intact. No focal lytic or blastic lesions are present. No significant extracranial soft tissue lesion is present. Sinuses/Orbits: Right globe prosthesis is present. The paranasal sinuses and mastoid air cells are clear. IMPRESSION: 1. Stable bilateral parietal cortical infarcts. 2. No new infarcts or acute intracranial abnormality. 3. Stable atrophy and white matter disease. This likely reflects the sequela of chronic microvascular ischemia. Electronically Signed   By: Marin Roberts M.D.   On: 08/05/2023 09:18   DG Wrist 2 Views Right  Result Date: 08/04/2023 CLINICAL DATA:  Right wrist pain.  Atrial fracture. EXAM: RIGHT WRIST - 2 VIEW COMPARISON:  None Available. FINDINGS: There is diffuse decreased bone mineralization. 2 mm ulnar positive variance. Mild-to-moderate calcification overlying the triangular fibrocartilage complex. Mild triscaphe joint space narrowing and lateral osteophytosis. Severe thumb carpometacarpal joint space narrowing, subchondral sclerosis/cystic change, and peripheral osteophytosis. Mild thumb metacarpophalangeal osteoarthritis. No acute fracture is seen. No dislocation. Moderate atherosclerotic calcifications. IMPRESSION: 1. Severe thumb carpometacarpal osteoarthritis. 2. Mild triscaphe osteoarthritis. 3. 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 Electronically Signed   By: Neita Garnet M.D.   On: 08/04/2023 15:50   CT ANGIO HEAD NECK W WO CM  Result Date: 08/04/2023 CLINICAL DATA:  Stroke, follow-up.  Unable to move right arm. EXAM: CT ANGIOGRAPHY HEAD AND NECK WITH AND WITHOUT CONTRAST TECHNIQUE: Multidetector CT imaging of the head and neck was performed using the standard protocol during bolus administration of intravenous contrast.  Multiplanar CT image reconstructions and MIPs were obtained to evaluate the vascular anatomy. Carotid stenosis measurements (when applicable) are obtained utilizing NASCET criteria, using the distal internal carotid diameter as the denominator. RADIATION DOSE REDUCTION: This exam was performed according to the departmental dose-optimization program which includes automated exposure control, adjustment of the mA and/or kV according to patient size and/or use of iterative reconstruction technique. CONTRAST:  75mL OMNIPAQUE IOHEXOL 350 MG/ML SOLN COMPARISON:  CT head without contrast 08/03/2023 and 08/02/2023. FINDINGS: CT HEAD FINDINGS Brain: The right parietal acute/subacute infarct is stable. Other smaller punctate cortical infarcts involving the posterior left frontal lobe and parietal lobe are also stable. No new infarcts are present compared to the most recent exam. Moderate atrophy and white matter disease is stable. The ventricles are proportionate to the degree of atrophy. No significant extraaxial fluid collection is present. The brainstem and cerebellum are within normal limits. Vascular: Atherosclerotic calcifications are again noted within the cavernous internal carotid arteries bilaterally. Skull: Calvarium is intact. No focal lytic or blastic lesions are present. No significant extracranial soft tissue lesion is present. Sinuses/Orbits: The paranasal sinuses and mastoid air cells are clear. 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 Two metallic foci are present posteriorly in the right orbit. Review of the MIP images confirms the above findings CTA NECK FINDINGS Aortic arch: The aortic arch is incompletely imaged. Atherosclerotic changes are present at the origin of the left subclavian artery without significant stenosis. Right carotid system: Scattered mural calcifications are present in the right common carotid artery. Calcifications are present at the right carotid bifurcation proximal right ICA without  significant stenosis relative to the more distal vessel. The cervical right ICA is normal otherwise. Left carotid system: The left common carotid artery is within normal limits. Atherosclerotic calcifications are present at the left carotid bifurcation and proximal left ICA without significant stenosis relative to the more distal vessel. Mild tortuosity is present in the distal cervical left ICA without significant stenosis. Vertebral arteries: The vertebral arteries are codominant. Both vertebral arteries originate from the subclavian arteries without significant stenosis. No significant stenosis is present in either vertebral artery in the neck. Skeleton: Multilevel degenerative changes are present in the facet joints bilaterally, left greater than right. No focal osseous lesions are present. Other neck: Soft tissues the neck are otherwise unremarkable. Salivary glands are within normal limits. Thyroid is normal. No significant adenopathy is present. No focal mucosal or submucosal lesions are present. Upper chest: Paraseptal and centrilobular emphysematous changes are present. No nodule or mass lesion is present. The thoracic inlet is within normal limits. Review of the MIP images confirms the above findings CTA HEAD FINDINGS Anterior circulation: Atherosclerotic calcifications are present within the cavernous internal carotid arteries bilaterally without focal stenosis relative to the ICA termini. 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 The ICA termini are otherwise within normal limits bilaterally. The anterior communicating artery is patent. The MCA bifurcations are within normal limits bilaterally. The ACA and MCA branch vessels are normal bilaterally.  Posterior circulation: Atherosclerotic calcifications are present at the dural margin of both vertebral arteries. No significant stenosis is present. The PICA origins are  visualized and normal. The vertebrobasilar junction and basilar artery normal. The superior cerebellar arteries are patent. Both posterior cerebral arteries originate from basilar tip. The PCA branch vessels are within normal limits bilaterally. No aneurysm is present. Venous sinuses: The dural sinuses are patent. The straight sinus and deep cerebral veins are intact. Cortical veins are within normal limits. No significant vascular malformation is evident. Anatomic variants: None. Review of the MIP images confirms the above findings IMPRESSION: 1. Stable right parietal acute/subacute infarct. 2. Other smaller punctate cortical infarcts involving the posterior left frontal lobe and parietal lobe are also stable. 3. No new infarcts are present compared to the most recent exam. 4. Stable atrophy and white matter disease. This likely reflects the sequela of chronic microvascular ischemia. 5. 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 6. Atherosclerotic changes at the carotid bifurcations and proximal ICAs bilaterally without significant stenosis relative to the more distal vessels. 7. No other significant stenosis, aneurysm, or branch vessel occlusion within the Circle of Willis. 8. Aortic Atherosclerosis (ICD10-I70.0) and Emphysema (ICD10-J43.9). The above was relayed via text pager to Dr. Caryl Pina on 08/04/2023 at 15:41 . Electronically Signed   By: Marin Roberts M.D.   On: 08/04/2023 15:41    Medications: Scheduled:  aspirin EC  81 mg Oral Daily   atorvastatin  20 mg Oral Daily   clopidogrel  75 mg Oral Daily   doxycycline  100 mg Oral Q12H   enoxaparin (LOVENOX) injection  40 mg Subcutaneous Q24H   feeding supplement  237 mL Oral BID BM   hydrALAZINE  25 mg Oral BID   hydrocortisone cream   Topical BID   insulin aspart  0-6 Units Subcutaneous Q4H   lisinopril  20 mg Oral Daily   multivitamin with minerals  1 tablet  Oral Daily   potassium chloride  40 mEq Oral Once   Continuous:  cefTRIAXone (ROCEPHIN)  IV Stopped (08/05/23 1915)   TTE: 1. Left ventricular ejection fraction, by estimation, is 55 to 60%. The  left ventricle has normal function. The left ventricle has no regional  wall motion abnormalities. Left ventricular diastolic parameters are  consistent with Grade I diastolic dysfunction (impaired relaxation).   2. Right ventricular systolic function is normal. The right ventricular  size is normal. There is normal pulmonary artery systolic pressure.   3. There is a mobile density of the atrial side of the P2 portion of the  posterior valve leaflet of the mitral valve, measuring 1.16 x 0.73 cm.  Concerning for endocarditis. Recommend TEE to further evaluate.  The mitral valve is normal in structure. Mild mitral valve regurgitation.  No evidence of mitral stenosis. Moderate mitral annular calcification.   4. The aortic valve is tricuspid. There is moderate calcification of the  aortic valve. There is moderate thickening of the aortic valve. Aortic  valve regurgitation is not visualized. No aortic stenosis is present.  Aortic valve area, by VTI measures 2.28  cm. Aortic valve mean  gradient measures 6.5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

## 2023-08-06 NOTE — Progress Notes (Addendum)
SLP Cancellation Note  Patient Details Name: Jeremy Valencia MRN: [id number] DOB: 01-31-1941   Cancelled treatment:       Reason Eval/Treat Not Completed: Patient at procedure or test/unavailable (Pt and family with provider. Will continue efforts as appropriate.)  Addendum: attempted x2 - 0932, 1020; pt with a different provider  Clyde Canterbury, M.S., CCC-SLP Speech-Language Pathologist [facility] [phone] Arnette Felts)  Woodroe Chen 08/06/2023, 9:32 AM

## 2023-08-07 ENCOUNTER — Ambulatory Visit: Payer: PRIVATE HEALTH INSURANCE

## 2023-08-07 DIAGNOSIS — I6389 Other cerebral infarction: Secondary | ICD-10-CM | POA: Diagnosis not present

## 2023-08-07 LAB — GLUCOSE, CAPILLARY
Glucose-Capillary: 163 mg/dL — ABNORMAL HIGH (ref 70–99)
Glucose-Capillary: 168 mg/dL — ABNORMAL HIGH (ref 70–99)
Glucose-Capillary: 179 mg/dL — ABNORMAL HIGH (ref 70–99)
Glucose-Capillary: 190 mg/dL — ABNORMAL HIGH (ref 70–99)
Glucose-Capillary: 278 mg/dL — ABNORMAL HIGH (ref 70–99)
Glucose-Capillary: 292 mg/dL — ABNORMAL HIGH (ref 70–99)
Glucose-Capillary: 339 mg/dL — ABNORMAL HIGH (ref 70–99)

## 2023-08-07 LAB — BASIC METABOLIC PANEL
Anion gap: 8 (ref 5–15)
BUN: 29 mg/dL — ABNORMAL HIGH (ref 8–23)
CO2: 23 mmol/L (ref 22–32)
Calcium: 8.3 mg/dL — ABNORMAL LOW (ref 8.9–10.3)
Chloride: 105 mmol/L (ref 98–111)
Creatinine, Ser: 0.79 mg/dL (ref 0.61–1.24)
GFR, Estimated: >60 mL/min (ref >60)
Glucose, Bld: 170 mg/dL — ABNORMAL HIGH (ref 70–99)
Potassium: 3.6 mmol/L (ref 3.5–5.1)
Sodium: 136 mmol/L (ref 135–145)

## 2023-08-07 LAB — CBC
HCT: 44.2 % (ref 39.0–52.0)
Hemoglobin: 15.3 g/dL (ref 13.0–17.0)
MCH: 32.9 pg (ref 26.0–34.0)
MCHC: 34.6 g/dL (ref 30.0–36.0)
MCV: 95.1 fL (ref 80.0–100.0)
Platelets: 296 10*3/uL (ref 150–400)
RBC: 4.65 MIL/uL (ref 4.22–5.81)
RDW: 11.8 % (ref 11.5–15.5)
WBC: 10.6 10*3/uL — ABNORMAL HIGH (ref 4.0–10.5)
nRBC: 0 % (ref 0.0–0.2)

## 2023-08-07 LAB — MAGNESIUM: Magnesium: 2 mg/dL (ref 1.7–2.4)

## 2023-08-07 MED ORDER — SODIUM CHLORIDE 0.9 % IV SOLN
2.0000 g | INTRAVENOUS | Status: DC
  Administered 2023-08-07 – 2023-08-10 (×4): 2 g via INTRAVENOUS
  Filled 2023-08-07 (×4): qty 20

## 2023-08-07 MED ORDER — SODIUM CHLORIDE 0.9 % IV SOLN: INTRAVENOUS | Status: AC

## 2023-08-07 MED ORDER — DOXYCYCLINE HYCLATE 100 MG IV SOLR
100.0000 mg | Freq: Two times a day (BID) | INTRAVENOUS | Status: DC
  Administered 2023-08-07 – 2023-08-08 (×3): 100 mg via INTRAVENOUS
  Filled 2023-08-07 (×5): qty 100

## 2023-08-07 MED ORDER — TIMOLOL MALEATE 0.5 % OP SOLN
1.0000 [drp] | Freq: Every day | OPHTHALMIC | Status: DC
  Administered 2023-08-07 – 2023-08-09 (×3): 1 [drp] via OPHTHALMIC
  Filled 2023-08-07: qty 5

## 2023-08-07 NOTE — Progress Notes (Signed)
Inpatient Rehab Admissions Coordinator:    CIR following. Await PT note and will likely send case to insurance for authorization once that is in.   Megan Salon, MS, CCC-SLP Rehab Admissions Coordinator  [phone] (celll) [phone] (office)

## 2023-08-07 NOTE — Progress Notes (Signed)
PROGRESS NOTE    JAWORSKI ROUND  EPP:[id number] DOB: [birth year] DOA: 08/01/2023 PCP: Lynnea Ferrier, MD   Assessment & Plan:   Principal Problem:   Sepsis [facility]) Active Problems:   Acute metabolic encephalopathy   Essential (primary) hypertension   Type 2 diabetes mellitus (HCC)   Chronic obstructive pulmonary disease (COPD) (HCC)  Assessment and Plan: CVA: repeat CVA shows punctate focus in posterior right frontal lobe, may represent a small acute or subacute infarct, punctate focus in the left parietal lobe. Continue on aspirin, statin, plavix as per neuro. Echo shows EF 55-60%, no regional wall motion abnormalities, grade I diastolic dysfunction, mobile density of posterior valve, concern for endocarditis, recommend TEE. TEE will be tomorrow. Will restart abxs. NPO after midnight. 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 Pt has metal in his body so unable to get an MRI.  PT/OT recs acute rehab. 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 Low risk of rupture & would not be surgical candidate due to advanced age & overall frailty as per neuro   RUE swelling: etiology unclear. [place] was neg for DVT of RUE   SIRS & likely sepsis: met criteria w/ fever, tachycardia, leukocytosis but no source found. CXR was normal. Blood cxs NGTD. UA was unremarkable. CT head shows no acute intracranial abnormalities. Pt cannot have an MRI as pt has metal in his body. Abxs d/c 08/06/23 bur will restart back for concern for endocarditis.    Acute metabolic encephalopathy: likely secondary to CVA. Re-orient prn    HTN: continue on home dose of lisinopril, hydralazine   DM2: fair control, HbA1c 7.3. Continue on SSI w/ accuchecks    COPD: w/o exacerbation. Bronchodilators       DVT prophylaxis: lovenox  Code Status: full  Family Communication: discussed pt's care w/ pt's family and answered their questions  Disposition Plan:  likely d/c to acute rehab   Level of care: Telemetry Medical  Status is: Inpatient Remains inpatient appropriate because: CVA    Consultants:  Neuro   Procedures:   Antimicrobials:    Subjective: Pt c/o fatigue   Objective: Vitals:   08/06/23 1955 08/07/23 0352 08/07/23 0352 08/07/23 0817  BP: 135/74 137/77 137/77 (!) 149/89  Pulse: 86 93 92 94  Resp:    20  Temp: 99.2 F (37.3 C) 98.6 F (37 C)  98.4 F (36.9 C)  TempSrc: Oral Oral  Oral  SpO2: 95% 95% 95% 96%  Weight:      Height:        Intake/Output Summary (Last 24 hours) at 08/07/2023 0840 Last data filed at 08/06/2023 1600 Gross per 24 hour  Intake 240 ml  Output 1450 ml  Net -1210 ml   Filed Weights   08/02/23 0336  Weight: 94.2 kg    Examination:  General exam: appears comfortable  Respiratory system: clear breath sounds b/l  Cardiovascular system: S1/S2+. No rubs or clicks  Gastrointestinal system: abd is soft, NT, ND & hypoactive bowel sounds Central nervous system: alert & awake  Psychiatry: judgement and insight appears improved. Flat mood and affect    Data Reviewed: I have personally reviewed following labs and imaging studies  CBC: Recent Labs  Lab 08/01/23 2209 08/02/23 0430 08/03/23 0304 08/04/23 0536 08/05/23 0453 08/06/23 0441 08/07/23 0343  WBC 13.7*   < > 12.6* 11.9* 12.0* 9.8 10.6*  NEUTROABS 10.7*  --   --   --   --   --   --  HGB 14.2   < > 14.5 14.9 15.2 15.7 15.3  HCT 41.8   < > 41.6 42.5 43.1 44.3 44.2  MCV 98.6   < > 96.3 95.5 94.7 94.9 95.1  PLT 195   < > 207 212 234 253 296   < > = values in this interval not displayed.   Basic Metabolic Panel: Recent Labs  Lab 08/03/23 0304 08/04/23 0536 08/05/23 0453 08/06/23 0441 08/07/23 0343  NA 137 136 135 136 136  K 3.8 3.5 3.4* 3.4* 3.6  CL 103 103 102 102 105  CO2 23 22 21* 24 23  GLUCOSE 84 136* 126* 129* 170*  BUN 24* 23 21 25* 29*  CREATININE 0.90 0.92 0.78 0.70 0.79  CALCIUM 8.2* 8.1* 8.2* 8.3*  8.3*  MG  --   --   --   --  2.0   GFR: Estimated Creatinine Clearance: 77.7 mL/min (by C-G formula based on SCr of 0.79 mg/dL). Liver Function Tests: Recent Labs  Lab 08/01/23 2209 08/02/23 0430  AST 14* 17  ALT 15 18  ALKPHOS 42 43  BILITOT 0.9 1.4*  PROT 6.2* 6.7  ALBUMIN 3.1* 3.4*   No results for input(s): "LIPASE", "AMYLASE" in the last 168 hours. No results for input(s): "AMMONIA" in the last 168 hours. Coagulation Profile: Recent Labs  Lab 08/01/23 2209 08/03/23 0021  INR 1.2 1.1   Cardiac Enzymes: No results for input(s): "CKTOTAL", "CKMB", "CKMBINDEX", "TROPONINI" in the last 168 hours. BNP (last 3 results) No results for input(s): "PROBNP" in the last 8760 hours. HbA1C: No results for input(s): "HGBA1C" in the last 72 hours.  CBG: Recent Labs  Lab 08/06/23 1555 08/06/23 1957 08/07/23 0010 08/07/23 0350 08/07/23 0822  GLUCAP 206* 210* 168* 163* 179*   Lipid Profile: No results for input(s): "CHOL", "HDL", "LDLCALC", "TRIG", "CHOLHDL", "LDLDIRECT" in the last 72 hours. Thyroid Function Tests: No results for input(s): "TSH", "T4TOTAL", "FREET4", "T3FREE", "THYROIDAB" in the last 72 hours. Anemia Panel: No results for input(s): "VITAMINB12", "FOLATE", "FERRITIN", "TIBC", "IRON", "RETICCTPCT" in the last 72 hours. Sepsis Labs: Recent Labs  Lab 08/01/23 2209 08/02/23 0430  PROCALCITON  --  <0.10  LATICACIDVEN 2.4* 0.7    Recent Results (from the past 240 hour(s))  Resp panel by RT-PCR (RSV, Flu A&B, Covid) Anterior Nasal Swab     Status: None   Collection Time: 08/01/23 10:09 PM   Specimen: Anterior Nasal Swab  Result Value Ref Range Status   SARS Coronavirus 2 by RT PCR NEGATIVE NEGATIVE Final    Comment: (NOTE) SARS-CoV-2 target nucleic acids are NOT DETECTED.  The SARS-CoV-2 RNA is generally detectable in upper respiratory specimens during the acute phase of infection. The lowest concentration of SARS-CoV-2 viral copies this assay can  detect is 138 copies/mL. A negative result does not preclude SARS-Cov-2 infection and should not be used as the sole basis for treatment or other patient management decisions. A negative result may occur with  improper specimen collection/handling, submission of specimen other than nasopharyngeal swab, presence of viral mutation(s) within the areas targeted by this assay, and inadequate number of viral copies(<138 copies/mL). A negative result must be combined with clinical observations, patient history, and epidemiological information. The expected result is Negative.  Fact Sheet for Patients:  BloggerCourse.com  Fact Sheet for Healthcare Providers:  SeriousBroker.it  This test is no t yet approved or cleared by the [place] FDA and  has been authorized for detection and/or diagnosis of SARS-CoV-2 by FDA under  an Emergency Use Authorization (EUA). This EUA will remain  in effect (meaning this test can be used) for the duration of the COVID-19 declaration under Section 564(b)(1) of the Act, 21 U.S.C.section 360bbb-3(b)(1), unless the authorization is terminated  or revoked sooner.       Influenza A by PCR NEGATIVE NEGATIVE Final   Influenza B by PCR NEGATIVE NEGATIVE Final    Comment: (NOTE) The Xpert Xpress SARS-CoV-2/FLU/RSV plus assay is intended as an aid in the diagnosis of influenza from Nasopharyngeal swab specimens and should not be used as a sole basis for treatment. Nasal washings and aspirates are unacceptable for Xpert Xpress SARS-CoV-2/FLU/RSV testing.  Fact Sheet for Patients: BloggerCourse.com  Fact Sheet for Healthcare Providers: SeriousBroker.it  This test is not yet approved or cleared by the [place] FDA and has been authorized for detection and/or diagnosis of SARS-CoV-2 by FDA under an Emergency Use Authorization (EUA). This EUA will remain in  effect (meaning this test can be used) for the duration of the COVID-19 declaration under Section 564(b)(1) of the Act, 21 U.S.C. section 360bbb-3(b)(1), unless the authorization is terminated or revoked.     Resp Syncytial Virus by PCR NEGATIVE NEGATIVE Final    Comment: (NOTE) Fact Sheet for Patients: BloggerCourse.com  Fact Sheet for Healthcare Providers: SeriousBroker.it  This test is not yet approved or cleared by the [place] FDA and has been authorized for detection and/or diagnosis of SARS-CoV-2 by FDA under an Emergency Use Authorization (EUA). This EUA will remain in effect (meaning this test can be used) for the duration of the COVID-19 declaration under Section 564(b)(1) of the Act, 21 U.S.C. section 360bbb-3(b)(1), unless the authorization is terminated or revoked.  Performed at [facility], [address] [place], [place] [address]   Blood Culture (routine x 2)     Status: None   Collection Time: 08/01/23 10:09 PM   Specimen: BLOOD  Result Value Ref Range Status   Specimen Description BLOOD RIGHT ARM  Final   Special Requests   Final    BOTTLES DRAWN AEROBIC AND ANAEROBIC Blood Culture adequate volume   Culture   Final    NO GROWTH 5 DAYS Performed at [facility], [address], [place], [place] [address]    Report Status 08/06/2023 FINAL  Final  Blood Culture (routine x 2)     Status: None   Collection Time: 08/01/23 10:09 PM   Specimen: BLOOD  Result Value Ref Range Status   Specimen Description BLOOD LEFT WRIST  Final   Special Requests   Final    BOTTLES DRAWN AEROBIC AND ANAEROBIC Blood Culture adequate volume   Culture   Final    NO GROWTH 5 DAYS Performed at [facility], [address] [place], [place] [address]    Report Status 08/06/2023 FINAL  Final  Culture, blood (Routine X 2) w Reflex to ID Panel     Status: None (Preliminary result)   Collection Time:  08/06/23  5:42 PM   Specimen: BLOOD  Result Value Ref Range Status   Specimen Description BLOOD BLOOD LEFT ARM  Final   Special Requests   Final    BOTTLES DRAWN AEROBIC ONLY Blood Culture results may not be optimal due to an inadequate volume of blood received in culture bottles   Culture   Final    NO GROWTH < 12 HOURS Performed at [facility], [address], [place], [place] [address]    Report Status PENDING  Incomplete  Culture, blood (Routine X 2) w Reflex to ID Panel     Status: None (Preliminary result)   Collection Time: 08/06/23  5:42 PM   Specimen: BLOOD  Result Value Ref Range Status   Specimen Description BLOOD BLOOD LEFT HAND  Final   Special Requests   Final    BOTTLES DRAWN AEROBIC AND ANAEROBIC Blood Culture results may not be optimal due to an excessive volume of blood received in culture bottles   Culture   Final    NO GROWTH < 12 HOURS Performed at [facility], [address], [place], [place] [address]    Report Status PENDING  Incomplete         Radiology Studies: ECHOCARDIOGRAM COMPLETE  Result Date: 08/06/2023    ECHOCARDIOGRAM REPORT   Patient Name:   Jeremy Valencia Date of Exam: 08/05/2023 Medical Rec #:  [id number]      Height:       73.0 in Accession #:    [id number]     Weight:       207.7 lb Date of Birth:  Jan 12, 1941      BSA:          2.186 m Patient Age:    82 years       BP:           162/84 mmHg Patient Gender: M              HR:           86 bpm. Exam Location:  [facility] Procedure: 2D Echo, Cardiac Doppler and Color Doppler Indications:     Stroke I63.9  History:         Patient has no prior history of Echocardiogram examinations.                  COPD; Risk Factors:Hypertension, Diabetes and Dyslipidemia.  Sonographer:     Lucendia Herrlich RCS Referring Phys:  [id number] Charise Killian Diagnosing Phys: Chilton Si MD IMPRESSIONS  1. Left ventricular ejection fraction, by estimation, is 55 to 60%. The left ventricle has  normal function. The left ventricle has no regional wall motion abnormalities. Left ventricular diastolic parameters are consistent with Grade I diastolic dysfunction (impaired relaxation).  2. Right ventricular systolic function is normal. The right ventricular size is normal. There is normal pulmonary artery systolic pressure.  3. There is a mobile density of the atrial side of the P2 portion of the posterior valve leaflet. Measures 1.16 x 0.73 cm. Concerning for endocarditis. Recommend TEE to further evaluate. The mitral valve is normal in structure. Mild mitral valve regurgitation. No evidence of mitral stenosis. Moderate mitral annular calcification.  4. The aortic valve is tricuspid. There is moderate calcification of the aortic valve. There is moderate thickening of the aortic valve. Aortic valve regurgitation is not visualized. No aortic stenosis is present. Aortic valve area, by VTI measures 2.28  cm. Aortic valve mean gradient measures 6.5 mmHg. Aortic valve Vmax measures 1.63 m/s.  5. The inferior vena cava is normal in size with greater than 50% respiratory variability, suggesting right atrial pressure of 3 mmHg. FINDINGS  Left Ventricle: Left ventricular ejection fraction, by estimation, is 55 to 60%. The left ventricle has normal function. The left ventricle has no regional wall motion abnormalities. The left ventricular internal cavity size was normal in size. There is  no left ventricular hypertrophy. Left ventricular diastolic parameters are consistent with Grade I diastolic dysfunction (impaired relaxation). Indeterminate  filling pressures. Right Ventricle: The right ventricular size is normal. No increase in right ventricular wall thickness. Right ventricular systolic function is normal. There is normal pulmonary artery systolic pressure. The tricuspid regurgitant velocity is 1.38 m/s, and  with an assumed right atrial pressure of 3 mmHg, the estimated right ventricular systolic pressure is 10.6  mmHg. Left Atrium: Left atrial size was normal in size. Right Atrium: Right atrial size was normal in size. Pericardium: There is no evidence of pericardial effusion. Mitral Valve: There is a mobile density of the atrial side of the P2 portion of the posterior valve leaflet. Measures 1.16 x 0.73 cm. Concerning for endocarditis. Recommend TEE to further evaluate. The mitral valve is normal in structure. Moderate mitral  annular calcification. Mild mitral valve regurgitation. No evidence of mitral valve stenosis. Tricuspid Valve: The tricuspid valve is normal in structure. Tricuspid valve regurgitation is trivial. No evidence of tricuspid stenosis. Aortic Valve: The aortic valve is tricuspid. There is moderate calcification of the aortic valve. There is moderate thickening of the aortic valve. Aortic valve regurgitation is not visualized. No aortic stenosis is present. Aortic valve mean gradient measures 6.5 mmHg. Aortic valve peak gradient measures 10.7 mmHg. Aortic valve area, by VTI measures 2.28 cm. Pulmonic Valve: The pulmonic valve was normal in structure. Pulmonic valve regurgitation is not visualized. No evidence of pulmonic stenosis. Aorta: The aortic root is normal in size and structure. Venous: The inferior vena cava is normal in size with greater than 50% respiratory variability, suggesting right atrial pressure of 3 mmHg. IAS/Shunts: No atrial level shunt detected by color flow Doppler.  LEFT VENTRICLE PLAX 2D LVIDd:         3.70 cm   Diastology LVIDs:         2.70 cm   LV e' medial:    7.62 cm/s LV PW:         1.00 cm   LV E/e' medial:  11.2 LV IVS:        1.00 cm   LV e' lateral:   10.00 cm/s LVOT diam:     2.30 cm   LV E/e' lateral: 8.6 LV SV:         71 LV SV Index:   33 LVOT Area:     4.15 cm  RIGHT VENTRICLE             IVC RV S prime:     18.60 cm/s  IVC diam: 1.60 cm TAPSE (M-mode): 2.4 cm LEFT ATRIUM           Index LA diam:      3.00 cm 1.37 cm/m LA Vol (A2C): 35.7 ml 16.33 ml/m LA Vol  (A4C): 26.8 ml 12.26 ml/m  AORTIC VALVE AV Area (Vmax):    2.46 cm AV Area (Vmean):   2.23 cm AV Area (VTI):     2.28 cm AV Vmax:           163.33 cm/s AV Vmean:          116.000 cm/s AV VTI:            0.314 m AV Peak Grad:      10.7 mmHg AV Mean Grad:      6.5 mmHg LVOT Vmax:         96.80 cm/s LVOT Vmean:        62.200 cm/s LVOT VTI:          0.172 m LVOT/AV VTI ratio: 0.55  AORTA Ao Root  diam: 3.30 cm Ao Asc diam:  3.70 cm MITRAL VALVE                TRICUSPID VALVE MV Area (PHT): 2.99 cm     TR Peak grad:   7.6 mmHg MV Decel Time: 254 msec     TR Vmax:        138.00 cm/s MV E velocity: 85.60 cm/s MV A velocity: 149.00 cm/s  SHUNTS MV E/A ratio:  0.57         Systemic VTI:  0.17 m                             Systemic Diam: 2.30 cm Chilton Si MD Electronically signed by Chilton Si MD Signature Date/Time: 08/06/2023/3:45:51 PM    Final    US Venous Img Upper Uni Right(DVT)  Result Date: 08/05/2023 CLINICAL DATA:  Right upper extremity edema for the past 3 days. Evaluate for DVT. EXAM: RIGHT UPPER EXTREMITY VENOUS DOPPLER ULTRASOUND TECHNIQUE: Gray-scale sonography with graded compression, as well as color Doppler and duplex ultrasound were performed to evaluate the upper extremity deep venous system from the level of the subclavian vein and including the jugular, axillary, basilic, radial, ulnar and upper cephalic vein. Spectral Doppler was utilized to evaluate flow at rest and with distal augmentation maneuvers. COMPARISON:  None Available. FINDINGS: Contralateral Subclavian Vein: Respiratory phasicity is normal and symmetric with the symptomatic side. No evidence of thrombus. Normal compressibility. Internal Jugular Vein: No evidence of thrombus. Normal compressibility, respiratory phasicity and response to augmentation. Subclavian Vein: No evidence of thrombus. Normal compressibility, respiratory phasicity and response to augmentation. Axillary Vein: No evidence of thrombus. Normal  compressibility, respiratory phasicity and response to augmentation. Cephalic Vein: No evidence of thrombus. Normal compressibility, respiratory phasicity and response to augmentation. Basilic Vein: No evidence of thrombus. Normal compressibility, respiratory phasicity and response to augmentation. Brachial Veins: No evidence of thrombus. Normal compressibility, respiratory phasicity and response to augmentation. Radial Veins: No evidence of thrombus. Normal compressibility, respiratory phasicity and response to augmentation. Ulnar Veins: No evidence of thrombus. Normal compressibility, respiratory phasicity and response to augmentation. Other Findings: There is a moderate amount of subcutaneous edema at the level of the right upper arm. IMPRESSION: No evidence of DVT within the right upper extremity. Electronically Signed   By: Simonne Come M.D.   On: 08/05/2023 14:56   CT HEAD WO CONTRAST ( )  Result Date: 08/05/2023 CLINICAL DATA:  Neuro deficit, acute, stroke suspected. EXAM: CT HEAD WITHOUT CONTRAST TECHNIQUE: Contiguous axial images were obtained from the base of the skull through the vertex without intravenous contrast. RADIATION DOSE REDUCTION: This exam was performed according to the departmental dose-optimization program which includes automated exposure control, adjustment of the mA and/or kV according to patient size and/or use of iterative reconstruction technique. COMPARISON:  CT head without contrast 08/03/2023 and 08/02/2023. CT angio head and neck 08/04/2023. 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 No new infarcts are present. Moderate atrophy and white matter changes are stable. The ventricles are proportionate to the degree of atrophy. No significant extraaxial fluid collection is present. The brainstem and cerebellum are within normal limits. Midline structures are within normal limits. Vascular: Atherosclerotic calcifications are present within the  cavernous internal carotid arteries bilaterally and at the dural margin of both vertebral arteries. No hyperdense vessel is present. Skull: Calvarium is intact. No focal lytic or blastic lesions are present. No significant extracranial  soft tissue lesion is present. Sinuses/Orbits: Right globe prosthesis is present. The paranasal sinuses and mastoid air cells are clear. IMPRESSION: 1. Stable bilateral parietal cortical infarcts. 2. No new infarcts or acute intracranial abnormality. 3. Stable atrophy and white matter disease. This likely reflects the sequela of chronic microvascular ischemia. Electronically Signed   By: Marin Roberts M.D.   On: 08/05/2023 09:18        Scheduled Meds:  aspirin EC  81 mg Oral Daily   atorvastatin  20 mg Oral Daily   clopidogrel  75 mg Oral Daily   enoxaparin (LOVENOX) injection  40 mg Subcutaneous Q24H   feeding supplement  237 mL Oral BID BM   hydrALAZINE  25 mg Oral BID   hydrocortisone cream   Topical BID   insulin aspart  0-6 Units Subcutaneous Q4H   lisinopril  20 mg Oral Daily   multivitamin with minerals  1 tablet Oral Daily   Continuous Infusions:     LOS: 5 days       Charise Killian, MD [facility] Pager [phone]  If 7PM-7AM, please contact night-coverage [email or web address] 08/07/2023, 8:40 AM

## 2023-08-07 NOTE — Progress Notes (Signed)
SLP Cancellation Note  Patient Details Name: Jeremy Valencia MRN: [id number] DOB: [birth year]   Cancelled treatment:       Reason Eval/Treat Not Completed: Patient declined, no reason specified. Pt having difficulty focusing on evaluation. Pt/family requested hold on evaluation at this time. Will try again another time.   Of note, pt with Master's degree level of education. Pt was driving, and independent with management of finances and medications prior to admit. Wife and daughter present and in agreement.   Jeremy Valencia B. Murvin Natal, [facility], CCC-SLP Speech Language Pathologist  [name] 08/07/2023, 12:48 PM

## 2023-08-07 NOTE — Progress Notes (Signed)
Eeg done 

## 2023-08-07 NOTE — Progress Notes (Signed)
   [facility] has been requested to perform a transesophageal echocardiogram on Jeremy Valencia for ? MV valve vegetation, sepsis, and stroke.  In the setting of altered mental status, I personally spoke with pts wife, [name], this AM.  After careful review of history and examination, the risks and benefits of transesophageal echocardiogram have been explained including risks of esophageal damage, perforation (1:10,000 risk), bleeding, pharyngeal hematoma as well as other potential complications associated with conscious sedation including aspiration, arrhythmia, respiratory failure and death. Alternatives to treatment were discussed, questions were answered. [name] wishes for [place] to proceed.  TEE is scheduled for 12/3 @ 7:45 AM w/ Dr. Mariah Milling.   Nicolasa Ducking, NP  08/07/2023 10:05 AM

## 2023-08-07 NOTE — TOC Progression Note (Addendum)
Transition of Care [facility]) - Progression Note    Patient Details  Name: Jeremy Valencia MRN: [id number] Date of Birth: 04/06/1941  [facility]) CM/SW Contact  Liliana Cline, LCSW Phone Number: 08/07/2023, 2:23 PM  Clinical Narrative:    CIR waiting for PT note today to start insurance auth, CSW reached out to PT.  2:50- Spoke to daughter, spouse, and patient. They verified the SS# on the chart is correct. Called [place] [facility], spoke with [place], she stated they need physical proof. Found old Medicare card from 2014 in Media tab with the same SS#. They requested this be faxed along with copy of Drivers License and current Medicare Card to Jordan at [phone]. Faxed to Jordan.  PT note is in, updated CIR Representative Vernona Rieger.   Expected Discharge Plan: IP Rehab Facility Barriers to Discharge: Continued Medical Work up  Expected Discharge Plan and Services     Post Acute Care Choice: IP Rehab Living arrangements for the past 2 months: Single Family Home                                       Social Determinants of Health (SDOH) Interventions SDOH Screenings   Food Insecurity: Patient Unable To Answer (08/03/2023)  Housing: Patient Unable To Answer (08/03/2023)  Transportation Needs: Patient Unable To Answer (08/03/2023)  Utilities: Patient Unable To Answer (08/03/2023)  Tobacco Use: Medium Risk (08/02/2023)    Readmission Risk Interventions     No data to display

## 2023-08-07 NOTE — Progress Notes (Signed)
Mobility Specialist - Progress Note   08/07/23 1556  Mobility  Activity Stood at bedside;Transferred from chair to bed  Level of Assistance Moderate assist, patient does 50-74%  Assistive Device  (HHA)  Distance Ambulated (ft) 4 ft  Activity Response Tolerated well  $Mobility charge 1 Mobility  Mobility Specialist Start Time (ACUTE ONLY) 1501  Mobility Specialist Stop Time (ACUTE ONLY) 1510  Mobility Specialist Time Calculation (min) (ACUTE ONLY) 9 min   Pt sitting in the recliner upon entry, requesting to transfer to bed. Pt STS to RW ModA +2, requiring assist to place RUE on the RW-- unable to push up from chair with RUE. Pt transferred to bed, min cuing for sequencing and RW management. Pt left supine with alarm set and needs within reach.  Zetta Bills Mobility Specialist 08/07/23 4:13 PM

## 2023-08-07 NOTE — TOC Progression Note (Signed)
Transition of Care [facility]) - Progression Note    Patient Details  Name: Jeremy Valencia MRN: [id number] Date of Birth: 1941-08-23  Transition of Care [facility]) CM/SW Contact  Liliana Cline, LCSW Phone Number: 08/07/2023, 9:52 AM  Clinical Narrative:    Per TOC handoff, patient could not be pulled up in [facility] for SNF work up back up plan if CIR does not work out. Called [facility], they stated they have a different patient linked with this SS #, requested CSW follow up with patient/family to verify the SS #. CSW left VM for patient's wife.   Expected Discharge Plan: IP Rehab Facility Barriers to Discharge: Continued Medical Work up  Expected Discharge Plan and Services     Post Acute Care Choice: IP Rehab Living arrangements for the past 2 months: Single Family Home                                       Social Determinants of Health (SDOH) Interventions SDOH Screenings   Food Insecurity: Patient Unable To Answer (08/03/2023)  Housing: Patient Unable To Answer (08/03/2023)  Transportation Needs: Patient Unable To Answer (08/03/2023)  Utilities: Patient Unable To Answer (08/03/2023)  Tobacco Use: Medium Risk (08/02/2023)    Readmission Risk Interventions     No data to display

## 2023-08-07 NOTE — Progress Notes (Signed)
Physical Therapy Treatment Patient Details Name: Jeremy Valencia MRN: [id number] DOB: 1941/07/29 Today's Date: 08/07/2023   History of Present Illness Pt is an 82 year old male admitted with sepsis, AMS    PMH significant for COPD with emphysema, diabetes mellitus and hypertension being admitted with sepsis    PT Comments  Pt received in bed with wife/daughter at bedside and agreed to PT session. Pt's daughter reported that pt has a rash that has spread throughout his entire back, RN has been notified via family. Pt performed bed mobility ModA for BLE and trunk management, STS with the use of RW (2wheels) ModA, amb ModA with RW ~72ft prior to step pivot transfer to recliner ModA. VC necessary throughout session for pt to correct upright posture while seated EOB and when standing. RUE management important when performing STS and during amb as pt's right arm/hand presents with decreased strength and function. Pt required increased time to perform all mobility and presented with fatigue at the end of session especially within RUE. Pt tolerated Tx well and will continue to benefit from skilled PT sessions to improve strength, activity tolerance, and functional mobility to maximize safety/IND following D/C.    If plan is discharge home, recommend the following: A lot of help with walking and/or transfers;A lot of help with bathing/dressing/bathroom;Assistance with cooking/housework;Direct supervision/assist for financial management;Direct supervision/assist for medications management;Assist for transportation;Help with stairs or ramp for entrance;Supervision due to cognitive status   Can travel by private vehicle        Equipment Recommendations  Other (comment) (TBD at next facility)    Recommendations for Other Services Rehab consult     Precautions / Restrictions Precautions Precautions: Fall Restrictions Weight Bearing Restrictions: No     Mobility  Bed Mobility Overal bed mobility: Needs  Assistance Bed Mobility: Supine to Sit     Supine to sit: Mod assist, HOB elevated, Used rails     General bed mobility comments: Pt performed bed mobility ModA for trunk and BLE management.    Transfers Overall transfer level: Needs assistance Equipment used: Rolling walker (2 wheels) Transfers: Sit to/from Stand, Bed to chair/wheelchair/BSC Sit to Stand: Mod assist   Step pivot transfers: Mod assist       General transfer comment: Pt performed all transfers with the use of RW (2wheels) ModA. VC necessary for upright posture and RW management.    Ambulation/Gait Ambulation/Gait assistance: Mod assist Gait Distance (Feet): 5 Feet Assistive device: Rolling walker (2 wheels) Gait Pattern/deviations: Step-to pattern Gait velocity: decreased     General Gait Details: Pt amb with the use of RW (2wheels) ModA. VC necessary for posture and RW management. Assistance needed for RUE as pt presented with decreased strength/grip strength and function.   Stairs             Wheelchair Mobility     Tilt Bed    Modified Rankin (Stroke Patients Only)       Balance Overall balance assessment: Needs assistance Sitting-balance support: Bilateral upper extremity supported, Feet supported Sitting balance-Leahy Scale: Fair Sitting balance - Comments: slight posterior lean with feet unsupported   Standing balance support: Bilateral upper extremity supported, During functional activity Standing balance-Leahy Scale: Fair                              Cognition Arousal: Alert Behavior During Therapy: WFL for tasks assessed/performed Overall Cognitive Status: Impaired/Different from baseline Area of Impairment: Attention, Memory,  Following commands, Safety/judgement                   Current Attention Level: Focused Memory: Decreased short-term memory Following Commands: Follows one step commands inconsistently Safety/Judgement: Decreased awareness of  safety, Decreased awareness of deficits     General Comments: Pt pleasant and willing to participate in PT session.        Exercises      General Comments        Pertinent Vitals/Pain Pain Assessment Pain Assessment: No/denies pain    Home Living                          Prior Function            PT Goals (current goals can now be found in the care plan section) Acute Rehab PT Goals Patient Stated Goal: did not state PT Goal Formulation: With patient Time For Goal Achievement: 08/16/23 Potential to Achieve Goals: Fair Progress towards PT goals: Progressing toward goals    Frequency    Min 1X/week      PT Plan      Co-evaluation              AM-PAC PT "6 Clicks" Mobility   Outcome Measure  Help needed turning from your back to your side while in a flat bed without using bedrails?: A Lot Help needed moving from lying on your back to sitting on the side of a flat bed without using bedrails?: A Lot Help needed moving to and from a bed to a chair (including a wheelchair)?: A Lot Help needed standing up from a chair using your arms (e.g., wheelchair or bedside chair)?: Total Help needed to walk in hospital room?: Total Help needed climbing 3-5 steps with a railing? : Total 6 Click Score: 9    End of Session Equipment Utilized During Treatment: Gait belt Activity Tolerance: Patient tolerated treatment well Patient left: in chair;with call bell/phone within reach;with chair alarm set;with family/visitor present Nurse Communication: Mobility status PT Visit Diagnosis: Unsteadiness on feet (R26.81);Muscle weakness (generalized) (M62.81);Difficulty in walking, not elsewhere classified (R26.2);Other abnormalities of gait and mobility (R26.89)     Time: 4132-4401 PT Time Calculation (min) (ACUTE ONLY): 35 min  Charges:    $Therapeutic Activity: 23-37 mins PT General Charges $$ ACUTE PT VISIT: 1 Visit                     Kerim Statzer Sauvignon  Howard SPT, LAT, ATC   Letizia Hook Sauvignon-Howard 08/07/2023, 3:18 PM

## 2023-08-07 NOTE — Progress Notes (Addendum)
Inpatient Rehab Admissions Coordinator:   Note plans for TEE tomorrow morning. Will hold on sending case to insurance until that is complete.  Megan Salon, MS, CCC-SLP Rehab Admissions Coordinator  [phone] (celll) [phone] (office)

## 2023-08-08 ENCOUNTER — Encounter: Payer: Self-pay | Admitting: Cardiovascular Disease

## 2023-08-08 ENCOUNTER — Inpatient Hospital Stay: Payer: Medicare HMO | Admitting: Anesthesiology

## 2023-08-08 ENCOUNTER — Encounter
Admission: EM | Disposition: A | Discharge status: Rehabilitation Facility | Payer: Self-pay | Source: Home / Self Care | Attending: Internal Medicine

## 2023-08-08 ENCOUNTER — Inpatient Hospital Stay (HOSPITAL_COMMUNITY)
Admit: 2023-08-08 | Discharge: 2023-08-08 | Disposition: A | Discharge status: Home / Self Care | Payer: Medicare HMO | Attending: Nurse Practitioner | Admitting: Nurse Practitioner

## 2023-08-08 DIAGNOSIS — G9340 Encephalopathy, unspecified: Secondary | ICD-10-CM | POA: Diagnosis not present

## 2023-08-08 DIAGNOSIS — I38 Endocarditis, valve unspecified: Secondary | ICD-10-CM | POA: Diagnosis not present

## 2023-08-08 DIAGNOSIS — I330 Acute and subacute infective endocarditis: Secondary | ICD-10-CM

## 2023-08-08 DIAGNOSIS — I63419 Cerebral infarction due to embolism of unspecified middle cerebral artery: Secondary | ICD-10-CM | POA: Diagnosis not present

## 2023-08-08 DIAGNOSIS — I340 Nonrheumatic mitral (valve) insufficiency: Secondary | ICD-10-CM

## 2023-08-08 DIAGNOSIS — I361 Nonrheumatic tricuspid (valve) insufficiency: Secondary | ICD-10-CM | POA: Diagnosis not present

## 2023-08-08 DIAGNOSIS — I6389 Other cerebral infarction: Secondary | ICD-10-CM | POA: Diagnosis not present

## 2023-08-08 HISTORY — PX: TEE WITHOUT CARDIOVERSION: SHX5443

## 2023-08-08 LAB — MAGNESIUM: Magnesium: 1.9 mg/dL (ref 1.7–2.4)

## 2023-08-08 LAB — GLUCOSE, CAPILLARY
Glucose-Capillary: 165 mg/dL — ABNORMAL HIGH (ref 70–99)
Glucose-Capillary: 187 mg/dL — ABNORMAL HIGH (ref 70–99)
Glucose-Capillary: 257 mg/dL — ABNORMAL HIGH (ref 70–99)
Glucose-Capillary: 337 mg/dL — ABNORMAL HIGH (ref 70–99)

## 2023-08-08 SURGERY — TRANSESOPHAGEAL ECHOCARDIOGRAM (TEE)
Anesthesia: General

## 2023-08-08 MED ORDER — DEXMEDETOMIDINE HCL IN NACL 80 MCG/20ML IV SOLN
INTRAVENOUS | Status: DC | PRN
Start: 2023-08-08 — End: 2023-08-08
  Administered 2023-08-08: 8 ug via INTRAVENOUS

## 2023-08-08 MED ORDER — LIDOCAINE VISCOUS HCL 2 % MT SOLN
OROMUCOSAL | Status: AC
  Filled 2023-08-08: qty 15

## 2023-08-08 MED ORDER — BUTAMBEN-TETRACAINE-BENZOCAINE 2-2-14 % EX AERO
INHALATION_SPRAY | CUTANEOUS | Status: AC
  Filled 2023-08-08: qty 5

## 2023-08-08 MED ORDER — PROPOFOL 10 MG/ML IV BOLUS
INTRAVENOUS | Status: DC | PRN
  Administered 2023-08-08: 40 mg via INTRAVENOUS
  Administered 2023-08-08: 10 mg via INTRAVENOUS
  Administered 2023-08-08: 20 mg via INTRAVENOUS

## 2023-08-08 MED ORDER — LIDOCAINE HCL (PF) 2 % IJ SOLN
INTRAMUSCULAR | Status: DC | PRN
  Administered 2023-08-08: 60 mg via INTRADERMAL

## 2023-08-08 NOTE — TOC Progression Note (Signed)
Transition of Care [facility]) - Progression Note    Patient Details  Name: Jeremy Valencia MRN: [id number] Date of Birth: Nov 15, 1940  [facility]) CM/SW Contact  Chapman Fitch, RN Phone Number: 08/08/2023, 3:40 PM  Clinical Narrative:     Per Rehab admissions coordinator auth pending for CIR   Expected Discharge Plan: IP Rehab Facility Barriers to Discharge: Continued Medical Work up  Expected Discharge Plan and Services     Post Acute Care Choice: IP Rehab Living arrangements for the past 2 months: Single Family Home                                       Social Determinants of Health (SDOH) Interventions SDOH Screenings   Food Insecurity: Patient Unable To Answer (08/03/2023)  Housing: Patient Unable To Answer (08/03/2023)  Transportation Needs: Patient Unable To Answer (08/03/2023)  Utilities: Patient Unable To Answer (08/03/2023)  Tobacco Use: Medium Risk (08/02/2023)    Readmission Risk Interventions     No data to display

## 2023-08-08 NOTE — Consult Note (Signed)
NAME: Jeremy Valencia  DOB: 25-Oct-1940  MRN: [id number]  Date/Time: 08/08/2023 2:14 PM  REQUESTING PROVIDER: Dr.Williams Subjective:  REASON FOR CONSULT: Endocarditis ?History from Jeremy Valencia and Jeremy Valencia at bed side- pt was able to answer questions Jeremy Valencia is a 82 y.o. with a history of DM<HTN, HLD, COPD, diverticulosis presented on 08/01/23 with sudden onset altered mental status Patient was well that morning and ran errands and then felt cold,and tired and went to bed and then he was retching and Jeremy Valencia found him to be confused. Family thought it could be UTI as he had felt like this before  08/01/23  BP 156/82 !  Temp 99.5 F (37.5 C)  Pulse Rate 99  Resp 18  SpO2 96 %    Latest Reference Range & Units 08/01/23  WBC 4.0 - 10.5 K/uL 13.7 (H)  Hemoglobin 13.0 - 17.0 g/dL 40.3  HCT 47.4 - 25.9 % 41.8  Platelets 150 - 400 K/uL 195  Creatinine 0.61 - 1.24 mg/dL 5.63  He was diagnosed as sepsis and started on triple antibiotic after sending blood cultures CT head showed small rt parietal infarct He was found to not move his rt arm and poor verbal response the next day  A repeat CT head showed a new cortical gyrus hypodensity in the rt frontal lobe concerning for an subacute stroke, neurology saw him on 08/03/23 . Diagnosis of acute left MCA stroke 2d echo showed a mobile density on the posterior valve leaflet of the mitral valve of 1.16 cm TEE done today confirmed a large mobile vegetation  And I am seeing the patient for the same. Blood culture negative so far Pt had no fever No weight loss No chest pain No sob No tooth pain or extraction No recent antibiotics 05/03/23 had kenalog injection of rt foot sinus tarsi   Onychomycosis  No pets No animal bites or scratches No travel No hunting, fishing He is active and does a lot of yard work No consumption of raw food  Past Medical History:  Diagnosis Date   Arthritis    BPH (benign prostatic hyperplasia)    Bursitis 05/16/2013    BURSITIS/CAPSULITIS RT ANKLE   Cancer (HCC)    skin cancer   Capsulitis of right foot 01/21/2013   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    Diabetes mellitus without complication (HCC)    Diverticulosis    Emphysema, unspecified (HCC)    GERD (gastroesophageal reflux disease)    Hyperlipidemia    Hypertension    Plantar fasciitis     Past Surgical History:  Procedure Laterality Date   APPENDECTOMY     BACK SURGERY     CATARACT EXTRACTION Left    COLONOSCOPY     COLONOSCOPY WITH PROPOFOL N/A 04/06/2021   Procedure: COLONOSCOPY WITH PROPOFOL;  Surgeon: Regis Bill, MD;  Location: [facility] ENDOSCOPY;  Service: Endoscopy;  Laterality: N/A;  DM   FOOT SURGERY     LAMINECTOMY     right eye     ROTATOR CUFF REPAIR Right    UPPER GI ENDOSCOPY      Social History   Socioeconomic History   Marital status: Married    Spouse name: Not on file   Number of children: Not on file   Years of education: Not on file   Highest education level: Not on file  Occupational History   Not on file  Tobacco Use   Smoking status: Former   Smokeless tobacco: Never   Tobacco  comments:    1998  Vaping Use   Vaping status: Never Used  Substance and Sexual Activity   Alcohol use: Yes    Comment: rarely   Drug use: No   Sexual activity: Never  Other Topics Concern   Not on file  Social History Narrative   Not on file   Social Determinants of Health   Financial Resource Strain: Not on file  Food Insecurity: Patient Unable To Answer (08/03/2023)   Hunger Vital Sign    Worried About Running Out of Food in the Last Year: Patient unable to answer    Ran Out of Food in the Last Year: Patient unable to answer  Transportation Needs: Patient Unable To Answer (08/03/2023)   PRAPARE - Transportation    Lack of Transportation (Medical): Patient unable to answer    Lack of Transportation (Non-Medical): Patient unable to answer  Physical Activity: Not on file  Stress: Not on file  Social  Connections: Not on file  Intimate Partner Violence: Patient Unable To Answer (08/03/2023)   Humiliation, Afraid, Rape, and Kick questionnaire    Fear of Current or Ex-Partner: Patient unable to answer    Emotionally Abused: Patient unable to answer    Physically Abused: Patient unable to answer    Sexually Abused: Patient unable to answer    Family History  Problem Relation Age of Onset   Cerebral aneurysm Mother    CAD Father    Parkinson's disease Father    Prostate cancer Father    Allergies  Allergen Reactions   Metformin Diarrhea   Rofecoxib Other (See Comments)    Other reaction(s): Unknown   Lamisil [Terbinafine] Rash   I? Current Facility-Administered Medications  Medication Dose Route Frequency Provider Last Rate Last Admin   0.9 %  sodium chloride infusion   Intravenous Continuous Creig Hines, NP   New Bag at 08/08/23 0753   acetaminophen (TYLENOL) tablet 650 mg  650 mg Oral Q6H PRN Andris Baumann, MD   650 mg at 08/05/23 2051   Or   acetaminophen (TYLENOL) suppository 650 mg  650 mg Rectal Q6H PRN Andris Baumann, MD       albuterol (PROVENTIL) (2.5 MG/3ML) 0.083% nebulizer solution 2.5 mg  2.5 mg Nebulization Q6H PRN Otelia Sergeant, RPH       aspirin EC tablet 81 mg  81 mg Oral Daily Caryl Pina, MD   81 mg at 08/08/23 0934   atorvastatin (LIPITOR) tablet 20 mg  20 mg Oral Daily Charise Killian, MD   20 mg at 08/08/23 0934   cefTRIAXone (ROCEPHIN) 2 g in sodium chloride 0.9 % 100 mL IVPB  2 g Intravenous Q24H Charise Killian, MD   Stopped at 08/07/23 1451   clopidogrel (PLAVIX) tablet 75 mg  75 mg Oral Daily Caryl Pina, MD   75 mg at 08/08/23 7628   diclofenac Sodium (VOLTAREN) 1 % topical gel 2 g  2 g Topical TID PRN Manuela Schwartz, NP   2 g at 08/05/23 0645   doxycycline (VIBRAMYCIN) 100 mg in dextrose 5 % 250 mL IVPB  100 mg Intravenous Q12H Charise Killian, MD 125 mL/hr at 08/08/23 0336 100 mg at 08/08/23 0336   enoxaparin  (LOVENOX) injection 40 mg  40 mg Subcutaneous Q24H Charise Killian, MD   40 mg at 08/07/23 2246   feeding supplement (ENSURE ENLIVE / ENSURE PLUS) liquid 237 mL  237 mL Oral BID BM Charise Killian, MD  237 mL at 08/08/23 0938   hydrALAZINE (APRESOLINE) tablet 25 mg  25 mg Oral BID Charise Killian, MD   25 mg at 08/08/23 6962   hydrocortisone cream 1 %   Topical BID Charise Killian, MD   Given at 08/08/23 9528   insulin aspart (novoLOG) injection 0-6 Units  0-6 Units Subcutaneous Q4H Charise Killian, MD   3 Units at 08/08/23 1157   lisinopril (ZESTRIL) tablet 20 mg  20 mg Oral Daily Charise Killian, MD   20 mg at 08/08/23 0934   metoprolol tartrate (LOPRESSOR) injection 5 mg  5 mg Intravenous Q6H PRN Charise Killian, MD       multivitamin with minerals tablet 1 tablet  1 tablet Oral Daily Charise Killian, MD   1 tablet at 08/08/23 0934   ondansetron (ZOFRAN) tablet 4 mg  4 mg Oral Q6H PRN Andris Baumann, MD       Or   ondansetron [facility]) injection 4 mg  4 mg Intravenous Q6H PRN Andris Baumann, MD       timolol (TIMOPTIC) 0.5 % ophthalmic solution 1 drop  1 drop Both Eyes Daily Lowella Bandy, RPH   1 drop at 08/08/23 4132     Abtx:  Anti-infectives (From admission, onward)    Start     Dose/Rate Route Frequency Ordered Stop   08/07/23 1500  cefTRIAXone (ROCEPHIN) 2 g in sodium chloride 0.9 % 100 mL IVPB        2 g 200 mL/hr over 30 Minutes Intravenous Every 24 hours 08/07/23 1349     08/07/23 1500  doxycycline (VIBRAMYCIN) 100 mg in dextrose 5 % 250 mL IVPB        100 mg 125 mL/hr over 120 Minutes Intravenous Every 12 hours 08/07/23 1349     08/05/23 1800  doxycycline (VIBRA-TABS) tablet 100 mg  Status:  Discontinued        100 mg Oral Every 12 hours 08/05/23 0818 08/06/23 1318   08/03/23 1800  cefTRIAXone (ROCEPHIN) 2 g in sodium chloride 0.9 % 100 mL IVPB  Status:  Discontinued        2 g 200 mL/hr over 30 Minutes Intravenous Every 24 hours 08/03/23  0907 08/06/23 1318   08/03/23 1600  ceFEPIme (MAXIPIME) 2 g in sodium chloride 0.9 % 100 mL IVPB  Status:  Discontinued        2 g 200 mL/hr over 30 Minutes Intravenous Every 8 hours 08/03/23 0905 08/03/23 0907   08/03/23 1500  doxycycline (VIBRAMYCIN) 100 mg in dextrose 5 % 250 mL IVPB  Status:  Discontinued        100 mg 125 mL/hr over 120 Minutes Intravenous Every 12 hours 08/03/23 1418 08/05/23 0818   08/02/23 1000  metroNIDAZOLE (FLAGYL) IVPB 500 mg  Status:  Discontinued        500 mg 100 mL/hr over 60 Minutes Intravenous Every 12 hours 08/02/23 0152 08/03/23 1417   08/02/23 1000  vancomycin (VANCOREADY) IVPB 1750 mg/350 mL  Status:  Discontinued        1,750 mg 175 mL/hr over 120 Minutes Intravenous Every 24 hours 08/02/23 0346 08/03/23 1417   08/02/23 0800  ceFEPIme (MAXIPIME) 2 g in sodium chloride 0.9 % 100 mL IVPB  Status:  Discontinued        2 g 200 mL/hr over 30 Minutes Intravenous Every 12 hours 08/02/23 0346 08/03/23 0905   08/02/23 0100  vancomycin (VANCOCIN) IVPB 1000 mg/200 mL  premix        1,000 mg 200 mL/hr over 60 Minutes Intravenous  Once 08/02/23 0047 08/02/23 0303   08/02/23 0100  metroNIDAZOLE (FLAGYL) IVPB 500 mg        500 mg 100 mL/hr over 60 Minutes Intravenous  Once 08/02/23 0047 08/02/23 0303   08/01/23 2215  cefTRIAXone (ROCEPHIN) 2 g in sodium chloride 0.9 % 100 mL IVPB  Status:  Discontinued        2 g 200 mL/hr over 30 Minutes Intravenous Every 24 hours 08/01/23 2207 08/02/23 0346       REVIEW OF SYSTEMS:  Const: negative fever, , negative weight loss Eyes: negative diplopia or visual changes, negative eye pain ENT: negative coryza, negative sore throat Resp: negative cough, hemoptysis, dyspnea Cards: negative for chest pain, palpitations, lower extremity edema GU: negative for frequency, dysuria and hematuria GI: Negative for abdominal pain, diarrhea, bleeding, constipation Skin: has an itchy rash on his back Heme: negative for easy bruising  and gum/nose bleeding YS:AYTKZSW Neurolo:rt arm weakness  Psych: negative for feelings of anxiety, depression  Endocrine: , diabetes Allergy/Immunology- as above  Objective:  VITALS:  BP 127/68   Pulse 85   Temp 98.1 F (36.7 C) (Oral)   Resp 18   Ht 6\' 1"  (1.854 m)   Wt 94.2 kg   SpO2 96%   BMI 27.40 kg/m   PHYSICAL EXAM:  General: Alert, cooperative, no distress, appears stated age.  Head: Normocephalic, without obvious abnormality, atraumatic. Eyes: Conjunctivae clear, anicteric sclerae. Pupils are equal ENT Nares normal. No drainage or sinus tenderness. Tongue coated Neck: Supple, symmetrical, no adenopathy, thyroid: non tender no carotid bruit and no JVD. Back: No CVA tenderness. Lungs: Clear to auscultation bilaterally. No Wheezing or Rhonchi. No rales. Heart: Regular rate and rhythm, no murmur, rub or gallop. Abdomen: Soft, non-tender,not distended. Bowel sounds normal. No masses Extremities: atraumatic, no cyanosis. No edema. No clubbing Skin: erythematous rash just on the back      + bruising Lymph: Cervical, supraclavicular normal. Neurologic: Grossly non-focal Pertinent Labs Lab Results CBC    Component Value Date/Time   WBC 10.6 (H) 08/07/2023 0343   RBC 4.65 08/07/2023 0343   HGB 15.3 08/07/2023 0343   HCT 44.2 08/07/2023 0343   PLT 296 08/07/2023 0343   MCV 95.1 08/07/2023 0343   MCH 32.9 08/07/2023 0343   MCHC 34.6 08/07/2023 0343   RDW 11.8 08/07/2023 0343   LYMPHSABS 1.0 08/01/2023 2209   MONOABS 1.9 (H) 08/01/2023 2209   EOSABS 0.1 08/01/2023 2209   BASOSABS 0.0 08/01/2023 2209       Latest Ref Rng & Units 08/07/2023    3:43 AM 08/06/2023    4:41 AM 08/05/2023    4:53 AM  CMP  Glucose 70 - 99 mg/dL 109  323  557   BUN 8 - 23 mg/dL 29  25  21    Creatinine 0.61 - 1.24 mg/dL 3.22  0.25  4.27   Sodium 135 - 145 mmol/L 136  136  135   Potassium 3.5 - 5.1 mmol/L 3.6  3.4  3.4   Chloride 98 - 111 mmol/L 105  102  102   CO2 22 - 32  mmol/L 23  24  21    Calcium 8.9 - 10.3 mg/dL 8.3  8.3  8.2       Microbiology: Recent Results (from the past 240 hour(s))  Resp panel by RT-PCR (RSV, Flu A&B, Covid) Anterior Nasal Swab  Status: None   Collection Time: 08/01/23 10:09 PM   Specimen: Anterior Nasal Swab  Result Value Ref Range Status   SARS Coronavirus 2 by RT PCR NEGATIVE NEGATIVE Final    Comment: (NOTE) SARS-CoV-2 target nucleic acids are NOT DETECTED.  The SARS-CoV-2 RNA is generally detectable in upper respiratory specimens during the acute phase of infection. The lowest concentration of SARS-CoV-2 viral copies this assay can detect is 138 copies/mL. A negative result does not preclude SARS-Cov-2 infection and should not be used as the sole basis for treatment or other patient management decisions. A negative result may occur with  improper specimen collection/handling, submission of specimen other than nasopharyngeal swab, presence of viral mutation(s) within the areas targeted by this assay, and inadequate number of viral copies(<138 copies/mL). A negative result must be combined with clinical observations, patient history, and epidemiological information. The expected result is Negative.  Fact Sheet for Patients:  BloggerCourse.com  Fact Sheet for Healthcare Providers:  SeriousBroker.it  This test is no t yet approved or cleared by the [place] FDA and  has been authorized for detection and/or diagnosis of SARS-CoV-2 by FDA under an Emergency Use Authorization (EUA). This EUA will remain  in effect (meaning this test can be used) for the duration of the COVID-19 declaration under Section 564(b)(1) of the Act, 21 U.S.C.section 360bbb-3(b)(1), unless the authorization is terminated  or revoked sooner.       Influenza A by PCR NEGATIVE NEGATIVE Final   Influenza B by PCR NEGATIVE NEGATIVE Final    Comment: (NOTE) The Xpert Xpress  SARS-CoV-2/FLU/RSV plus assay is intended as an aid in the diagnosis of influenza from Nasopharyngeal swab specimens and should not be used as a sole basis for treatment. Nasal washings and aspirates are unacceptable for Xpert Xpress SARS-CoV-2/FLU/RSV testing.  Fact Sheet for Patients: BloggerCourse.com  Fact Sheet for Healthcare Providers: SeriousBroker.it  This test is not yet approved or cleared by the [place] FDA and has been authorized for detection and/or diagnosis of SARS-CoV-2 by FDA under an Emergency Use Authorization (EUA). This EUA will remain in effect (meaning this test can be used) for the duration of the COVID-19 declaration under Section 564(b)(1) of the Act, 21 U.S.C. section 360bbb-3(b)(1), unless the authorization is terminated or revoked.     Resp Syncytial Virus by PCR NEGATIVE NEGATIVE Final    Comment: (NOTE) Fact Sheet for Patients: BloggerCourse.com  Fact Sheet for Healthcare Providers: SeriousBroker.it  This test is not yet approved or cleared by the [place] FDA and has been authorized for detection and/or diagnosis of SARS-CoV-2 by FDA under an Emergency Use Authorization (EUA). This EUA will remain in effect (meaning this test can be used) for the duration of the COVID-19 declaration under Section 564(b)(1) of the Act, 21 U.S.C. section 360bbb-3(b)(1), unless the authorization is terminated or revoked.  Performed at [facility], [address] [place], [place] [address]   Blood Culture (routine x 2)     Status: None   Collection Time: 08/01/23 10:09 PM   Specimen: BLOOD  Result Value Ref Range Status   Specimen Description BLOOD RIGHT ARM  Final   Special Requests   Final    BOTTLES DRAWN AEROBIC AND ANAEROBIC Blood Culture adequate volume   Culture   Final    NO GROWTH 5 DAYS Performed at [facility], [address] [place], [place] [address]    Report Status 08/06/2023 FINAL  Final  Blood Culture (routine x 2)  Status: None   Collection Time: 08/01/23 10:09 PM   Specimen: BLOOD  Result Value Ref Range Status   Specimen Description BLOOD LEFT WRIST  Final   Special Requests   Final    BOTTLES DRAWN AEROBIC AND ANAEROBIC Blood Culture adequate volume   Culture   Final    NO GROWTH 5 DAYS Performed at [facility], [address] [place], [place] [address]    Report Status 08/06/2023 FINAL  Final  Culture, blood (Routine X 2) w Reflex to ID Panel     Status: None (Preliminary result)   Collection Time: 08/06/23  5:42 PM   Specimen: BLOOD  Result Value Ref Range Status   Specimen Description BLOOD BLOOD LEFT ARM  Final   Special Requests   Final    BOTTLES DRAWN AEROBIC ONLY Blood Culture results may not be optimal due to an inadequate volume of blood received in culture bottles   Culture   Final    NO GROWTH 2 DAYS Performed at [facility], [address] [place], [place] [address]    Report Status PENDING  Incomplete  Culture, blood (Routine X 2) w Reflex to ID Panel     Status: None (Preliminary result)   Collection Time: 08/06/23  5:42 PM   Specimen: BLOOD  Result Value Ref Range Status   Specimen Description BLOOD BLOOD LEFT HAND  Final   Special Requests   Final    BOTTLES DRAWN AEROBIC AND ANAEROBIC Blood Culture results may not be optimal due to an excessive volume of blood received in culture bottles   Culture   Final    NO GROWTH 2 DAYS Performed at [facility], [address] [place], [place] [address]    Report Status PENDING  Incomplete    IMAGING RESULTS: CT head reviewed Infracts frontal/parietal lobe I have personally reviewed the films ? Impression/Recommendation Encephalopathy New CVA -Rt hemiparesis Embolic stroke Mital valve vegetation Blood culture negative  No obvious sepsis or source for the vegetation and  endocarditis Will do work up for culture neg endocarditis Sizeable vegetation of > 10 mm- will discuss with cardiologist regarding role for surgery especially with ongoing risk of embolization Will send ESR/CRP/bartonella, coxiella, beta d glucan, ? Cell free plasma Karius  Pt currently on ceftriaxone and doxy- will change the latter to vanco or dapto after labs are sent   ?DM on sliding insulin     H/o lumbar fusion surgery No evidence of infection at the site________________________________________________ Discussed with patient, and family at bed side Note:  This document was prepared using Dragon voice recognition software and may include unintentional dictation errors.

## 2023-08-08 NOTE — Progress Notes (Signed)
*  PRELIMINARY RESULTS* Echocardiogram Echocardiogram Transesophageal has been performed.  Jeremy Valencia 08/08/2023, 8:31 AM

## 2023-08-08 NOTE — Progress Notes (Signed)
Occupational Therapy Treatment Patient Details Name: Jeremy Valencia MRN: [id number] DOB: [birth year] Today's Date: 08/08/2023   History of present illness Pt is an 82 year old male admitted with sepsis, AMS    PMH significant for COPD with emphysema, diabetes mellitus and hypertension being admitted with sepsis   OT comments  Jeremy Valencia was seen for OT treatment on this date. Upon arrival to room pt in bed reporting fatigue from morning procedure, agreeable to limited tx. Pt requires MOD A for bed mobility. MIN A sit<>stand x3 trials, good standing tolerance ~5 min x2 trials. MAX A pericare standing. Pt making good progress toward goals, will continue to follow POC. Discharge recommendation remains appropriate.        If plan is discharge home, recommend the following:  A lot of help with walking and/or transfers;A lot of help with bathing/dressing/bathroom;Assistance with cooking/housework;Direct supervision/assist for medications management;Assist for transportation;Supervision due to cognitive status;Help with stairs or ramp for entrance;Direct supervision/assist for financial management;Assistance with feeding   Equipment Recommendations  Other (comment) (defer)    Recommendations for Other Services      Precautions / Restrictions Precautions Precautions: Fall Restrictions Weight Bearing Restrictions: No       Mobility Bed Mobility Overal bed mobility: Needs Assistance Bed Mobility: Rolling, Sit to Supine, Sidelying to Sit Rolling: Mod assist Sidelying to sit: Mod assist   Sit to supine: Mod assist        Transfers Overall transfer level: Needs assistance Equipment used: Rolling walker (2 wheels) Transfers: Sit to/from Stand Sit to Stand: Min assist                 Balance Overall balance assessment: Needs assistance Sitting-balance support: Feet supported, Single extremity supported Sitting balance-Leahy Scale: Good     Standing balance support: Single  extremity supported Standing balance-Leahy Scale: Fair                             ADL either performed or assessed with clinical judgement   ADL Overall ADL's : Needs assistance/impaired                                       General ADL Comments: MAX A pericare in standing.    Extremity/Trunk Assessment Upper Extremity Assessment Upper Extremity Assessment: RUE deficits/detail RUE Deficits / Details: grip and bicep 3/5, deltoid 2/5             Cognition Arousal: Alert Behavior During Therapy: WFL for tasks assessed/performed Overall Cognitive Status: Impaired/Different from baseline Area of Impairment: Following commands, Safety/judgement                       Following Commands: Follows one step commands with increased time Safety/Judgement: Decreased awareness of safety, Decreased awareness of deficits                         Pertinent Vitals/ Pain       Pain Assessment Pain Assessment: No/denies pain   Frequency  Min 1X/week        Progress Toward Goals  OT Goals(current goals can now be found in the care plan section)  Progress towards OT goals: Progressing toward goals  Acute Rehab OT Goals OT Goal Formulation: With patient/family Time For Goal Achievement: 08/16/23 Potential to Achieve Goals: Good  ADL Goals Pt Will Perform Grooming: with modified independence;sitting Pt Will Perform Lower Body Dressing: with modified independence;sit to/from stand Pt Will Transfer to Toilet: with modified independence;ambulating Pt Will Perform Toileting - Clothing Manipulation and hygiene: with modified independence;sit to/from stand Pt/caregiver will Perform Home Exercise Program: Increased ROM;Right Upper extremity;With written HEP provided  Plan      Co-evaluation                 AM-PAC OT "6 Clicks" Daily Activity     Outcome Measure   Help from another person eating meals?: A Lot Help from another  person taking care of personal grooming?: A Lot Help from another person toileting, which includes using toliet, bedpan, or urinal?: A Lot Help from another person bathing (including washing, rinsing, drying)?: A Lot Help from another person to put on and taking off regular upper body clothing?: A Lot Help from another person to put on and taking off regular lower body clothing?: A Lot 6 Click Score: 12    End of Session Equipment Utilized During Treatment: Rolling walker (2 wheels)  OT Visit Diagnosis: Other abnormalities of gait and mobility (R26.89);Muscle weakness (generalized) (M62.81);Hemiplegia and hemiparesis Hemiplegia - Right/Left: Right Hemiplegia - dominant/non-dominant: Dominant Hemiplegia - caused by: Unspecified   Activity Tolerance Patient tolerated treatment well   Patient Left in bed;with call bell/phone within reach;with bed alarm set;with family/visitor present   Nurse Communication Mobility status        Time: 5188-4166 OT Time Calculation (min): 18 min  Charges: OT General Charges $OT Visit: 1 Visit OT Treatments $Self Care/Home Management : 8-22 mins  Kathie Dike, M.S. OTR/L  08/08/23, 12:49 PM  ascom [phone]

## 2023-08-08 NOTE — Progress Notes (Signed)
Transesophageal Echocardiogram :  Indication: Sepsis, stroke, valve endocarditis Requesting/ordering  physician: Dr. Mayford Knife  Procedure: Benzocaine spray x2 and 2 mls x 2 of viscous lidocaine were given orally to provide local anesthesia to the oropharynx. The patient was positioned supine on the left side, bite block provided. The patient was moderately sedated with the doses of versed and fentanyl as detailed below.  Using digital technique an omniplane probe was advanced into the distal esophagus without incident.   Moderate sedation: 1. Sedation used: Per anesthesia, propofol provided  See report in EPIC  for complete details: In brief,  Large mobile vegetation concerning for endocarditis noted on posterior leaflet mitral valve.  Mild mitral valve regurgitation transgastric imaging revealed normal LV function with no RWMAs and no mural apical thrombus.  .  Estimated ejection fraction was 55%.  Right sided cardiac chambers were normal with no evidence of pulmonary hypertension.  Imaging of the septum showed no ASD or VSD Bubble study was negative for shunt 2D and color flow confirmed no PFO  The LA was well visualized in orthogonal views.  There was no spontaneous contrast and no thrombus in the LA and LA appendage   The descending thoracic aorta had no  mural aortic debris with no evidence of aneurysmal dilation or dissection.  Mild diffuse aortic atherosclerosis  Complete report to follow   Jeremy Valencia 08/08/2023 8:31 AM

## 2023-08-08 NOTE — Progress Notes (Signed)
Physical Therapy Treatment Patient Details Name: Jeremy Valencia MRN: [id number] DOB: 06-29-1941 Today's Date: 08/08/2023   History of Present Illness Pt is an 82 year old male admitted with sepsis, AMS    PMH significant for COPD with emphysema, diabetes mellitus and hypertension being admitted with sepsis    PT Comments  Pt received in bed and agreed to PT session. Pt performed bed mobility ModA for BLE and trunk management, STS with the use of RW (2wheels) MinA, amb ModA with RW ~48ft prior to step pivot transfer to recliner ModA. VC necessary throughout session for pt to correct upright posture while seated EOB and when standing. RUE management while using RW (2wheels) important when performing STS and during amb as pt's right hand presents with decreased strength and function. Pt additionally performed STS with the use of hemiwalker, however pt presented less stable with a right lean when using the hemiwalker on the left side. With further mobility, pt used the RW (2wheels). Pt required increased time to perform all mobility and presented with fatigue at the end of session especially within RUE. Pt tolerated Tx well and will continue to benefit from skilled PT sessions to improve strength, activity tolerance, and functional mobility to maximize safety/IND following D/C.    If plan is discharge home, recommend the following: A lot of help with walking and/or transfers;A lot of help with bathing/dressing/bathroom;Assistance with cooking/housework;Direct supervision/assist for financial management;Direct supervision/assist for medications management;Assist for transportation;Help with stairs or ramp for entrance;Supervision due to cognitive status   Can travel by private vehicle        Equipment Recommendations  Other (comment) (TBD at next facility)    Recommendations for Other Services       Precautions / Restrictions Precautions Precautions: Fall Restrictions Weight Bearing Restrictions:  No     Mobility  Bed Mobility Overal bed mobility: Needs Assistance Bed Mobility: Supine to Sit     Supine to sit: Mod assist, HOB elevated, Used rails     General bed mobility comments: Pt performed bed mobility ModA for trunk and BLE management.    Transfers Overall transfer level: Needs assistance Equipment used: Rolling walker (2 wheels), Hemi-walker Transfers: Sit to/from Stand, Bed to chair/wheelchair/BSC Sit to Stand: Min assist   Step pivot transfers: Mod assist       General transfer comment: Pt performed STS with the use of RW (2wheels) MinA. STS was also performed with hemiwalker however pt presented with less stability. VC necessary for upright posture and RW management with all transfers    Ambulation/Gait Ambulation/Gait assistance: Mod assist Gait Distance (Feet): 5 Feet Assistive device: Rolling walker (2 wheels) Gait Pattern/deviations: Step-to pattern Gait velocity: decreased     General Gait Details: Pt amb with the use of RW (2wheels) ModA. VC necessary for posture and RW management. Assistance needed for RUE as pt presented with decreased strength/grip strength and function.   Stairs             Wheelchair Mobility     Tilt Bed    Modified Rankin (Stroke Patients Only)       Balance Overall balance assessment: Needs assistance Sitting-balance support: Feet supported, Single extremity supported Sitting balance-Leahy Scale: Good     Standing balance support: Single extremity supported, During functional activity, Reliant on assistive device for balance Standing balance-Leahy Scale: Fair  Cognition Arousal: Alert Behavior During Therapy: WFL for tasks assessed/performed Overall Cognitive Status: Impaired/Different from baseline Area of Impairment: Following commands, Safety/judgement                       Following Commands: Follows one step commands with increased  time Safety/Judgement: Decreased awareness of safety, Decreased awareness of deficits     General Comments: Pt pleasant and willing to participate in PT session.        Exercises      General Comments        Pertinent Vitals/Pain Pain Assessment Pain Assessment: No/denies pain    Home Living                          Prior Function            PT Goals (current goals can now be found in the care plan section) Acute Rehab PT Goals Patient Stated Goal: did not state PT Goal Formulation: With patient Time For Goal Achievement: 08/16/23 Potential to Achieve Goals: Fair Progress towards PT goals: Progressing toward goals    Frequency    Min 1X/week      PT Plan      Co-evaluation              AM-PAC PT "6 Clicks" Mobility   Outcome Measure  Help needed turning from your back to your side while in a flat bed without using bedrails?: A Lot Help needed moving from lying on your back to sitting on the side of a flat bed without using bedrails?: A Lot Help needed moving to and from a bed to a chair (including a wheelchair)?: A Lot Help needed standing up from a chair using your arms (e.g., wheelchair or bedside chair)?: Total Help needed to walk in hospital room?: Total Help needed climbing 3-5 steps with a railing? : Total 6 Click Score: 9    End of Session Equipment Utilized During Treatment: Gait belt Activity Tolerance: Patient tolerated treatment well;Patient limited by fatigue Patient left: in chair;with call bell/phone within reach;with chair alarm set;with nursing/sitter in room Nurse Communication: Mobility status PT Visit Diagnosis: Unsteadiness on feet (R26.81);Muscle weakness (generalized) (M62.81);Difficulty in walking, not elsewhere classified (R26.2);Other abnormalities of gait and mobility (R26.89)     Time: 9629-5284 PT Time Calculation (min) (ACUTE ONLY): 30 min  Charges:    $Therapeutic Activity: 23-37 mins PT General  Charges $$ ACUTE PT VISIT: 1 Visit                     Gian Ybarra Sauvignon Howard SPT, LAT, ATC   Chanti Golubski Sauvignon-Howard 08/08/2023, 4:43 PM

## 2023-08-08 NOTE — Transfer of Care (Signed)
Immediate Anesthesia Transfer of Care Note  Patient: Jeremy Valencia  Procedure(s) Performed: TRANSESOPHAGEAL ECHOCARDIOGRAM (TEE)  Patient Location: PACU  Anesthesia Type:General  Level of Consciousness: awake, alert , and oriented  Airway & Oxygen Therapy: Patient Spontanous Breathing  Post-op Assessment: Report given to RN and Post -op Vital signs reviewed and stable  Post vital signs: Reviewed and stable  Last Vitals:  Vitals Value Taken Time  BP 108/64 08/08/23 0825  Temp    Pulse 83 08/08/23 0825  Resp 22 08/08/23 0825  SpO2 96 % 08/08/23 0825  Vitals shown include unfiled device data.  Last Pain:  Vitals:   08/08/23 0750  TempSrc: Oral  PainSc: 0-No pain      Patients Stated Pain Goal: 0 (08/07/23 2040)  Complications: No notable events documented.

## 2023-08-08 NOTE — Inpatient Diabetes Management (Signed)
Inpatient Diabetes Program Recommendations  AACE/ADA: New Consensus Statement on Inpatient Glycemic Control   Target Ranges:  Prepandial:   less than 140 mg/dL      Peak postprandial:   less than 180 mg/dL (1-2 hours)      Critically ill patients:  140 - 180 mg/dL    Latest Reference Range & Units 08/07/23 08:22 08/07/23 12:00 08/07/23 16:18 08/07/23 19:42 08/07/23 23:08 08/08/23 03:41  Glucose-Capillary 70 - 99 mg/dL 144 (H) 315 (H) 400 (H) 292 (H) 190 (H) 165 (H)   Review of Glycemic Control  Diabetes history: DM2 Outpatient Diabetes medications: Amaryl 4 mg QAM, Jardiance 10 mg daily Current orders for Inpatient glycemic control: Novolog 0-6 units Q4H  Inpatient Diabetes Program Recommendations:    Insulin: Please consider ordering Semglee 6 units Q24H.  Thanks, Orlando Penner, RN, MSN, CDCES Diabetes Coordinator Inpatient Diabetes Program [phone] (Team Pager from 8am to 5pm)

## 2023-08-08 NOTE — Progress Notes (Signed)
PROGRESS NOTE   HPI ws taken from Dr. Para March: Jeremy Valencia is a 82 y.o. male with medical history significant for COPD with emphysema, diabetes mellitus and hypertension being admitted with sepsis of unknown source after presenting with lethargy, chills, nausea and confusion.  Most of the history is given by the wife at the bedside who stated that patient was in his usual state of health earlier in the day and they ran a lot of errands however on the arrival home at 6:30 PM, he started feeling cold and went to lie down.  He remained in bed which is unusual for him and at about 9:30 PM she heard him retching and when she went to turn to him he was confused so she decided to get him to the hospital.   Wife states he has had UTIs in the past and this is how he presents.Of note patient was hospitalized in 2023 as well as 2019 with sepsis of unknown source presenting with fever and leukocytosis.  ED course and data review:BP 156/82, temp 99.9, pulse 99 and O2 sat 96% on room air. Labs notable for WBC 13,700 with lactic acid 2.4 Respiratory viral panel negative for COVID, flu and RSV CMP notable for mild hyponatremia of 132 Urinalysis sterile EKG, personally viewed and interpreted, showing sinus at 96 with borderline ST depression in lateral leads Chest x-ray nonacute Patient started on IV fluids, Rocephin and Flagyl and vancomycin for sepsis of unknown source Hospitalist consulted for admission.   As per Dr. Mayford Knife 11/27-12/3/24: Pt initially present w/ AMS of unknown etiology. Initial CT head was neg for CVA. Repeat CT head did show CVA. Neuro was consulted. Echo was done and show concern for possible endocarditis. 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 Pt is on IV rocephin, IV doxycycline. ID was consulted today. PT/OT recs inpatient rehab.     Jeremy Valencia  KGM:[id number] DOB: 08-Mar-1941 DOA: 08/01/2023 PCP: Lynnea Ferrier, MD   Assessment & Plan:   Principal  Problem:   Sepsis [facility]) Active Problems:   Acute metabolic encephalopathy   Essential (primary) hypertension   Type 2 diabetes mellitus (HCC)   Chronic obstructive pulmonary disease (COPD) (HCC)  Assessment and Plan: CVA: repeat CVA shows punctate focus in posterior right frontal lobe, may represent a small acute or subacute infarct, punctate focus in the left parietal lobe. Continue on aspirin, statin, plavix as per neuro. Echo shows EF 55-60%, no regional wall motion abnormalities, grade I diastolic dysfunction, mobile density of posterior valve, concern for endocarditis, recommend TEE. S/p TEE which confirm large vegetation of mitral valve as per cardio. 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 Pt has metal in his body so unable to get an MRI. S/p EEG and EEG is pending. Neuro following and recs apprec. PT/OT recs acute rehab. 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 Low risk of rupture & would not be surgical candidate due to advanced age & overall frailty as per neuro   RUE swelling: etiology unclear. [place] was neg for DVT of RUE   Sepsis: met criteria w/ fever, tachycardia, leukocytosis & secondary to endocarditis. CXR was normal. Blood cxs NGTD. UA was unremarkable. Repeat CR head did show stable b/l parietal cortical infarcts. Pt cannot have an MRI as pt has metal in his body. Abxs d/c 08/06/23 but started back on 08/07/23 b/c concern for endocarditis.  Endocarditis:  large mobile vegetation found on posterior leaflet of mitral valve found on TEE 08/08/23 as per cardio. Continue on IV rocephin, doxy. ID consulted   Acute metabolic encephalopathy: likely secondary to CVA. Mental status waxes and wanes. Re-orient prn    HTN: continue on hydralazine, lisinopril   DM2: fair control, HbA1c 7.3. Continue on SSI w/ accuchecks    COPD: w/o exacerbation. Bronchodilators prn       DVT prophylaxis: lovenox  Code  Status: full  Family Communication: discussed pt's care w/ pt's family and answered their questions  Disposition Plan: likely d/c to acute rehab   Level of care: Telemetry Medical  Status is: Inpatient Remains inpatient appropriate because: CVA, endocarditis     Consultants:  Neuro  ID Cardio  Procedures:   Antimicrobials: rocephin, doxy    Subjective: Pt c/o malaise   Objective: Vitals:   08/08/23 0824 08/08/23 0825 08/08/23 0830 08/08/23 0840  BP:  108/64 108/66 112/66  Pulse: 78 81 77 75  Resp: (!) 21 20 (!) 21 18  Temp:      TempSrc:      SpO2: 96% 96% 97% 93%  Weight:      Height:        Intake/Output Summary (Last 24 hours) at 08/08/2023 0845 Last data filed at 08/07/2023 1800 Gross per 24 hour  Intake 995.87 ml  Output 1000 ml  Net -4.13 ml   Filed Weights   08/02/23 0336  Weight: 94.2 kg    Examination:  General exam: appears calm & comfortable  Respiratory system: clear breath sounds b/l  Cardiovascular system:S1 & S2+. No rubs or clicks  Gastrointestinal system: abd is soft, NT, ND & hyperactive bowel sounds Central nervous system: alert & awake Psychiatry: judgement and insight appears improved. Flat mood and affect    Data Reviewed: I have personally reviewed following labs and imaging studies  CBC: Recent Labs  Lab 08/01/23 2209 08/02/23 0430 08/03/23 0304 08/04/23 0536 08/05/23 0453 08/06/23 0441 08/07/23 0343  WBC 13.7*   < > 12.6* 11.9* 12.0* 9.8 10.6*  NEUTROABS 10.7*  --   --   --   --   --   --   HGB 14.2   < > 14.5 14.9 15.2 15.7 15.3  HCT 41.8   < > 41.6 42.5 43.1 44.3 44.2  MCV 98.6   < > 96.3 95.5 94.7 94.9 95.1  PLT 195   < > 207 212 234 253 296   < > = values in this interval not displayed.   Basic Metabolic Panel: Recent Labs  Lab 08/03/23 0304 08/04/23 0536 08/05/23 0453 08/06/23 0441 08/07/23 0343 08/08/23 0443  NA 137 136 135 136 136  --   K 3.8 3.5 3.4* 3.4* 3.6  --   CL 103 103 102 102 105  --    CO2 23 22 21* 24 23  --   GLUCOSE 84 136* 126* 129* 170*  --   BUN 24* 23 21 25* 29*  --   CREATININE 0.90 0.92 0.78 0.70 0.79  --   CALCIUM 8.2* 8.1* 8.2* 8.3* 8.3*  --   MG  --   --   --   --  2.0 1.9   GFR: Estimated Creatinine Clearance: 77.7 mL/min (by C-G formula based on SCr of 0.79 mg/dL). Liver Function Tests: Recent Labs  Lab 08/01/23 2209 08/02/23 0430  AST 14* 17  ALT 15 18  ALKPHOS 42 43  BILITOT 0.9 1.4*  PROT 6.2* 6.7  ALBUMIN 3.1* 3.4*   No results for input(s): "LIPASE", "AMYLASE" in the last 168 hours. No results for input(s): "AMMONIA" in the last 168 hours. Coagulation Profile: Recent Labs  Lab 08/01/23 2209 08/03/23 0021  INR 1.2 1.1   Cardiac Enzymes: No results for input(s): "CKTOTAL", "CKMB", "CKMBINDEX", "TROPONINI" in the last 168 hours. BNP (last 3 results) No results for input(s): "PROBNP" in the last 8760 hours. HbA1C: No results for input(s): "HGBA1C" in the last 72 hours.  CBG: Recent Labs  Lab 08/07/23 1618 08/07/23 1942 08/07/23 2308 08/08/23 0341 08/08/23 0755  GLUCAP 278* 292* 190* 165* 187*   Lipid Profile: No results for input(s): "CHOL", "HDL", "LDLCALC", "TRIG", "CHOLHDL", "LDLDIRECT" in the last 72 hours. Thyroid Function Tests: No results for input(s): "TSH", "T4TOTAL", "FREET4", "T3FREE", "THYROIDAB" in the last 72 hours. Anemia Panel: No results for input(s): "VITAMINB12", "FOLATE", "FERRITIN", "TIBC", "IRON", "RETICCTPCT" in the last 72 hours. Sepsis Labs: Recent Labs  Lab 08/01/23 2209 08/02/23 0430  PROCALCITON  --  <0.10  LATICACIDVEN 2.4* 0.7    Recent Results (from the past 240 hour(s))  Resp panel by RT-PCR (RSV, Flu A&B, Covid) Anterior Nasal Swab     Status: None   Collection Time: 08/01/23 10:09 PM   Specimen: Anterior Nasal Swab  Result Value Ref Range Status   SARS Coronavirus 2 by RT PCR NEGATIVE NEGATIVE Final    Comment: (NOTE) SARS-CoV-2 target nucleic acids are NOT DETECTED.  The  SARS-CoV-2 RNA is generally detectable in upper respiratory specimens during the acute phase of infection. The lowest concentration of SARS-CoV-2 viral copies this assay can detect is 138 copies/mL. A negative result does not preclude SARS-Cov-2 infection and should not be used as the sole basis for treatment or other patient management decisions. A negative result may occur with  improper specimen collection/handling, submission of specimen other than nasopharyngeal swab, presence of viral mutation(s) within the areas targeted by this assay, and inadequate number of viral copies(<138 copies/mL). A negative result must be combined with clinical observations, patient history, and epidemiological information. The expected result is Negative.  Fact Sheet for Patients:  BloggerCourse.com  Fact Sheet for Healthcare Providers:  SeriousBroker.it  This test is no t yet approved or cleared by the [place] FDA and  has been authorized for detection and/or diagnosis of SARS-CoV-2 by FDA under an Emergency Use Authorization (EUA). This EUA will remain  in effect (meaning this test can be used) for the duration of the COVID-19 declaration under Section 564(b)(1) of the Act, 21 U.S.C.section 360bbb-3(b)(1), unless the authorization is terminated  or revoked sooner.       Influenza A by PCR NEGATIVE NEGATIVE Final   Influenza B by PCR NEGATIVE NEGATIVE Final    Comment: (NOTE) The Xpert Xpress SARS-CoV-2/FLU/RSV plus assay is intended as an aid in the diagnosis of influenza from Nasopharyngeal swab specimens and should not be used as a sole basis for treatment. Nasal washings and aspirates are unacceptable for Xpert Xpress SARS-CoV-2/FLU/RSV testing.  Fact Sheet for Patients: BloggerCourse.com  Fact Sheet for Healthcare Providers: SeriousBroker.it  This test is not yet approved or  cleared by the [place] FDA and has been authorized for detection and/or diagnosis of SARS-CoV-2 by FDA under an Emergency Use Authorization (EUA). This EUA will remain in effect (meaning this test can be used) for the duration of the COVID-19 declaration under Section 564(b)(1) of the Act, 21 U.S.C. section 360bbb-3(b)(1), unless the authorization is terminated or revoked.  Resp Syncytial Virus by PCR NEGATIVE NEGATIVE Final    Comment: (NOTE) Fact Sheet for Patients: BloggerCourse.com  Fact Sheet for Healthcare Providers: SeriousBroker.it  This test is not yet approved or cleared by the [place] FDA and has been authorized for detection and/or diagnosis of SARS-CoV-2 by FDA under an Emergency Use Authorization (EUA). This EUA will remain in effect (meaning this test can be used) for the duration of the COVID-19 declaration under Section 564(b)(1) of the Act, 21 U.S.C. section 360bbb-3(b)(1), unless the authorization is terminated or revoked.  Performed at [facility], [address], [place], [place] [address]   Blood Culture (routine x 2)     Status: None   Collection Time: 08/01/23 10:09 PM   Specimen: BLOOD  Result Value Ref Range Status   Specimen Description BLOOD RIGHT ARM  Final   Special Requests   Final    BOTTLES DRAWN AEROBIC AND ANAEROBIC Blood Culture adequate volume   Culture   Final    NO GROWTH 5 DAYS Performed at [facility], [address] [place], [place] [address]    Report Status 08/06/2023 FINAL  Final  Blood Culture (routine x 2)     Status: None   Collection Time: 08/01/23 10:09 PM   Specimen: BLOOD  Result Value Ref Range Status   Specimen Description BLOOD LEFT WRIST  Final   Special Requests   Final    BOTTLES DRAWN AEROBIC AND ANAEROBIC Blood Culture adequate volume   Culture   Final    NO GROWTH 5 DAYS Performed at [facility], [address], [place], [place] [address]    Report Status 08/06/2023 FINAL  Final  Culture, blood (Routine X 2) w Reflex to ID Panel     Status: None (Preliminary result)   Collection Time: 08/06/23  5:42 PM   Specimen: BLOOD  Result Value Ref Range Status   Specimen Description BLOOD BLOOD LEFT ARM  Final   Special Requests   Final    BOTTLES DRAWN AEROBIC ONLY Blood Culture results may not be optimal due to an inadequate volume of blood received in culture bottles   Culture   Final    NO GROWTH 2 DAYS Performed at [facility], [address], [place], [place] [address]    Report Status PENDING  Incomplete  Culture, blood (Routine X 2) w Reflex to ID Panel     Status: None (Preliminary result)   Collection Time: 08/06/23  5:42 PM   Specimen: BLOOD  Result Value Ref Range Status   Specimen Description BLOOD BLOOD LEFT HAND  Final   Special Requests   Final    BOTTLES DRAWN AEROBIC AND ANAEROBIC Blood Culture results may not be optimal due to an excessive volume of blood received in culture bottles   Culture   Final    NO GROWTH 2 DAYS Performed at [facility], [address] [place], [place] [address]    Report Status PENDING  Incomplete         Radiology Studies: No results found.      Scheduled Meds:  aspirin EC  81 mg Oral Daily   atorvastatin  20 mg Oral Daily   butamben-tetracaine-benzocaine       clopidogrel  75 mg Oral Daily   enoxaparin (LOVENOX) injection  40 mg Subcutaneous Q24H   feeding supplement  237 mL Oral BID BM   hydrALAZINE  25 mg Oral BID   hydrocortisone cream  Topical BID   insulin aspart  0-6 Units Subcutaneous Q4H   lidocaine       lisinopril  20 mg Oral Daily   multivitamin with minerals  1 tablet Oral Daily   timolol  1 drop Both Eyes Daily   Continuous Infusions:  sodium chloride     cefTRIAXone (ROCEPHIN)  IV Stopped (08/07/23 1451)   doxycycline (VIBRAMYCIN) IV 100 mg (08/08/23 0336)      LOS: 6 days        Charise Killian, MD [facility] Pager [phone]  If 7PM-7AM, please contact night-coverage [email or web address] 08/08/2023, 8:45 AM

## 2023-08-08 NOTE — Anesthesia Preprocedure Evaluation (Signed)
Anesthesia Evaluation  Patient identified by MRN, date of birth, ID band Patient awake    Reviewed: Allergy & Precautions, H&P , NPO status , Patient's Chart, lab work & pertinent test results, reviewed documented beta blocker date and time   Airway Mallampati: II  TM Distance: >3 FB Neck ROM: full    Dental  (+) Dental Advidsory Given, Partial Upper, Teeth Intact   Pulmonary neg shortness of breath, COPD, neg recent URI, former smoker   Pulmonary exam normal breath sounds clear to auscultation       Cardiovascular Exercise Tolerance: Good hypertension, Pt. on medications (-) angina + Peripheral Vascular Disease  (-) Past MI and (-) Cardiac Stents Normal cardiovascular exam(-) dysrhythmias (-) Valvular Problems/Murmurs Rhythm:regular Rate:Normal  1. Left ventricular ejection fraction, by estimation, is 55 to 60%. The  left ventricle has normal function. The left ventricle has no regional  wall motion abnormalities. Left ventricular diastolic parameters are  consistent with Grade I diastolic  dysfunction (impaired relaxation).   2. Right ventricular systolic function is normal. The right ventricular  size is normal. There is normal pulmonary artery systolic pressure.   3. There is a mobile density of the atrial side of the P2 portion of the  posterior valve leaflet. Measures 1.16 x 0.73 cm. Concerning for  endocarditis. Recommend TEE to further evaluate. The mitral valve is  normal in structure. Mild mitral valve  regurgitation. No evidence of mitral stenosis. Moderate mitral annular  calcification.   4. The aortic valve is tricuspid. There is moderate calcification of the  aortic valve. There is moderate thickening of the aortic valve. Aortic  valve regurgitation is not visualized. No aortic stenosis is present.  Aortic valve area, by VTI measures 2.28   cm. Aortic valve mean gradient measures 6.5 mmHg. Aortic valve Vmax   measures 1.63 m/s.   5. The inferior vena cava is normal in size with greater than 50%  respiratory variability, suggesting right atrial pressure of 3 mmHg.     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  No date: Diabetes mellitus without complication (HCC) No date: Diverticulosis No date: Emphysema, unspecified (HCC) No date: GERD (gastroesophageal reflux disease) No date: Hyperlipidemia No date: Hypertension No date: Plantar fasciitis   Reproductive/Obstetrics negative OB ROS                             Anesthesia Physical Anesthesia Plan  ASA: 3  Anesthesia Plan: General   Post-op Pain Management: Minimal or no pain anticipated   Induction: Intravenous  PONV Risk Score and Plan: 2 and TIVA and Propofol infusion  Airway Management Planned: Natural Airway and Nasal Cannula  Additional Equipment: None  Intra-op Plan:   Post-operative Plan:   Informed Consent: I have reviewed the patients History and Physical, chart, labs and discussed the procedure including the risks, benefits and alternatives for the proposed anesthesia with the  patient or authorized representative who has indicated his/her understanding and acceptance.     Dental Advisory Given  Plan Discussed with: Anesthesiologist, CRNA and Surgeon  Anesthesia Plan Comments: (Discussed risks of anesthesia with patient (who is AO x 3 today and  understands the procedure, appears to have capacity for informed consent for this procedure) as well as his wife on the phone, including possibility of difficulty with spontaneous ventilation under anesthesia necessitating airway intervention, PONV, and rare risks such as cardiac or respiratory or neurological events, and allergic reactions. Discussed the role of CRNA in patient's perioperative care. Patient understands.)        Anesthesia Quick Evaluation

## 2023-08-08 NOTE — Anesthesia Postprocedure Evaluation (Signed)
Anesthesia Post Note  Patient: Jeremy Valencia  Procedure(s) Performed: TRANSESOPHAGEAL ECHOCARDIOGRAM (TEE)  Patient location during evaluation: Specials Recovery Anesthesia Type: General Level of consciousness: awake and alert Pain management: pain level controlled Vital Signs Assessment: post-procedure vital signs reviewed and stable Respiratory status: spontaneous breathing, nonlabored ventilation, respiratory function stable and patient connected to nasal cannula oxygen Cardiovascular status: blood pressure returned to baseline and stable Postop Assessment: no apparent nausea or vomiting Anesthetic complications: no   No notable events documented.   Last Vitals:  Vitals:   08/08/23 0825 08/08/23 0830  BP: 108/64 108/66  Pulse: 81 77  Resp: 20 (!) 21  Temp:    SpO2: 96% 97%    Last Pain:  Vitals:   08/08/23 0750  TempSrc: Oral  PainSc: 0-No pain                 Corinda Gubler

## 2023-08-08 NOTE — Progress Notes (Signed)
Inpatient Rehabilitation Admissions Coordinator   I have begun Auth with [facility] for possible CIR admit.  Ottie Glazier, RN, MSN Rehab Admissions Coordinator [phone] 08/08/2023 11:03 AM

## 2023-08-08 NOTE — Plan of Care (Signed)

## 2023-08-09 ENCOUNTER — Other Ambulatory Visit: Payer: Self-pay

## 2023-08-09 DIAGNOSIS — G9340 Encephalopathy, unspecified: Secondary | ICD-10-CM | POA: Diagnosis not present

## 2023-08-09 DIAGNOSIS — I6310 Cerebral infarction due to embolism of unspecified precerebral artery: Secondary | ICD-10-CM

## 2023-08-09 DIAGNOSIS — I38 Endocarditis, valve unspecified: Secondary | ICD-10-CM

## 2023-08-09 DIAGNOSIS — I63419 Cerebral infarction due to embolism of unspecified middle cerebral artery: Secondary | ICD-10-CM | POA: Diagnosis not present

## 2023-08-09 DIAGNOSIS — A419 Sepsis, unspecified organism: Secondary | ICD-10-CM | POA: Diagnosis not present

## 2023-08-09 LAB — CBC
HCT: 43.7 % (ref 39.0–52.0)
Hemoglobin: 15.2 g/dL (ref 13.0–17.0)
MCH: 33.8 pg (ref 26.0–34.0)
MCHC: 34.8 g/dL (ref 30.0–36.0)
MCV: 97.1 fL (ref 80.0–100.0)
Platelets: 317 10*3/uL (ref 150–400)
RBC: 4.5 MIL/uL (ref 4.22–5.81)
RDW: 11.6 % (ref 11.5–15.5)
WBC: 10.1 10*3/uL (ref 4.0–10.5)
nRBC: 0 % (ref 0.0–0.2)

## 2023-08-09 LAB — GLUCOSE, CAPILLARY
Glucose-Capillary: 183 mg/dL — ABNORMAL HIGH (ref 70–99)
Glucose-Capillary: 171 mg/dL — ABNORMAL HIGH (ref 70–99)
Glucose-Capillary: 184 mg/dL — ABNORMAL HIGH (ref 70–99)
Glucose-Capillary: 285 mg/dL — ABNORMAL HIGH (ref 70–99)
Glucose-Capillary: 268 mg/dL — ABNORMAL HIGH (ref 70–99)
Glucose-Capillary: 227 mg/dL — ABNORMAL HIGH (ref 70–99)
Glucose-Capillary: 194 mg/dL — ABNORMAL HIGH (ref 70–99)

## 2023-08-09 LAB — BASIC METABOLIC PANEL
Anion gap: 9 (ref 5–15)
BUN: 27 mg/dL — ABNORMAL HIGH (ref 8–23)
CO2: 24 mmol/L (ref 22–32)
Calcium: 8 mg/dL — ABNORMAL LOW (ref 8.9–10.3)
Chloride: 101 mmol/L (ref 98–111)
Creatinine, Ser: 0.83 mg/dL (ref 0.61–1.24)
GFR, Estimated: >60 mL/min (ref >60)
Glucose, Bld: 186 mg/dL — ABNORMAL HIGH (ref 70–99)
Potassium: 3.5 mmol/L (ref 3.5–5.1)
Sodium: 134 mmol/L — ABNORMAL LOW (ref 135–145)

## 2023-08-09 LAB — SEDIMENTATION RATE: Sed Rate: 27 mm/h — ABNORMAL HIGH (ref 0–20)

## 2023-08-09 LAB — MAGNESIUM: Magnesium: 1.9 mg/dL (ref 1.7–2.4)

## 2023-08-09 LAB — ECHO TEE

## 2023-08-09 LAB — C-REACTIVE PROTEIN: CRP: 3 mg/dL — ABNORMAL HIGH (ref <1.0)

## 2023-08-09 MED ORDER — INSULIN ASPART 100 UNIT/ML IJ SOLN
0.0000 [IU] | Freq: Three times a day (TID) | INTRAMUSCULAR | Status: DC
  Administered 2023-08-09 (×2): 5 [IU] via SUBCUTANEOUS
  Administered 2023-08-10: 3 [IU] via SUBCUTANEOUS
  Administered 2023-08-10: 2 [IU] via SUBCUTANEOUS
  Administered 2023-08-10: 5 [IU] via SUBCUTANEOUS
  Administered 2023-08-11: 2 [IU] via SUBCUTANEOUS
  Filled 2023-08-09 (×6): qty 1

## 2023-08-09 MED ORDER — TIMOLOL MALEATE 0.5 % OP SOLN
1.0000 [drp] | Freq: Every day | OPHTHALMIC | Status: DC
  Administered 2023-08-10 – 2023-08-11 (×2): 1 [drp] via OPHTHALMIC
  Filled 2023-08-09: qty 5

## 2023-08-09 MED ORDER — ASPIRIN 81 MG PO CHEW
81.0000 mg | CHEWABLE_TABLET | Freq: Every day | ORAL | Status: DC
  Administered 2023-08-09 – 2023-08-11 (×3): 81 mg via ORAL
  Filled 2023-08-09 (×3): qty 1

## 2023-08-09 MED ORDER — VANCOMYCIN HCL 1250 MG/250ML IV SOLN
1250.0000 mg | Freq: Two times a day (BID) | INTRAVENOUS | Status: DC
  Administered 2023-08-10 – 2023-08-11 (×3): 1250 mg via INTRAVENOUS
  Filled 2023-08-09 (×4): qty 250

## 2023-08-09 MED ORDER — INSULIN GLARGINE-YFGN 100 UNIT/ML ~~LOC~~ SOLN
5.0000 [IU] | Freq: Every day | SUBCUTANEOUS | Status: DC
  Administered 2023-08-09 – 2023-08-10 (×2): 5 [IU] via SUBCUTANEOUS
  Filled 2023-08-09 (×2): qty 0.05

## 2023-08-09 MED ORDER — VANCOMYCIN HCL 1750 MG/350ML IV SOLN
1750.0000 mg | Freq: Once | INTRAVENOUS | Status: AC
  Administered 2023-08-09: 1750 mg via INTRAVENOUS
  Filled 2023-08-09: qty 350

## 2023-08-09 NOTE — Plan of Care (Signed)
  Problem: Coping: Goal: Ability to adjust to condition or change in health will improve Outcome: Progressing   Problem: Nutritional: Goal: Maintenance of adequate nutrition will improve Outcome: Progressing   Problem: Respiratory: Goal: Ability to maintain adequate ventilation will improve Outcome: Progressing

## 2023-08-09 NOTE — Progress Notes (Signed)
Inpatient Rehabilitation Admissions Coordinator   I have received insurance approval for Cir and will have bed available either Thursday or Friday pending Dr Helayne Seminole clearance to discharge to Cir. I contacted pt's wife and daughter by phone at his bedside and they are aware and in agreement. Acute team and TOC made aware.  Ottie Glazier, RN, MSN Rehab Admissions Coordinator [phone] 08/09/2023 4:12 PM

## 2023-08-09 NOTE — Progress Notes (Signed)
Progress Note    Jeremy Valencia  WUJ:[id number] DOB: [birth year]  DOA: 08/01/2023 PCP: Lynnea Ferrier, MD      Brief Narrative:    Medical records reviewed and are as summarized below:  Jeremy Valencia is a 82 y.o. male with medical history significant for COPD (emphysema) type 2 diabetes mellitus, hypertension,, previous hospitalization for sepsis of unknown etiology in 2019 and 2023, history of UTIs, who presented to the hospital with confusion, lethargy chills and nausea.   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       Assessment/Plan:   Principal Problem:   Sepsis (HCC) Active Problems:   Acute metabolic encephalopathy   Essential (primary) hypertension   Type 2 diabetes mellitus (HCC)   Chronic obstructive pulmonary disease (COPD) (HCC)    Body mass index is 27.4 kg/m.   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   Fungitell, Bartonella antibody, Q fever antibody and Karius tests are pending.  Follow-up with ID for further recommendations. Blood cultures were negative.  TEE on 09/04/2023 which showed a large mobile vegetation on posterior leaflet of mitral valve   Acute stroke: CT head showed bilateral parietal cortical infarcts, right frontal lobe infarct.  Continue aspirin, Plavix and Lipitor. 2D echo showed EF estimated 55 to 60%, grade 1 diastolic dysfunction. PT and OT recommended acute rehab.   Acute metabolic encephalopathy: Improved   COPD: Stable   Type II DM with hyperglycemia: Start Semglee 5 units daily. NovoLog as needed for hyperglycemia.  Hemoglobin A1c 7.3. Lipid panel on 02/02/2023 showed LDL 51, HDL 35.8, total cholesterol 138 and triglycerides 256   Hypertension: Continue antihypertensives   Mild hyponatremia: Asymptomatic  Diet Order             DIET DYS 3 Fluid consistency: Thin  Diet effective now                             Consultants: Cardiologist ID specialist  Procedures: TEE    Medications:    aspirin  81 mg Oral Daily   atorvastatin  20 mg Oral Daily   clopidogrel  75 mg Oral Daily   enoxaparin (LOVENOX) injection  40 mg Subcutaneous Q24H   feeding supplement  237 mL Oral BID BM   hydrALAZINE  25 mg Oral BID   hydrocortisone cream   Topical BID   insulin aspart  0-9 Units Subcutaneous TID WC   insulin glargine-yfgn  5 Units Subcutaneous Daily   lisinopril  20 mg Oral Daily   multivitamin with minerals  1 tablet Oral Daily   timolol  1 drop Both Eyes Daily   Continuous Infusions:  cefTRIAXone (ROCEPHIN)  IV 2 g (08/08/23 1545)     Anti-infectives (From admission, onward)    Start     Dose/Rate Route Frequency Ordered Stop   08/07/23 1500  cefTRIAXone (ROCEPHIN) 2 g in sodium chloride 0.9 % 100 mL IVPB        2 g 200 mL/hr over 30 Minutes Intravenous Every 24 hours 08/07/23 1349     08/07/23 1500  doxycycline (VIBRAMYCIN) 100 mg in dextrose 5 % 250 mL IVPB  Status:  Discontinued        100 mg 125 mL/hr over 120 Minutes Intravenous Every 12 hours 08/07/23 1349 08/08/23 2001   08/05/23 1800  doxycycline (VIBRA-TABS) tablet 100 mg  Status:  Discontinued  100 mg Oral Every 12 hours 08/05/23 0818 08/06/23 1318   08/03/23 1800  cefTRIAXone (ROCEPHIN) 2 g in sodium chloride 0.9 % 100 mL IVPB  Status:  Discontinued        2 g 200 mL/hr over 30 Minutes Intravenous Every 24 hours 08/03/23 0907 08/06/23 1318   08/03/23 1600  ceFEPIme (MAXIPIME) 2 g in sodium chloride 0.9 % 100 mL IVPB  Status:  Discontinued        2 g 200 mL/hr over 30 Minutes Intravenous Every 8 hours 08/03/23 0905 08/03/23 0907   08/03/23 1500  doxycycline (VIBRAMYCIN) 100 mg in dextrose 5 % 250 mL IVPB  Status:  Discontinued        100 mg 125 mL/hr over 120 Minutes Intravenous Every 12 hours 08/03/23 1418 08/05/23 0818   08/02/23 1000  metroNIDAZOLE (FLAGYL) IVPB 500 mg  Status:  Discontinued         500 mg 100 mL/hr over 60 Minutes Intravenous Every 12 hours 08/02/23 0152 08/03/23 1417   08/02/23 1000  vancomycin (VANCOREADY) IVPB 1750 mg/350 mL  Status:  Discontinued        1,750 mg 175 mL/hr over 120 Minutes Intravenous Every 24 hours 08/02/23 0346 08/03/23 1417   08/02/23 0800  ceFEPIme (MAXIPIME) 2 g in sodium chloride 0.9 % 100 mL IVPB  Status:  Discontinued        2 g 200 mL/hr over 30 Minutes Intravenous Every 12 hours 08/02/23 0346 08/03/23 0905   08/02/23 0100  vancomycin (VANCOCIN) IVPB 1000 mg/200 mL premix        1,000 mg 200 mL/hr over 60 Minutes Intravenous  Once 08/02/23 0047 08/02/23 0303   08/02/23 0100  metroNIDAZOLE (FLAGYL) IVPB 500 mg        500 mg 100 mL/hr over 60 Minutes Intravenous  Once 08/02/23 0047 08/02/23 0303   08/01/23 2215  cefTRIAXone (ROCEPHIN) 2 g in sodium chloride 0.9 % 100 mL IVPB  Status:  Discontinued        2 g 200 mL/hr over 30 Minutes Intravenous Every 24 hours 08/01/23 2207 08/02/23 0346              Family Communication/Anticipated D/C date and plan/Code Status   DVT prophylaxis: enoxaparin (LOVENOX) injection 40 mg Start: 08/02/23 2200     Code Status: Full Code  Family Communication: None Disposition Plan: Plan to discharge to acute inpatient rehab   Status is: Inpatient Remains inpatient appropriate because: Infective endocarditis       Subjective:   Interval: Events noted.  He has no complaints.  Objective:    Vitals:   08/08/23 0909 08/08/23 1619 08/08/23 2006 08/09/23 0813  BP: 127/68 115/74 135/73 134/77  Pulse: 85 98 88 90  Resp: 18 18 16 16   Temp: 98.1 F (36.7 C) 97.9 F (36.6 C) 98.8 F (37.1 C) 97.7 F (36.5 C)  TempSrc: Oral  Oral Oral  SpO2: 96%  95% 95%  Weight:      Height:       No data found.   Intake/Output Summary (Last 24 hours) at 08/09/2023 1200 Last data filed at 08/09/2023 0401 Gross per 24 hour  Intake 100 ml  Output 1450 ml  Net -1350 ml   Filed Weights    08/02/23 0336  Weight: 94.2 kg    Exam:  GEN: NAD SKIN: Warm and dry EYES: EOMI, PERRLA, no pallor or icterus ENT: MMM CV: RRR PULM: CTA B ABD: soft, ND, NT, +BS CNS:  AAO x 3, RUE-3/5, RLE-4/5 EXT: No edema or tenderness        Data Reviewed:   I have personally reviewed following labs and imaging studies:  Labs: Labs show the following:   Basic Metabolic Panel: Recent Labs  Lab 08/04/23 0536 08/05/23 0453 08/06/23 0441 08/07/23 0343 08/08/23 0443 08/09/23 0354  NA 136 135 136 136  --  134*  K 3.5 3.4* 3.4* 3.6  --  3.5  CL 103 102 102 105  --  101  CO2 22 21* 24 23  --  24  GLUCOSE 136* 126* 129* 170*  --  186*  BUN 23 21 25* 29*  --  27*  CREATININE 0.92 0.78 0.70 0.79  --  0.83  CALCIUM 8.1* 8.2* 8.3* 8.3*  --  8.0*  MG  --   --   --  2.0 1.9 1.9   GFR Estimated Creatinine Clearance: 74.9 mL/min (by C-G formula based on SCr of 0.83 mg/dL). Liver Function Tests: No results for input(s): "AST", "ALT", "ALKPHOS", "BILITOT", "PROT", "ALBUMIN" in the last 168 hours. No results for input(s): "LIPASE", "AMYLASE" in the last 168 hours. No results for input(s): "AMMONIA" in the last 168 hours. Coagulation profile Recent Labs  Lab 08/03/23 0021  INR 1.1    CBC: Recent Labs  Lab 08/04/23 0536 08/05/23 0453 08/06/23 0441 08/07/23 0343 08/09/23 0354  WBC 11.9* 12.0* 9.8 10.6* 10.1  HGB 14.9 15.2 15.7 15.3 15.2  HCT 42.5 43.1 44.3 44.2 43.7  MCV 95.5 94.7 94.9 95.1 97.1  PLT 212 234 253 296 317   Cardiac Enzymes: No results for input(s): "CKTOTAL", "CKMB", "CKMBINDEX", "TROPONINI" in the last 168 hours. BNP (last 3 results) No results for input(s): "PROBNP" in the last 8760 hours. CBG: Recent Labs  Lab 08/08/23 2007 08/09/23 0103 08/09/23 0404 08/09/23 0815 08/09/23 1138  GLUCAP 337* 183* 171* 184* 285*   D-Dimer: No results for input(s): "DDIMER" in the last 72 hours. Hgb A1c: No results for input(s): "HGBA1C" in the last 72  hours. Lipid Profile: No results for input(s): "CHOL", "HDL", "LDLCALC", "TRIG", "CHOLHDL", "LDLDIRECT" in the last 72 hours. Thyroid function studies: No results for input(s): "TSH", "T4TOTAL", "T3FREE", "THYROIDAB" in the last 72 hours.  Invalid input(s): "FREET3" Anemia work up: No results for input(s): "VITAMINB12", "FOLATE", "FERRITIN", "TIBC", "IRON", "RETICCTPCT" in the last 72 hours. Sepsis Labs: Recent Labs  Lab 08/05/23 0453 08/06/23 0441 08/07/23 0343 08/09/23 0354  WBC 12.0* 9.8 10.6* 10.1    Microbiology Recent Results (from the past 240 hour(s))  Resp panel by RT-PCR (RSV, Flu A&B, Covid) Anterior Nasal Swab     Status: None   Collection Time: 08/01/23 10:09 PM   Specimen: Anterior Nasal Swab  Result Value Ref Range Status   SARS Coronavirus 2 by RT PCR NEGATIVE NEGATIVE Final    Comment: (NOTE) SARS-CoV-2 target nucleic acids are NOT DETECTED.  The SARS-CoV-2 RNA is generally detectable in upper respiratory specimens during the acute phase of infection. The lowest concentration of SARS-CoV-2 viral copies this assay can detect is 138 copies/mL. A negative result does not preclude SARS-Cov-2 infection and should not be used as the sole basis for treatment or other patient management decisions. A negative result may occur with  improper specimen collection/handling, submission of specimen other than nasopharyngeal swab, presence of viral mutation(s) within the areas targeted by this assay, and inadequate number of viral copies(<138 copies/mL). A negative result must be combined with clinical observations, patient history, and epidemiological information.  The expected result is Negative.  Fact Sheet for Patients:  BloggerCourse.com  Fact Sheet for Healthcare Providers:  SeriousBroker.it  This test is no t yet approved or cleared by the [place] FDA and  has been authorized for detection and/or diagnosis  of SARS-CoV-2 by FDA under an Emergency Use Authorization (EUA). This EUA will remain  in effect (meaning this test can be used) for the duration of the COVID-19 declaration under Section 564(b)(1) of the Act, 21 U.S.C.section 360bbb-3(b)(1), unless the authorization is terminated  or revoked sooner.       Influenza A by PCR NEGATIVE NEGATIVE Final   Influenza B by PCR NEGATIVE NEGATIVE Final    Comment: (NOTE) The Xpert Xpress SARS-CoV-2/FLU/RSV plus assay is intended as an aid in the diagnosis of influenza from Nasopharyngeal swab specimens and should not be used as a sole basis for treatment. Nasal washings and aspirates are unacceptable for Xpert Xpress SARS-CoV-2/FLU/RSV testing.  Fact Sheet for Patients: BloggerCourse.com  Fact Sheet for Healthcare Providers: SeriousBroker.it  This test is not yet approved or cleared by the [place] FDA and has been authorized for detection and/or diagnosis of SARS-CoV-2 by FDA under an Emergency Use Authorization (EUA). This EUA will remain in effect (meaning this test can be used) for the duration of the COVID-19 declaration under Section 564(b)(1) of the Act, 21 U.S.C. section 360bbb-3(b)(1), unless the authorization is terminated or revoked.     Resp Syncytial Virus by PCR NEGATIVE NEGATIVE Final    Comment: (NOTE) Fact Sheet for Patients: BloggerCourse.com  Fact Sheet for Healthcare Providers: SeriousBroker.it  This test is not yet approved or cleared by the [place] FDA and has been authorized for detection and/or diagnosis of SARS-CoV-2 by FDA under an Emergency Use Authorization (EUA). This EUA will remain in effect (meaning this test can be used) for the duration of the COVID-19 declaration under Section 564(b)(1) of the Act, 21 U.S.C. section 360bbb-3(b)(1), unless the authorization is terminated  or revoked.  Performed at [facility], [address] [place], [place] [address]   Blood Culture (routine x 2)     Status: None   Collection Time: 08/01/23 10:09 PM   Specimen: BLOOD  Result Value Ref Range Status   Specimen Description BLOOD RIGHT ARM  Final   Special Requests   Final    BOTTLES DRAWN AEROBIC AND ANAEROBIC Blood Culture adequate volume   Culture   Final    NO GROWTH 5 DAYS Performed at [facility], [address] [place], [place] [address]    Report Status 08/06/2023 FINAL  Final  Blood Culture (routine x 2)     Status: None   Collection Time: 08/01/23 10:09 PM   Specimen: BLOOD  Result Value Ref Range Status   Specimen Description BLOOD LEFT WRIST  Final   Special Requests   Final    BOTTLES DRAWN AEROBIC AND ANAEROBIC Blood Culture adequate volume   Culture   Final    NO GROWTH 5 DAYS Performed at [facility], [address], [place], [place] [address]    Report Status 08/06/2023 FINAL  Final  Culture, blood (Routine X 2) w Reflex to ID Panel     Status: None (Preliminary result)   Collection Time: 08/06/23  5:42 PM   Specimen: BLOOD  Result Value Ref Range Status   Specimen Description BLOOD BLOOD LEFT ARM  Final   Special Requests   Final    BOTTLES DRAWN AEROBIC ONLY Blood Culture  results may not be optimal due to an inadequate volume of blood received in culture bottles   Culture   Final    NO GROWTH 3 DAYS Performed at [facility], [address] [place], [place] [address]    Report Status PENDING  Incomplete  Culture, blood (Routine X 2) w Reflex to ID Panel     Status: None (Preliminary result)   Collection Time: 08/06/23  5:42 PM   Specimen: BLOOD  Result Value Ref Range Status   Specimen Description BLOOD BLOOD LEFT HAND  Final   Special Requests   Final    BOTTLES DRAWN AEROBIC AND ANAEROBIC Blood Culture results may not be optimal due to an excessive volume of blood received in culture  bottles   Culture   Final    NO GROWTH 3 DAYS Performed at [facility], [address] [place], [place] [address]    Report Status PENDING  Incomplete    Procedures and diagnostic studies:  No results found.             LOS: 7 days   Dianah Pruett  [facility]   Pager on [email or web address]. If 7PM-7AM, please contact night-coverage at [email or web address]     08/09/2023, 12:00 PM

## 2023-08-09 NOTE — Progress Notes (Signed)
Mobility Specialist - Progress Note   08/09/23 0954  Mobility  Activity Transferred from bed to chair;Stood at bedside  Level of Assistance Minimal assist, patient does 75% or more  Assistive Device  (R platform walker)  Distance Ambulated (ft) 4 ft  Activity Response Tolerated well  $Mobility charge 1 Mobility  Mobility Specialist Start Time (ACUTE ONLY) 0933  Mobility Specialist Stop Time (ACUTE ONLY) 0949  Mobility Specialist Time Calculation (min) (ACUTE ONLY) 16 min   Pt supine upon entry, utilizing RA. Pt agreeable to transfer to the recliner this date. Pt completed bed mob with MinA to bring trunk from sup to sit and to scoot towards EOB-- able to self suppor trunk upon transfer EOB. MS placed RUE on walker, unable to place indep d/t limited ROM. Pt STS to platform walker ModA, transferred to the recliner taking lateral and backwards steps-- MinA for RUE management on the walker d/t limited grip, Vcs for step progression. Pt left seated in the recliner with alarm set and needs within reach.  Zetta Bills Mobility Specialist 08/09/23 10:12 AM

## 2023-08-09 NOTE — Evaluation (Signed)
Speech Language Pathology Evaluation Patient Details Name: MIRAJ BONIFAS MRN: [id number] DOB: 1941/07/08 Today's Date: 08/09/2023 Time: 4098-1191 SLP Time Calculation (min) (ACUTE ONLY): 18 min  Problem List:  Patient Active Problem List   Diagnosis Date Noted   Sepsis (HCC) 08/02/2023   Acute metabolic encephalopathy 08/02/2023   Chronic obstructive pulmonary disease (COPD) (HCC) 08/02/2023   Overweight (BMI 25.0-29.9) 11/16/2021   Glaucoma 11/16/2021   Hyperlipidemia 11/16/2021   Constipation 11/16/2021   General weakness 11/15/2021   Acute encephalopathy 11/15/2021   Other emphysema (HCC) 01/22/2018   Benign essential tremor 01/18/2018   Insomnia, 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/18/2018   Tremor of hands and face 01/18/2018   Fever 10/26/2017   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   Persistent proteinuria 01/18/2016   Senile purpura (HCC) 01/18/2016   Type 2 diabetes mellitus (HCC) 06/19/2015   Microscopic hematuria 06/19/2015   Enlarged prostate 06/19/2015   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   Diabetes mellitus, type 2 (HCC) 03/13/2015   DD (diverticular disease) 03/13/2015   Acid reflux 03/13/2015   Chronic low back pain 01/08/2015   Essential (primary) hypertension 07/18/2014   Enthesopathy of ankle and tarsus 07/17/2014   Dupuytren's contracture of foot 07/17/2014   Capsulitis of right foot    Plantar fasciitis    Bursitis 05/16/2013   Past Medical History:  Past Medical History:  Diagnosis Date   Arthritis    BPH (benign prostatic hyperplasia)    Bursitis 05/16/2013   BURSITIS/CAPSULITIS RT ANKLE   Cancer (HCC)    skin cancer   Capsulitis of right foot 01/21/2013   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    Diabetes mellitus without complication (HCC)    Diverticulosis    Emphysema, unspecified (HCC)    GERD (gastroesophageal reflux disease)    Hyperlipidemia    Hypertension    Plantar fasciitis    Past Surgical History:  Past Surgical  History:  Procedure Laterality Date   APPENDECTOMY     BACK SURGERY     CATARACT EXTRACTION Left    COLONOSCOPY     COLONOSCOPY WITH PROPOFOL N/A 04/06/2021   Procedure: COLONOSCOPY WITH PROPOFOL;  Surgeon: Regis Bill, MD;  Location: [facility] ENDOSCOPY;  Service: Endoscopy;  Laterality: N/A;  DM   FOOT SURGERY     LAMINECTOMY     right eye     ROTATOR CUFF REPAIR Right    TEE WITHOUT CARDIOVERSION N/A 08/08/2023   Procedure: TRANSESOPHAGEAL ECHOCARDIOGRAM (TEE);  Surgeon: Antonieta Iba, MD;  Location: [facility] ORS;  Service: Cardiovascular;  Laterality: N/A;   UPPER GI ENDOSCOPY     HPI:  SEVE SCHLANGEN is a 82 y.o. male with medical history significant for COPD (emphysema) type 2 diabetes mellitus, hypertension,, previous hospitalization for sepsis of unknown etiology in 2019 and 2023, history of UTIs, who presented to the hospital on 08/01/2023 with confusion, lethargy chills and nausea.  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 Pt also dx with infective endocarditis.   Assessment / Plan / Recommendation Clinical Impression  Pt presents with what appears to be a mild to moderate cognitive impairment related to site of lesion (right frontal lobe infarct). Specifically, pt with decreased tolerance of questions, deficits noted in selective attention, memory (working, short-term and prospective), semi-complex problem solving, insight into deficits and safety awareness. Verbal expression and language comprehension appear to be a strength for pt. Informal cognitive assessment supports the above findings as pt was oriented x 4, able  to answer basic and complex yes/no questions as well as follow basic 1-2 step directions. He was not able to follow multi-step directions (suspect d/t memory deficits., perform serial 7's, recall information from short verbal story or perform basic problem solving tasks. Throughout the course of this evaluation, he presents as  largely unaware of these deficits and remarked "they say that I have had a stroke" - indicating decreased awareness of stroke-related deficits. At this time, pt would benefit from continued ST services for restorative therapy and for caregiver education as pt;s wife voiced that she was "overwhelmed" by level of deficits.    SLP Assessment  SLP Recommendation/Assessment: Patient needs continued Speech Lanaguage Pathology Services SLP Visit Diagnosis: Attention and concentration deficit;Frontal lobe and executive function deficit;Cognitive communication deficit (R41.841) Attention and concentration deficit following: Cerebral infarction Frontal lobe and executive function deficit following: Cerebral infarction    Recommendations for follow up therapy are one component of a multi-disciplinary discharge planning process, led by the attending physician.  Recommendations may be updated based on patient status, additional functional criteria and insurance authorization.    Follow Up Recommendations  Acute inpatient rehab (3hours/day)    Assistance Recommended at Discharge  Frequent or constant Supervision/Assistance  Functional Status Assessment Patient has had a recent decline in their functional status and demonstrates the ability to make significant improvements in function in a reasonable and predictable amount of time.  Frequency and Duration min 2x/week  2 weeks      SLP Evaluation Cognition  Overall Cognitive Status: Impaired/Different from baseline Arousal/Alertness: Awake/alert Orientation Level: Oriented X4 Attention: Selective Selective Attention: Impaired Selective Attention Impairment: Verbal complex;Functional complex Memory: Impaired Memory Impairment: Retrieval deficit;Decreased short term memory;Prospective memory Decreased Short Term Memory: Verbal complex;Functional complex Awareness: Impaired Awareness Impairment: Intellectual impairment;Emergent impairment;Anticipatory  impairment Problem Solving: Impaired Problem Solving Impairment: Verbal complex;Functional complex Executive Function:  (requires further assessment, suspect some mild deficits) Behaviors:  (While pt was agreeable to assessment, he intermitently appeared with decreased patience) Safety/Judgment: Impaired       Comprehension  Auditory Comprehension Overall Auditory Comprehension: Appears within functional limits for tasks assessed Yes/No Questions: Within Functional Limits Commands: Impaired (multi-step directions impaired, suspect d/t memory deficits) Conversation: Simple Interfering Components: Attention;Working Radio broadcast assistant: Repetition Counsellor: Not tested Reading Comprehension Reading Status: Not tested    Expression Expression Primary Mode of Expression: Verbal Verbal Expression Overall Verbal Expression: Appears within functional limits for tasks assessed Initiation: No impairment Automatic Speech: Name;Social Response Level of Generative/Spontaneous Verbalization: Sentence Repetition: No impairment Naming: No impairment Pragmatics: No impairment Non-Verbal Means of Communication: Not applicable Written Expression Dominant Hand: Right Written Expression: Not tested   Oral / Motor  Oral Motor/Sensory Function Overall Oral Motor/Sensory Function: Within functional limits Motor Speech Overall Motor Speech: Appears within functional limits for tasks assessed Respiration: Within functional limits Phonation: Normal Resonance: Within functional limits Articulation: Within functional limitis Intelligibility: Intelligible Motor Planning: Witnin functional limits Motor Speech Errors: Not applicable           Lilian Fuhs B. Dreama Saa, M.S., CCC-SLP, Tree surgeon Certified Brain Injury Specialist [facility]  [facility] Rehabilitation Services Office [phone] Ascom  [phone] Fax [phone]

## 2023-08-09 NOTE — Progress Notes (Signed)
Pharmacy Antibiotic Note  Jeremy Valencia is a 82 y.o. male w/ PMH of COPD with emphysema, T2DM, hypertension, chronic UTIs, glaucoma admitted on 08/01/2023 with infection from unknown source.  Pharmacy has been consulted for vancomycin dosing. SCr has been < 1 and stable   Plan: start vancomycin 1750 mg IV x 1, than 1250 mg IV every 12 hours  Goal AUC 400-550. Expected AUC: 551 SCr used: 0.83 mg/dL Ke 1.610 h-1, R6/0 45.4U  Pharmacy will continue to follow and will adjust abx dosing whenever warranted.  Temp (24hrs), Avg:98.1 F (36.7 C), Min:97.7 F (36.5 C), Max:98.8 F (37.1 C)   Recent Labs  Lab 08/04/23 0536 08/05/23 0453 08/06/23 0441 08/07/23 0343 08/09/23 0354  WBC 11.9* 12.0* 9.8 10.6* 10.1  CREATININE 0.92 0.78 0.70 0.79 0.83    Estimated Creatinine Clearance: 74.9 mL/min (by C-G formula based on SCr of 0.83 mg/dL).    Allergies  Allergen Reactions   Metformin Diarrhea   Rofecoxib Other (See Comments)    Other reaction(s): Unknown   Lamisil [Terbinafine] Rash    Antimicrobials this admission:  11/27cefepime >> 11/28 11/27 metronidazole>> 11/28 11/28 doxycycline >> 12/03 11/28 ceftriaxone >> 11/30  12/02 >>  11/27 vancomycin >> 11/28  12/04 >>   Microbiology results: 11/26 BCx: NG final 12/01 Bcx NGTD  Thank you for allowing pharmacy to be a part of this patient's care.  Burnis Medin, PharmD, BCPS 08/09/2023 1:02 PM

## 2023-08-09 NOTE — Progress Notes (Signed)
Date of Admission:  08/01/2023     ID: Jeremy Valencia is a 82 y.o. male Principal Problem:   Sepsis (HCC) Active Problems:   Essential (primary) hypertension   Type 2 diabetes mellitus (HCC)   Acute metabolic encephalopathy   Chronic obstructive pulmonary disease (COPD) (HCC)    Subjective: Doing better Sat in chair More strength rt arm  Medications:   aspirin  81 mg Oral Daily   atorvastatin  20 mg Oral Daily   clopidogrel  75 mg Oral Daily   enoxaparin (LOVENOX) injection  40 mg Subcutaneous Q24H   feeding supplement  237 mL Oral BID BM   hydrALAZINE  25 mg Oral BID   hydrocortisone cream   Topical BID   insulin aspart  0-9 Units Subcutaneous TID WC   insulin glargine-yfgn  5 Units Subcutaneous Daily   lisinopril  20 mg Oral Daily   multivitamin with minerals  1 tablet Oral Daily   timolol  1 drop Both Eyes Daily    Objective: Vital signs in last 24 hours: Patient Vitals for the past 24 hrs:  BP Temp Temp src Pulse Resp SpO2  08/09/23 0813 134/77 97.7 F (36.5 C) Oral 90 16 95 %  08/08/23 2006 135/73 98.8 F (37.1 C) Oral 88 16 95 %  08/08/23 1619 115/74 97.9 F (36.6 C) -- 98 18 --      PHYSICAL EXAM:  General: Alert, cooperative, no distress, appears stated age.  Head: Normocephalic, without obvious abnormality, atraumatic. Eyes: rt eye prosthesis Lungs: b/l air entry Heart: Regular rate and rhythm, no murmur, rub or gallop. Abdomen: Soft, non-tender,not distended. Bowel sounds normal. No masses Extremities: atraumatic, no cyanosis. No edema. No clubbing Skin: No rashes or lesions. Or bruising Lymph: Cervical, supraclavicular normal. Neurologic: rt hemiparesis   Lab Results    Latest Ref Rng & Units 08/09/2023    3:54 AM 08/07/2023    3:43 AM 08/06/2023    4:41 AM  CBC  WBC 4.0 - 10.5 K/uL 10.1  10.6  9.8   Hemoglobin 13.0 - 17.0 g/dL 86.5  78.4  69.6   Hematocrit 39.0 - 52.0 % 43.7  44.2  44.3   Platelets 150 - 400 K/uL 317  296  253         Latest Ref Rng & Units 08/09/2023    3:54 AM 08/07/2023    3:43 AM 08/06/2023    4:41 AM  CMP  Glucose 70 - 99 mg/dL 295  284  132   BUN 8 - 23 mg/dL 27  29  25    Creatinine 0.61 - 1.24 mg/dL 4.40  1.02  7.25   Sodium 135 - 145 mmol/L 134  136  136   Potassium 3.5 - 5.1 mmol/L 3.5  3.6  3.4   Chloride 98 - 111 mmol/L 101  105  102   CO2 22 - 32 mmol/L 24  23  24    Calcium 8.9 - 10.3 mg/dL 8.0  8.3  8.3       Microbiology: [facility] 11/26 NG 12/2 NG    Assessment/Plan: Encephalopathy 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

## 2023-08-09 NOTE — Progress Notes (Signed)
Physical Therapy Treatment Patient Details Name: Jeremy Valencia MRN: [id number] DOB: [birth year] Today's Date: 08/09/2023   History of Present Illness Pt is an 82 year old male admitted with sepsis, AMS    PMH significant for COPD with emphysema, diabetes mellitus and hypertension being admitted with sepsis    PT Comments  Pt received in recliner with wife/son at bedside and agreed to PT session. Pt performed STS with the use of platform walker MinA, amb ModA with platform walker ~58ft prior to step pivot transfer to bed ModA, and completed bed mobility ModA+2 for BLE and trunk management. NT present during session and assisted with bed mobility. MaxA+2 necessary for bed repositioning with the use of chuck pads. VC necessary throughout session for pt to correct upright posture and for RW management. RUE management while using RW (2wheels) important when performing STS and during amb as pt's right hand presents with decreased strength and function. Pt required increased time to perform all mobility and presented with fatigue at the end of session especially within RUE. Pt tolerated Tx well and will continue to benefit from skilled PT sessions to improve strength, activity tolerance, and functional mobility to maximize safety/IND following D/C.    If plan is discharge home, recommend the following: A lot of help with walking and/or transfers;A lot of help with bathing/dressing/bathroom;Assistance with cooking/housework;Direct supervision/assist for financial management;Direct supervision/assist for medications management;Assist for transportation;Help with stairs or ramp for entrance;Supervision due to cognitive status   Can travel by private vehicle        Equipment Recommendations  Other (comment) (TBD at next facility)    Recommendations for Other Services       Precautions / Restrictions Precautions Precautions: Fall Restrictions Weight Bearing Restrictions: No     Mobility  Bed  Mobility Overal bed mobility: Needs Assistance Bed Mobility: Sit to Supine       Sit to supine: Mod assist, +2 for physical assistance   General bed mobility comments: Pt performed bed mobility ModA+2 for trunk and BLE management.    Transfers Overall transfer level: Needs assistance Equipment used: Right platform walker Transfers: Sit to/from Stand, Bed to chair/wheelchair/BSC Sit to Stand: Min assist   Step pivot transfers: Mod assist       General transfer comment: Pt performed STS with the use of platform walker MinA, and step pivot transfer ModA. VC necessary for upright posture and RW management with all transfers    Ambulation/Gait Ambulation/Gait assistance: Mod assist Gait Distance (Feet): 5 Feet Assistive device: Right platform walker Gait Pattern/deviations: Step-to pattern Gait velocity: decreased     General Gait Details: Pt amb with the use of platform walker ModA. VC necessary for posture and RW management.   Stairs             Wheelchair Mobility     Tilt Bed    Modified Rankin (Stroke Patients Only)       Balance Overall balance assessment: Needs assistance Sitting-balance support: Feet supported, Single extremity supported Sitting balance-Leahy Scale: Good     Standing balance support: Single extremity supported, During functional activity, Reliant on assistive device for balance Standing balance-Leahy Scale: Fair                              Cognition Arousal: Alert Behavior During Therapy: WFL for tasks assessed/performed Overall Cognitive Status: Impaired/Different from baseline Area of Impairment: Following commands, Safety/judgement  Following Commands: Follows one step commands with increased time Safety/Judgement: Decreased awareness of safety, Decreased awareness of deficits     General Comments: Pt pleasant and willing to participate in PT session.        Exercises       General Comments        Pertinent Vitals/Pain Pain Assessment Pain Assessment: No/denies pain    Home Living     Available Help at Discharge: Family;Available 24 hours/day Type of Home: House                  Prior Function            PT Goals (current goals can now be found in the care plan section) Acute Rehab PT Goals Patient Stated Goal: did not state PT Goal Formulation: With patient Time For Goal Achievement: 08/16/23 Potential to Achieve Goals: Fair Progress towards PT goals: Progressing toward goals    Frequency    Min 1X/week      PT Plan      Co-evaluation              AM-PAC PT "6 Clicks" Mobility   Outcome Measure  Help needed turning from your back to your side while in a flat bed without using bedrails?: A Lot Help needed moving from lying on your back to sitting on the side of a flat bed without using bedrails?: A Lot Help needed moving to and from a bed to a chair (including a wheelchair)?: A Lot Help needed standing up from a chair using your arms (e.g., wheelchair or bedside chair)?: Total Help needed to walk in hospital room?: Total Help needed climbing 3-5 steps with a railing? : Total 6 Click Score: 9    End of Session Equipment Utilized During Treatment: Gait belt Activity Tolerance: Patient tolerated treatment well;Patient limited by fatigue Patient left: in bed;with bed alarm set;with call bell/phone within reach;with family/visitor present Nurse Communication: Mobility status PT Visit Diagnosis: Unsteadiness on feet (R26.81);Muscle weakness (generalized) (M62.81);Difficulty in walking, not elsewhere classified (R26.2);Other abnormalities of gait and mobility (R26.89)     Time: 3220-2542 PT Time Calculation (min) (ACUTE ONLY): 17 min  Charges:    $Gait Training: 8-22 mins PT General Charges $$ ACUTE PT VISIT: 1 Visit                    Valisha Heslin Sauvignon Howard SPT, LAT, ATC  Simmie Garin Sauvignon-Howard 08/09/2023,  2:05 PM

## 2023-08-09 NOTE — Inpatient Diabetes Management (Signed)
Inpatient Diabetes Program Recommendations  AACE/ADA: New Consensus Statement on Inpatient Glycemic Control  Target Ranges:  Prepandial:   less than 140 mg/dL      Peak postprandial:   less than 180 mg/dL (1-2 hours)      Critically ill patients:  140 - 180 mg/dL    Latest Reference Range & Units 08/08/23 07:55 08/08/23 11:27 08/08/23 20:07 08/09/23 01:03 08/09/23 04:04 08/09/23 08:15  Glucose-Capillary 70 - 99 mg/dL 474 (H) 259 (H) 563 (H) 183 (H) 171 (H) 184 (H)   Review of Glycemic Control  Diabetes history: DM2 Outpatient Diabetes medications: Amaryl 4 mg QAM, Jardiance 10 mg daily Current orders for Inpatient glycemic control: Novolog 0-6 units Q4H   Inpatient Diabetes Program Recommendations:     Insulin: CBGs ranged from 171-337 mg/dl over past 24 hours. May want to consider ordering Semglee 5 units Q24H.  Thanks, Orlando Penner, RN, MSN, CDCES Diabetes Coordinator Inpatient Diabetes Program [phone] (Team Pager from 8am to 5pm)

## 2023-08-10 ENCOUNTER — Other Ambulatory Visit: Payer: Self-pay

## 2023-08-10 DIAGNOSIS — A419 Sepsis, unspecified organism: Secondary | ICD-10-CM | POA: Diagnosis not present

## 2023-08-10 DIAGNOSIS — I330 Acute and subacute infective endocarditis: Secondary | ICD-10-CM | POA: Diagnosis not present

## 2023-08-10 LAB — GLUCOSE, CAPILLARY
Glucose-Capillary: 175 mg/dL — ABNORMAL HIGH (ref 70–99)
Glucose-Capillary: 177 mg/dL — ABNORMAL HIGH (ref 70–99)
Glucose-Capillary: 199 mg/dL — ABNORMAL HIGH (ref 70–99)
Glucose-Capillary: 204 mg/dL — ABNORMAL HIGH (ref 70–99)
Glucose-Capillary: 220 mg/dL — ABNORMAL HIGH (ref 70–99)
Glucose-Capillary: 291 mg/dL — ABNORMAL HIGH (ref 70–99)

## 2023-08-10 LAB — BARTONELLA ANTIBODY PANEL
B Quintana IgM: NEGATIVE {titer}
B henselae IgG: NEGATIVE {titer}
B henselae IgM: NEGATIVE {titer}
B quintana IgG: NEGATIVE {titer}

## 2023-08-10 LAB — CREATININE, SERUM
Creatinine, Ser: 0.83 mg/dL (ref 0.61–1.24)
GFR, Estimated: >60 mL/min (ref >60)

## 2023-08-10 LAB — Q FEVER ANTIBODIES, IGG
Q Fever Phase I: NEGATIVE
Q Fever Phase II: NEGATIVE

## 2023-08-10 MED ORDER — CHLORHEXIDINE GLUCONATE CLOTH 2 % EX PADS
6.0000 | MEDICATED_PAD | Freq: Every day | CUTANEOUS | Status: DC
  Administered 2023-08-10 – 2023-08-11 (×2): 6 via TOPICAL

## 2023-08-10 MED ORDER — SODIUM CHLORIDE 0.9% FLUSH: 10.0000 mL | INTRAVENOUS | Status: DC | PRN

## 2023-08-10 MED ORDER — SODIUM CHLORIDE 0.9% FLUSH
10.0000 mL | Freq: Two times a day (BID) | INTRAVENOUS | Status: DC
  Administered 2023-08-10 – 2023-08-11 (×2): 10 mL

## 2023-08-10 MED ORDER — SODIUM CHLORIDE 0.9 % IV SOLN
2.0000 g | Freq: Two times a day (BID) | INTRAVENOUS | Status: DC
  Filled 2023-08-10: qty 20

## 2023-08-10 NOTE — Progress Notes (Signed)
Peripherally Inserted Central Catheter Placement  The IV Nurse has discussed with the patient and/or persons authorized to consent for the patient, the purpose of this procedure and the potential benefits and risks involved with this procedure.  The benefits include less needle sticks, lab draws from the catheter, and the patient may be discharged home with the catheter. Risks include, but not limited to, infection, bleeding, blood clot (thrombus formation), and puncture of an artery; nerve damage and irregular heartbeat and possibility to perform a PICC exchange if needed/ordered by physician.  Alternatives to this procedure were also discussed.  Bard Power PICC patient education guide, fact sheet on infection prevention and patient information card has been provided to patient /or left at bedside.  Consent signed by wife due to altered mental status.  PICC inserted by Reginia Forts, RN  PICC Placement Documentation  PICC Single Lumen 08/10/23 Left Basilic 47 cm 0 cm (Active)  Indication for Insertion or Continuance of Line Prolonged intravenous therapies 08/10/23 1520  Exposed Catheter (cm) 0 cm 08/10/23 1520  Site Assessment Clean, Dry, Intact 08/10/23 1520  Line Status Flushed;Saline locked;Blood return noted 08/10/23 1520  Dressing Type Transparent;Securing device 08/10/23 1520  Dressing Status Antimicrobial disc in place;Clean, Dry, Intact 08/10/23 1520  Line Care Connections checked and tightened 08/10/23 1520  Line Adjustment (NICU/IV Team Only) No 08/10/23 1520  Dressing Intervention New dressing;Adhesive placed at insertion site (IV team only) 08/10/23 1520  Dressing Change Due 08/17/23 08/10/23 1520       Jeremy Valencia, [name] 08/10/2023, 3:20 PM

## 2023-08-10 NOTE — Progress Notes (Addendum)
Inpatient Rehabilitation Admissions Coordinator   Notified by Claudia Pollock, RN that PICC can not be placed until after 3 pm today. We will hold admit to CIR at [facility] until Friday am.  Ottie Glazier, RN, MSN Rehab Admissions Coordinator [phone] 08/10/2023 1:01 PM  Noted PICC placed at 321. We will follow up in the am to arrange admit to [facility] AIR in the am. I spoke with wife by phone and she is aware and in agreement.  Ottie Glazier, RN, MSN Rehab Admissions Coordinator [phone] 08/10/2023 6:00 PM

## 2023-08-10 NOTE — Progress Notes (Signed)
Occupational Therapy Treatment Patient Details Name: LIAD MIU MRN: [id number] DOB: 02/25/1941 Today's Date: 08/10/2023   History of present illness Pt is an 82 year old male admitted with sepsis, AMS    PMH significant for COPD with emphysema, diabetes mellitus and hypertension being admitted with sepsis   OT comments  Mr Butzer was seen for OT treatment on this date. Upon arrival to room pt in bed, agreeable to tx. Pt requires MOD A exit bed and sit<>stand x2 trials with RW. Incontinent of stool, MAX A pericare standing, +2 for standing balance. MIN A x2 + RW for side steps along EOB ~5 ft. Reviewed seated and supine RUE HEP and completed therex as described below. Pt making good progress toward goals, will continue to follow POC. Discharge recommendation remains appropriate.       If plan is discharge home, recommend the following:  A lot of help with walking and/or transfers;A lot of help with bathing/dressing/bathroom;Assistance with cooking/housework;Direct supervision/assist for medications management;Assist for transportation;Supervision due to cognitive status;Help with stairs or ramp for entrance;Direct supervision/assist for financial management;Assistance with feeding   Equipment Recommendations  Other (comment) (defer)    Recommendations for Other Services      Precautions / Restrictions Precautions Precautions: Fall Restrictions Weight Bearing Restrictions: No       Mobility Bed Mobility Overal bed mobility: Needs Assistance Bed Mobility: Supine to Sit, Sit to Supine     Supine to sit: Mod assist, HOB elevated Sit to supine: Mod assist, +2 for physical assistance        Transfers Overall transfer level: Needs assistance Equipment used: Rolling walker (2 wheels) Transfers: Sit to/from Stand Sit to Stand: Mod assist           General transfer comment: from low height     Balance Overall balance assessment: Needs assistance Sitting-balance support:  Feet supported, Single extremity supported Sitting balance-Leahy Scale: Good     Standing balance support: Single extremity supported, During functional activity, Reliant on assistive device for balance Standing balance-Leahy Scale: Fair                             ADL either performed or assessed with clinical judgement   ADL Overall ADL's : Needs assistance/impaired                                       General ADL Comments: MAX A pericare in standing.      Cognition Arousal: Alert Behavior During Therapy: WFL for tasks assessed/performed Overall Cognitive Status: Impaired/Different from baseline Area of Impairment: Following commands, Safety/judgement                       Following Commands: Follows one step commands with increased time Safety/Judgement: Decreased awareness of safety, Decreased awareness of deficits     General Comments: improved affect and command following from prior session                   Pertinent Vitals/ Pain       Pain Assessment Pain Assessment: No/denies pain   Frequency  Min 1X/week        Progress Toward Goals  OT Goals(current goals can now be found in the care plan section)  Progress towards OT goals: Progressing toward goals  Acute Rehab OT Goals OT Goal Formulation:  With patient/family Time For Goal Achievement: 08/16/23 Potential to Achieve Goals: Good ADL Goals Pt Will Perform Grooming: with modified independence;sitting Pt Will Perform Lower Body Dressing: with modified independence;sit to/from stand Pt Will Transfer to Toilet: with modified independence;ambulating Pt Will Perform Toileting - Clothing Manipulation and hygiene: with modified independence;sit to/from stand Pt/caregiver will Perform Home Exercise Program: Increased ROM;Right Upper extremity;With written HEP provided  Plan      Co-evaluation                 AM-PAC OT "6 Clicks" Daily Activity      Outcome Measure   Help from another person eating meals?: A Little Help from another person taking care of personal grooming?: A Lot Help from another person toileting, which includes using toliet, bedpan, or urinal?: A Lot Help from another person bathing (including washing, rinsing, drying)?: A Lot Help from another person to put on and taking off regular upper body clothing?: A Lot Help from another person to put on and taking off regular lower body clothing?: A Lot 6 Click Score: 13    End of Session Equipment Utilized During Treatment: Rolling walker (2 wheels);Gait belt  OT Visit Diagnosis: Other abnormalities of gait and mobility (R26.89);Muscle weakness (generalized) (M62.81);Hemiplegia and hemiparesis Hemiplegia - Right/Left: Right Hemiplegia - dominant/non-dominant: Dominant Hemiplegia - caused by: Unspecified   Activity Tolerance Patient tolerated treatment well   Patient Left in bed;with call bell/phone within reach;with family/visitor present   Nurse Communication          Time: 1610-9604 OT Time Calculation (min): 19 min  Charges: OT General Charges $OT Visit: 1 Visit OT Treatments $Self Care/Home Management : 8-22 mins  Kathie Dike, M.S. OTR/L  08/10/23, 11:16 AM  ascom [phone]

## 2023-08-10 NOTE — Treatment Plan (Signed)
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 Baseline Creatinine <1    Allergies  Allergen Reactions   Metformin Diarrhea   Rofecoxib Other (See Comments)    Other reaction(s): Unknown   Lamisil [Terbinafine] Rash    OPAT Orders Discharge antibiotics: Ceftriaxone 2 grams IV Q 12 Vancomycin 1250mg  Q 12 Per pharmacy protocol  Aim for Vancomycin trough 15-20 (unless otherwise indicated) Duration: 6 weeks  End Date:09/12/23   [facility] Per Protocol:  Labs weekly on Monday while on IV antibiotics: _X_ CBC with differential _X_ CMP _X_ CRP _X_ ESR _X_ Vancomycin trough Labs on Thursday X Vanco trough X   Creatinine  _X_ Please pull PIC at completion of IV antibiotics Fax weekly lab results  promptly to [phone]  Clinic Follow Up Appt: 08/31/23 at 10.45AM   Call [phone] with any questions

## 2023-08-10 NOTE — Care Management Important Message (Signed)
Important Message  Patient Details  Name: Jeremy Valencia MRN: [id number] Date of Birth: [birth year]   Important Message Given:  Yes - Medicare IM     Verita Schneiders Jayleena Stille 08/10/2023, 2:29 PM

## 2023-08-10 NOTE — Progress Notes (Signed)
Inpatient Rehabilitation Admissions Coordinator   I have Auth and CIR bed to admit patient. Dr Myriam Forehand has confirmed ready to discharge to CIR at [facility] in [place] once PICC placed. I have notified acute team and TOC of need to verify when placed so that I can arrange transport pending admit today vs Friday.  Ottie Glazier, RN, MSN Rehab Admissions Coordinator [phone] 08/10/2023 10:17 AM

## 2023-08-10 NOTE — Progress Notes (Addendum)
Progress Note    [name]  KVQ:[id number] DOB: [birth year]  DOA: 08/01/2023 PCP: Lynnea Ferrier, MD      Brief Narrative:    Medical records reviewed and are as summarized below:  Jeremy Valencia is a 82 y.o. male with medical history significant for COPD (emphysema) type 2 diabetes mellitus, hypertension,, previous hospitalization for sepsis of unknown etiology in 2019 and 2023, history of UTIs, who presented to the hospital with confusion, lethargy chills and nausea.   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 Fungitell, Bartonella antibody, Q fever antibody and Karius tests are pending.  Follow-up with ID. Blood cultures were negative.  TEE on 09/04/2023 which showed a large mobile vegetation on posterior leaflet of mitral valve PICC line was ordered yesterday.  PICC line to be placed today.   Acute stroke: CT head showed bilateral parietal cortical infarcts, right frontal lobe infarct. Neurologist recommended that aspirin and Plavix be continued indefinitely.  Continue Lipitor.   2D echo showed EF estimated 55 to 60%, grade 1 diastolic dysfunction. PT and OT recommended acute rehab.   Acute metabolic encephalopathy: Improved   COPD: Stable   Type II DM with hyperglycemia: Continue scheduled Semglee and NovoLog as needed for hyperglycemia.  Hemoglobin A1c 7.3. Lipid panel on 02/02/2023 showed LDL 51, HDL 35.8, total cholesterol 138 and triglycerides  256   Hypertension: Continue antihypertensives   Mild hyponatremia: Asymptomatic   Plan of care was discussed with Jeremy Valencia, son, at the bedside. Plan to discharge patient to inpatient rehab tomorrow after placement of PICC line.   Diet Order             DIET DYS 3 Fluid consistency: Thin  Diet effective now                            Consultants: Cardiologist ID specialist Neurologist  Procedures: TEE    Medications:    aspirin  81 mg Oral Daily   atorvastatin  20 mg Oral Daily   clopidogrel  75 mg Oral Daily   enoxaparin (LOVENOX) injection  40 mg Subcutaneous Q24H   feeding supplement  237 mL Oral BID BM   hydrALAZINE  25 mg Oral BID   hydrocortisone cream   Topical BID   insulin aspart  0-9 Units Subcutaneous TID WC   insulin glargine-yfgn  5 Units Subcutaneous Daily   lisinopril  20 mg Oral Daily   multivitamin with minerals  1 tablet Oral Daily   timolol  1 drop Left Eye Daily   Continuous Infusions:  cefTRIAXone (ROCEPHIN)  IV 2 g (08/10/23 1436)   vancomycin 1,250 mg (08/10/23 0513)     Anti-infectives (From admission, onward)    Start     Dose/Rate Route Frequency Ordered Stop   08/10/23 0600  vancomycin (VANCOREADY) IVPB 1250 mg/250 mL        1,250 mg 166.7 mL/hr over 90 Minutes Intravenous Every  12 hours 08/09/23 1343     08/09/23 1400  vancomycin (VANCOREADY) IVPB 1750 mg/350 mL        1,750 mg 175 mL/hr over 120 Minutes Intravenous  Once 08/09/23 1308 08/10/23 0853   08/07/23 1500  cefTRIAXone (ROCEPHIN) 2 g in sodium chloride 0.9 % 100 mL IVPB        2 g 200 mL/hr over 30 Minutes Intravenous Every 24 hours 08/07/23 1349     08/07/23 1500  doxycycline (VIBRAMYCIN) 100 mg in dextrose 5 % 250 mL IVPB  Status:  Discontinued        100 mg 125 mL/hr over 120 Minutes Intravenous Every 12 hours 08/07/23 1349 08/08/23 2001   08/05/23 1800  doxycycline (VIBRA-TABS) tablet 100 mg  Status:  Discontinued        100 mg Oral Every 12 hours  08/05/23 0818 08/06/23 1318   08/03/23 1800  cefTRIAXone (ROCEPHIN) 2 g in sodium chloride 0.9 % 100 mL IVPB  Status:  Discontinued        2 g 200 mL/hr over 30 Minutes Intravenous Every 24 hours 08/03/23 0907 08/06/23 1318   08/03/23 1600  ceFEPIme (MAXIPIME) 2 g in sodium chloride 0.9 % 100 mL IVPB  Status:  Discontinued        2 g 200 mL/hr over 30 Minutes Intravenous Every 8 hours 08/03/23 0905 08/03/23 0907   08/03/23 1500  doxycycline (VIBRAMYCIN) 100 mg in dextrose 5 % 250 mL IVPB  Status:  Discontinued        100 mg 125 mL/hr over 120 Minutes Intravenous Every 12 hours 08/03/23 1418 08/05/23 0818   08/02/23 1000  metroNIDAZOLE (FLAGYL) IVPB 500 mg  Status:  Discontinued        500 mg 100 mL/hr over 60 Minutes Intravenous Every 12 hours 08/02/23 0152 08/03/23 1417   08/02/23 1000  vancomycin (VANCOREADY) IVPB 1750 mg/350 mL  Status:  Discontinued        1,750 mg 175 mL/hr over 120 Minutes Intravenous Every 24 hours 08/02/23 0346 08/03/23 1417   08/02/23 0800  ceFEPIme (MAXIPIME) 2 g in sodium chloride 0.9 % 100 mL IVPB  Status:  Discontinued        2 g 200 mL/hr over 30 Minutes Intravenous Every 12 hours 08/02/23 0346 08/03/23 0905   08/02/23 0100  vancomycin (VANCOCIN) IVPB 1000 mg/200 mL premix        1,000 mg 200 mL/hr over 60 Minutes Intravenous  Once 08/02/23 0047 08/02/23 0303   08/02/23 0100  metroNIDAZOLE (FLAGYL) IVPB 500 mg        500 mg 100 mL/hr over 60 Minutes Intravenous  Once 08/02/23 0047 08/02/23 0303   08/01/23 2215  cefTRIAXone (ROCEPHIN) 2 g in sodium chloride 0.9 % 100 mL IVPB  Status:  Discontinued        2 g 200 mL/hr over 30 Minutes Intravenous Every 24 hours 08/01/23 2207 08/02/23 0346              Family Communication/Anticipated D/C date and plan/Code Status   DVT prophylaxis: enoxaparin (LOVENOX) injection 40 mg Start: 08/02/23 2200     Code Status: Full Code  Family Communication: Jeremy Valencia, son, at the bedside Disposition Plan: Plan to  discharge to acute inpatient rehab   Status is: Inpatient Remains inpatient appropriate because: Infective endocarditis       Subjective:   Interval events noted.  He has no complaints.  He feels better.  He is moving his right side better  compared to yesterday.  Jeremy Valencia, son, was at the bedside  Objective:    Vitals:   08/09/23 2215 08/10/23 0339 08/10/23 0508 08/10/23 0820  BP: 131/80 127/64 130/68 (!) 147/73  Pulse: 76 77 86 80  Resp:  18  16  Temp: 98.3 F (36.8 C) 98 F (36.7 C) 97.7 F (36.5 C) 98.5 F (36.9 C)  TempSrc: Oral Oral Oral Oral  SpO2: 95% 92% 95% 95%  Weight:      Height:       No data found.   Intake/Output Summary (Last 24 hours) at 08/10/2023 1458 Last data filed at 08/10/2023 1000 Gross per 24 hour  Intake 687.16 ml  Output 900 ml  Net -212.84 ml   Filed Weights   08/02/23 0336  Weight: 94.2 kg    Exam:  GEN: NAD SKIN: Warm and dry EYES: No pallor or icterus ENT: MMM CV: RRR PULM: CTA B ABD: soft, ND, NT, +BS CNS: AAO x 3, Power RUE4/5, RLE-4+5 EXT: No edema or tenderness       Data Reviewed:   I have personally reviewed following labs and imaging studies:  Labs: Labs show the following:   Basic Metabolic Panel: Recent Labs  Lab 08/04/23 0536 08/05/23 0453 08/06/23 0441 08/07/23 0343 08/08/23 0443 08/09/23 0354 08/10/23 0543  NA 136 135 136 136  --  134*  --   K 3.5 3.4* 3.4* 3.6  --  3.5  --   CL 103 102 102 105  --  101  --   CO2 22 21* 24 23  --  24  --   GLUCOSE 136* 126* 129* 170*  --  186*  --   BUN 23 21 25* 29*  --  27*  --   CREATININE 0.92 0.78 0.70 0.79  --  0.83 0.83  CALCIUM 8.1* 8.2* 8.3* 8.3*  --  8.0*  --   MG  --   --   --  2.0 1.9 1.9  --    GFR Estimated Creatinine Clearance: 74.9 mL/min (by C-G formula based on SCr of 0.83 mg/dL). Liver Function Tests: No results for input(s): "AST", "ALT", "ALKPHOS", "BILITOT", "PROT", "ALBUMIN" in the last 168 hours. No results for input(s):  "LIPASE", "AMYLASE" in the last 168 hours. No results for input(s): "AMMONIA" in the last 168 hours. Coagulation profile No results for input(s): "INR", "PROTIME" in the last 168 hours.   CBC: Recent Labs  Lab 08/04/23 0536 08/05/23 0453 08/06/23 0441 08/07/23 0343 08/09/23 0354  WBC 11.9* 12.0* 9.8 10.6* 10.1  HGB 14.9 15.2 15.7 15.3 15.2  HCT 42.5 43.1 44.3 44.2 43.7  MCV 95.5 94.7 94.9 95.1 97.1  PLT 212 234 253 296 317   Cardiac Enzymes: No results for input(s): "CKTOTAL", "CKMB", "CKMBINDEX", "TROPONINI" in the last 168 hours. BNP (last 3 results) No results for input(s): "PROBNP" in the last 8760 hours. CBG: Recent Labs  Lab 08/09/23 2215 08/10/23 0019 08/10/23 0336 08/10/23 0821 08/10/23 1123  GLUCAP 194* 199* 177* 175* 291*   D-Dimer: No results for input(s): "DDIMER" in the last 72 hours. Hgb A1c: No results for input(s): "HGBA1C" in the last 72 hours. Lipid Profile: No results for input(s): "CHOL", "HDL", "LDLCALC", "TRIG", "CHOLHDL", "LDLDIRECT" in the last 72 hours. Thyroid function studies: No results for input(s): "TSH", "T4TOTAL", "T3FREE", "THYROIDAB" in the last 72 hours.  Invalid input(s): "FREET3" Anemia work up: No results for input(s): "VITAMINB12", "FOLATE", "FERRITIN", "TIBC", "IRON", "RETICCTPCT" in the last 72 hours. Sepsis Labs:  Recent Labs  Lab 08/05/23 0453 08/06/23 0441 08/07/23 0343 08/09/23 0354  WBC 12.0* 9.8 10.6* 10.1    Microbiology Recent Results (from the past 240 hour(s))  Resp panel by RT-PCR (RSV, Flu A&B, Covid) Anterior Nasal Swab     Status: None   Collection Time: 08/01/23 10:09 PM   Specimen: Anterior Nasal Swab  Result Value Ref Range Status   SARS Coronavirus 2 by RT PCR NEGATIVE NEGATIVE Final    Comment: (NOTE) SARS-CoV-2 target nucleic acids are NOT DETECTED.  The SARS-CoV-2 RNA is generally detectable in upper respiratory specimens during the acute phase of infection. The lowest concentration of  SARS-CoV-2 viral copies this assay can detect is 138 copies/mL. A negative result does not preclude SARS-Cov-2 infection and should not be used as the sole basis for treatment or other patient management decisions. A negative result may occur with  improper specimen collection/handling, submission of specimen other than nasopharyngeal swab, presence of viral mutation(s) within the areas targeted by this assay, and inadequate number of viral copies(<138 copies/mL). A negative result must be combined with clinical observations, patient history, and epidemiological information. The expected result is Negative.  Fact Sheet for Patients:  BloggerCourse.com  Fact Sheet for Healthcare Providers:  SeriousBroker.it  This test is no t yet approved or cleared by the [place] FDA and  has been authorized for detection and/or diagnosis of SARS-CoV-2 by FDA under an Emergency Use Authorization (EUA). This EUA will remain  in effect (meaning this test can be used) for the duration of the COVID-19 declaration under Section 564(b)(1) of the Act, 21 U.S.C.section 360bbb-3(b)(1), unless the authorization is terminated  or revoked sooner.       Influenza A by PCR NEGATIVE NEGATIVE Final   Influenza B by PCR NEGATIVE NEGATIVE Final    Comment: (NOTE) The Xpert Xpress SARS-CoV-2/FLU/RSV plus assay is intended as an aid in the diagnosis of influenza from Nasopharyngeal swab specimens and should not be used as a sole basis for treatment. Nasal washings and aspirates are unacceptable for Xpert Xpress SARS-CoV-2/FLU/RSV testing.  Fact Sheet for Patients: BloggerCourse.com  Fact Sheet for Healthcare Providers: SeriousBroker.it  This test is not yet approved or cleared by the [place] FDA and has been authorized for detection and/or diagnosis of SARS-CoV-2 by FDA under an Emergency Use  Authorization (EUA). This EUA will remain in effect (meaning this test can be used) for the duration of the COVID-19 declaration under Section 564(b)(1) of the Act, 21 U.S.C. section 360bbb-3(b)(1), unless the authorization is terminated or revoked.     Resp Syncytial Virus by PCR NEGATIVE NEGATIVE Final    Comment: (NOTE) Fact Sheet for Patients: BloggerCourse.com  Fact Sheet for Healthcare Providers: SeriousBroker.it  This test is not yet approved or cleared by the [place] FDA and has been authorized for detection and/or diagnosis of SARS-CoV-2 by FDA under an Emergency Use Authorization (EUA). This EUA will remain in effect (meaning this test can be used) for the duration of the COVID-19 declaration under Section 564(b)(1) of the Act, 21 U.S.C. section 360bbb-3(b)(1), unless the authorization is terminated or revoked.  Performed at [facility], [address] [place], [place] [address]   Blood Culture (routine x 2)     Status: None   Collection Time: 08/01/23 10:09 PM   Specimen: BLOOD  Result Value Ref Range Status   Specimen Description BLOOD RIGHT ARM  Final   Special Requests   Final    BOTTLES DRAWN AEROBIC AND  ANAEROBIC Blood Culture adequate volume   Culture   Final    NO GROWTH 5 DAYS Performed at [facility], [address] [place], [place] [address]    Report Status 08/06/2023 FINAL  Final  Blood Culture (routine x 2)     Status: None   Collection Time: 08/01/23 10:09 PM   Specimen: BLOOD  Result Value Ref Range Status   Specimen Description BLOOD LEFT WRIST  Final   Special Requests   Final    BOTTLES DRAWN AEROBIC AND ANAEROBIC Blood Culture adequate volume   Culture   Final    NO GROWTH 5 DAYS Performed at [facility], [address] [place], [place] [address]    Report Status 08/06/2023 FINAL  Final  Culture, blood (Routine X 2) w Reflex to ID Panel     Status:  None (Preliminary result)   Collection Time: 08/06/23  5:42 PM   Specimen: BLOOD  Result Value Ref Range Status   Specimen Description BLOOD BLOOD LEFT ARM  Final   Special Requests   Final    BOTTLES DRAWN AEROBIC ONLY Blood Culture results may not be optimal due to an inadequate volume of blood received in culture bottles   Culture   Final    NO GROWTH 3 DAYS Performed at [facility], [address], [place], [place] [address]    Report Status PENDING  Incomplete  Culture, blood (Routine X 2) w Reflex to ID Panel     Status: None (Preliminary result)   Collection Time: 08/06/23  5:42 PM   Specimen: BLOOD  Result Value Ref Range Status   Specimen Description BLOOD BLOOD LEFT HAND  Final   Special Requests   Final    BOTTLES DRAWN AEROBIC AND ANAEROBIC Blood Culture results may not be optimal due to an excessive volume of blood received in culture bottles   Culture   Final    NO GROWTH 3 DAYS Performed at [facility], [address] [place], [place] [address]    Report Status PENDING  Incomplete    Procedures and diagnostic studies:  [place] EKG SITE RITE  Result Date: 08/10/2023 If Site Rite image not attached, placement could not be confirmed due to current cardiac rhythm.  [place] EKG SITE RITE  Result Date: 08/09/2023 If Site Rite image not attached, placement could not be confirmed due to current cardiac rhythm.              LOS: 8 days   Sully Manzi  [facility]   Pager on [email or web address]. If 7PM-7AM, please contact night-coverage at [email or web address]     08/10/2023, 2:58 PM
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
## 2023-08-10 NOTE — Plan of Care (Signed)

## 2023-08-10 NOTE — Plan of Care (Signed)

## 2023-08-11 ENCOUNTER — Encounter (HOSPITAL_COMMUNITY): Payer: Self-pay | Admitting: Physical Medicine and Rehabilitation

## 2023-08-11 ENCOUNTER — Inpatient Hospital Stay (HOSPITAL_COMMUNITY)
Admission: AD | Admit: 2023-08-11 | Discharge: 2023-08-24 | DRG: 056 | Disposition: A | Discharge status: Skilled Nursing Facility | Payer: Medicare HMO | Source: Other Acute Inpatient Hospital | Attending: Physical Medicine and Rehabilitation | Admitting: Physical Medicine and Rehabilitation

## 2023-08-11 ENCOUNTER — Other Ambulatory Visit: Payer: Self-pay

## 2023-08-11 DIAGNOSIS — I6310 Cerebral infarction due to embolism of unspecified precerebral artery: Secondary | ICD-10-CM | POA: Diagnosis not present

## 2023-08-11 DIAGNOSIS — Z823 Family history of stroke: Secondary | ICD-10-CM

## 2023-08-11 DIAGNOSIS — R944 Abnormal results of kidney function studies: Secondary | ICD-10-CM | POA: Diagnosis present

## 2023-08-11 DIAGNOSIS — E119 Type 2 diabetes mellitus without complications: Secondary | ICD-10-CM | POA: Diagnosis present

## 2023-08-11 DIAGNOSIS — Z820 Family history of epilepsy and other diseases of the nervous system: Secondary | ICD-10-CM

## 2023-08-11 DIAGNOSIS — E1140 Type 2 diabetes mellitus with diabetic neuropathy, unspecified: Secondary | ICD-10-CM | POA: Diagnosis not present

## 2023-08-11 DIAGNOSIS — I69351 Hemiplegia and hemiparesis following cerebral infarction affecting right dominant side: Principal | ICD-10-CM

## 2023-08-11 DIAGNOSIS — I6381 Other cerebral infarction due to occlusion or stenosis of small artery: Secondary | ICD-10-CM | POA: Diagnosis not present

## 2023-08-11 DIAGNOSIS — Z85828 Personal history of other malignant neoplasm of skin: Secondary | ICD-10-CM | POA: Diagnosis not present

## 2023-08-11 DIAGNOSIS — E785 Hyperlipidemia, unspecified: Secondary | ICD-10-CM | POA: Diagnosis present

## 2023-08-11 DIAGNOSIS — R569 Unspecified convulsions: Secondary | ICD-10-CM

## 2023-08-11 DIAGNOSIS — I63312 Cerebral infarction due to thrombosis of left middle cerebral artery: Secondary | ICD-10-CM | POA: Diagnosis not present

## 2023-08-11 DIAGNOSIS — Z79899 Other long term (current) drug therapy: Secondary | ICD-10-CM | POA: Diagnosis not present

## 2023-08-11 DIAGNOSIS — Z7982 Long term (current) use of aspirin: Secondary | ICD-10-CM

## 2023-08-11 DIAGNOSIS — R41841 Cognitive communication deficit: Secondary | ICD-10-CM | POA: Diagnosis not present

## 2023-08-11 DIAGNOSIS — R4182 Altered mental status, unspecified: Secondary | ICD-10-CM | POA: Diagnosis not present

## 2023-08-11 DIAGNOSIS — L509 Urticaria, unspecified: Secondary | ICD-10-CM | POA: Diagnosis present

## 2023-08-11 DIAGNOSIS — I38 Endocarditis, valve unspecified: Secondary | ICD-10-CM | POA: Diagnosis not present

## 2023-08-11 DIAGNOSIS — L270 Generalized skin eruption due to drugs and medicaments taken internally: Secondary | ICD-10-CM | POA: Diagnosis not present

## 2023-08-11 DIAGNOSIS — M1811 Unilateral primary osteoarthritis of first carpometacarpal joint, right hand: Secondary | ICD-10-CM | POA: Diagnosis not present

## 2023-08-11 DIAGNOSIS — T361X5A Adverse effect of cephalosporins and other beta-lactam antibiotics, initial encounter: Secondary | ICD-10-CM | POA: Diagnosis not present

## 2023-08-11 DIAGNOSIS — H5461 Unqualified visual loss, right eye, normal vision left eye: Secondary | ICD-10-CM | POA: Diagnosis present

## 2023-08-11 DIAGNOSIS — R32 Unspecified urinary incontinence: Secondary | ICD-10-CM | POA: Diagnosis present

## 2023-08-11 DIAGNOSIS — I330 Acute and subacute infective endocarditis: Secondary | ICD-10-CM | POA: Diagnosis present

## 2023-08-11 DIAGNOSIS — G2581 Restless legs syndrome: Secondary | ICD-10-CM | POA: Diagnosis not present

## 2023-08-11 DIAGNOSIS — N400 Enlarged prostate without lower urinary tract symptoms: Secondary | ICD-10-CM | POA: Diagnosis present

## 2023-08-11 DIAGNOSIS — H409 Unspecified glaucoma: Secondary | ICD-10-CM | POA: Diagnosis present

## 2023-08-11 DIAGNOSIS — K219 Gastro-esophageal reflux disease without esophagitis: Secondary | ICD-10-CM | POA: Diagnosis present

## 2023-08-11 DIAGNOSIS — R21 Rash and other nonspecific skin eruption: Secondary | ICD-10-CM | POA: Diagnosis not present

## 2023-08-11 DIAGNOSIS — Z8042 Family history of malignant neoplasm of prostate: Secondary | ICD-10-CM

## 2023-08-11 DIAGNOSIS — J439 Emphysema, unspecified: Secondary | ICD-10-CM | POA: Diagnosis present

## 2023-08-11 DIAGNOSIS — Z87891 Personal history of nicotine dependence: Secondary | ICD-10-CM

## 2023-08-11 DIAGNOSIS — I10 Essential (primary) hypertension: Secondary | ICD-10-CM | POA: Diagnosis not present

## 2023-08-11 DIAGNOSIS — G9341 Metabolic encephalopathy: Secondary | ICD-10-CM | POA: Diagnosis present

## 2023-08-11 DIAGNOSIS — G4700 Insomnia, unspecified: Secondary | ICD-10-CM | POA: Diagnosis not present

## 2023-08-11 DIAGNOSIS — R1312 Dysphagia, oropharyngeal phase: Secondary | ICD-10-CM | POA: Diagnosis not present

## 2023-08-11 DIAGNOSIS — E1169 Type 2 diabetes mellitus with other specified complication: Secondary | ICD-10-CM | POA: Diagnosis not present

## 2023-08-11 DIAGNOSIS — I639 Cerebral infarction, unspecified: Principal | ICD-10-CM | POA: Insufficient documentation

## 2023-08-11 DIAGNOSIS — M109 Gout, unspecified: Secondary | ICD-10-CM | POA: Diagnosis present

## 2023-08-11 DIAGNOSIS — Z7902 Long term (current) use of antithrombotics/antiplatelets: Secondary | ICD-10-CM

## 2023-08-11 DIAGNOSIS — R2681 Unsteadiness on feet: Secondary | ICD-10-CM | POA: Diagnosis not present

## 2023-08-11 DIAGNOSIS — Z741 Need for assistance with personal care: Secondary | ICD-10-CM | POA: Diagnosis not present

## 2023-08-11 DIAGNOSIS — Z6825 Body mass index (BMI) 25.0-25.9, adult: Secondary | ICD-10-CM | POA: Diagnosis not present

## 2023-08-11 DIAGNOSIS — M25531 Pain in right wrist: Secondary | ICD-10-CM | POA: Diagnosis not present

## 2023-08-11 DIAGNOSIS — Z888 Allergy status to other drugs, medicaments and biological substances status: Secondary | ICD-10-CM

## 2023-08-11 DIAGNOSIS — Z970 Presence of artificial eye: Secondary | ICD-10-CM | POA: Diagnosis not present

## 2023-08-11 DIAGNOSIS — Z7401 Bed confinement status: Secondary | ICD-10-CM | POA: Diagnosis not present

## 2023-08-11 DIAGNOSIS — E663 Overweight: Secondary | ICD-10-CM | POA: Diagnosis present

## 2023-08-11 DIAGNOSIS — Z7984 Long term (current) use of oral hypoglycemic drugs: Secondary | ICD-10-CM

## 2023-08-11 DIAGNOSIS — Z794 Long term (current) use of insulin: Secondary | ICD-10-CM | POA: Diagnosis not present

## 2023-08-11 DIAGNOSIS — Z8249 Family history of ischemic heart disease and other diseases of the circulatory system: Secondary | ICD-10-CM

## 2023-08-11 DIAGNOSIS — A419 Sepsis, unspecified organism: Secondary | ICD-10-CM | POA: Diagnosis not present

## 2023-08-11 DIAGNOSIS — M6259 Muscle wasting and atrophy, not elsewhere classified, multiple sites: Secondary | ICD-10-CM | POA: Diagnosis not present

## 2023-08-11 LAB — COMPREHENSIVE METABOLIC PANEL
ALT: 37 U/L (ref 0–44)
AST: 24 U/L (ref 15–41)
Albumin: 2.6 g/dL — ABNORMAL LOW (ref 3.5–5.0)
Alkaline Phosphatase: 54 U/L (ref 38–126)
Anion gap: 9 (ref 5–15)
BUN: 15 mg/dL (ref 8–23)
CO2: 25 mmol/L (ref 22–32)
Calcium: 8.6 mg/dL — ABNORMAL LOW (ref 8.9–10.3)
Chloride: 101 mmol/L (ref 98–111)
Creatinine, Ser: 0.82 mg/dL (ref 0.61–1.24)
GFR, Estimated: >60 mL/min (ref >60)
Glucose, Bld: 207 mg/dL — ABNORMAL HIGH (ref 70–99)
Potassium: 4.1 mmol/L (ref 3.5–5.1)
Sodium: 135 mmol/L (ref 135–145)
Total Bilirubin: 0.6 mg/dL (ref <1.2)
Total Protein: 5.8 g/dL — ABNORMAL LOW (ref 6.5–8.1)

## 2023-08-11 LAB — CULTURE, BLOOD (ROUTINE X 2)
Culture: NO GROWTH
Culture: NO GROWTH

## 2023-08-11 LAB — CBC WITH DIFFERENTIAL/PLATELET
Abs Immature Granulocytes: 0.07 10*3/uL (ref 0.00–0.07)
Basophils Absolute: 0.1 10*3/uL (ref 0.0–0.1)
Basophils Relative: 1 %
Eosinophils Absolute: 0.6 10*3/uL — ABNORMAL HIGH (ref 0.0–0.5)
Eosinophils Relative: 6 %
HCT: 44.3 % (ref 39.0–52.0)
Hemoglobin: 15.4 g/dL (ref 13.0–17.0)
Immature Granulocytes: 1 %
Lymphocytes Relative: 17 %
Lymphs Abs: 1.7 10*3/uL (ref 0.7–4.0)
MCH: 33.5 pg (ref 26.0–34.0)
MCHC: 34.8 g/dL (ref 30.0–36.0)
MCV: 96.3 fL (ref 80.0–100.0)
Monocytes Absolute: 0.9 10*3/uL (ref 0.1–1.0)
Monocytes Relative: 9 %
Neutro Abs: 6.5 10*3/uL (ref 1.7–7.7)
Neutrophils Relative %: 66 %
Platelets: 373 10*3/uL (ref 150–400)
RBC: 4.6 MIL/uL (ref 4.22–5.81)
RDW: 11.6 % (ref 11.5–15.5)
WBC: 9.8 10*3/uL (ref 4.0–10.5)
nRBC: 0 % (ref 0.0–0.2)

## 2023-08-11 LAB — GLUCOSE, CAPILLARY
Glucose-Capillary: 167 mg/dL — ABNORMAL HIGH (ref 70–99)
Glucose-Capillary: 172 mg/dL — ABNORMAL HIGH (ref 70–99)
Glucose-Capillary: 177 mg/dL — ABNORMAL HIGH (ref 70–99)
Glucose-Capillary: 222 mg/dL — ABNORMAL HIGH (ref 70–99)
Glucose-Capillary: 235 mg/dL — ABNORMAL HIGH (ref 70–99)

## 2023-08-11 LAB — VANCOMYCIN, TROUGH: Vancomycin Tr: 17 ug/mL (ref 15–20)

## 2023-08-11 LAB — CREATININE, SERUM
Creatinine, Ser: 0.76 mg/dL (ref 0.61–1.24)
GFR, Estimated: >60 mL/min (ref >60)

## 2023-08-11 MED ORDER — VANCOMYCIN HCL 1250 MG/250ML IV SOLN: 1250.0000 mg | Freq: Two times a day (BID) | INTRAVENOUS | Status: DC

## 2023-08-11 MED ORDER — HYDROCORTISONE 1 % EX CREA: TOPICAL_CREAM | Freq: Two times a day (BID) | CUTANEOUS | Status: DC

## 2023-08-11 MED ORDER — DIPHENHYDRAMINE HCL 25 MG PO CAPS: 25.0000 mg | ORAL_CAPSULE | Freq: Four times a day (QID) | ORAL | Status: DC | PRN

## 2023-08-11 MED ORDER — HYDRALAZINE HCL 25 MG PO TABS
25.0000 mg | ORAL_TABLET | Freq: Two times a day (BID) | ORAL | Status: DC
  Administered 2023-08-11 – 2023-08-16 (×10): 25 mg via ORAL
  Filled 2023-08-11 (×10): qty 1

## 2023-08-11 MED ORDER — LISINOPRIL 20 MG PO TABS
20.0000 mg | ORAL_TABLET | Freq: Every day | ORAL | Status: DC
  Administered 2023-08-12 – 2023-08-13 (×2): 20 mg via ORAL
  Filled 2023-08-11 (×2): qty 1

## 2023-08-11 MED ORDER — SODIUM CHLORIDE 0.9 % IV SOLN: 2.0000 g | Freq: Two times a day (BID) | INTRAVENOUS | Status: DC

## 2023-08-11 MED ORDER — ATORVASTATIN CALCIUM 10 MG PO TABS
20.0000 mg | ORAL_TABLET | Freq: Every day | ORAL | Status: DC
  Administered 2023-08-12 – 2023-08-24 (×13): 20 mg via ORAL
  Filled 2023-08-11 (×13): qty 2

## 2023-08-11 MED ORDER — ONDANSETRON HCL 4 MG PO TABS: 4.0000 mg | ORAL_TABLET | Freq: Four times a day (QID) | ORAL | Status: DC | PRN

## 2023-08-11 MED ORDER — ADULT MULTIVITAMIN W/MINERALS CH
1.0000 | ORAL_TABLET | Freq: Every day | ORAL | Status: DC
  Administered 2023-08-12 – 2023-08-13 (×2): 1 via ORAL
  Filled 2023-08-11 (×2): qty 1

## 2023-08-11 MED ORDER — CEFTRIAXONE IV (FOR PTA / DISCHARGE USE ONLY): 2.0000 g | Freq: Two times a day (BID) | INTRAVENOUS | 0 refills | Status: DC

## 2023-08-11 MED ORDER — SODIUM CHLORIDE 0.9 % IV SOLN
2.0000 g | Freq: Two times a day (BID) | INTRAVENOUS | Status: DC
  Administered 2023-08-11 – 2023-08-15 (×9): 2 g via INTRAVENOUS
  Filled 2023-08-11 (×9): qty 20

## 2023-08-11 MED ORDER — VANCOMYCIN IV (FOR PTA / DISCHARGE USE ONLY): 1250.0000 mg | Freq: Two times a day (BID) | INTRAVENOUS | 0 refills | Status: DC

## 2023-08-11 MED ORDER — DICLOFENAC SODIUM 1 % EX GEL: 2.0000 g | Freq: Three times a day (TID) | CUTANEOUS | Status: DC | PRN

## 2023-08-11 MED ORDER — HYDROCORTISONE 1 % EX CREA
TOPICAL_CREAM | Freq: Two times a day (BID) | CUTANEOUS | Status: DC
Start: 2023-08-11 — End: 2023-08-11
  Filled 2023-08-11: qty 28

## 2023-08-11 MED ORDER — ENSURE ENLIVE PO LIQD
237.0000 mL | Freq: Two times a day (BID) | ORAL | Status: DC
  Administered 2023-08-11 – 2023-08-14 (×6): 237 mL via ORAL

## 2023-08-11 MED ORDER — ENOXAPARIN SODIUM 40 MG/0.4ML IJ SOSY
40.0000 mg | PREFILLED_SYRINGE | INTRAMUSCULAR | Status: DC
  Administered 2023-08-11 – 2023-08-23 (×13): 40 mg via SUBCUTANEOUS
  Filled 2023-08-11 (×13): qty 0.4

## 2023-08-11 MED ORDER — DIPHENHYDRAMINE-ZINC ACETATE 2-0.1 % EX CREA
TOPICAL_CREAM | Freq: Two times a day (BID) | CUTANEOUS | Status: DC
Start: 2023-08-11 — End: 2023-08-14
  Filled 2023-08-11 (×2): qty 28

## 2023-08-11 MED ORDER — GUAIFENESIN-DM 100-10 MG/5ML PO SYRP
10.0000 mL | ORAL_SOLUTION | Freq: Four times a day (QID) | ORAL | Status: DC | PRN
  Administered 2023-08-11: 10 mL via ORAL
  Filled 2023-08-11: qty 10

## 2023-08-11 MED ORDER — MELATONIN 5 MG PO TABS
5.0000 mg | ORAL_TABLET | Freq: Every day | ORAL | Status: DC
Start: 2023-08-11 — End: 2023-08-24
  Administered 2023-08-11 – 2023-08-23 (×13): 5 mg via ORAL
  Filled 2023-08-11 (×13): qty 1

## 2023-08-11 MED ORDER — VANCOMYCIN HCL 1250 MG/250ML IV SOLN
1250.0000 mg | Freq: Two times a day (BID) | INTRAVENOUS | Status: DC
  Administered 2023-08-11 – 2023-08-12 (×3): 1250 mg via INTRAVENOUS
  Filled 2023-08-11 (×4): qty 250

## 2023-08-11 MED ORDER — CHLORHEXIDINE GLUCONATE CLOTH 2 % EX PADS: 6.0000 | MEDICATED_PAD | Freq: Every day | CUTANEOUS | Status: DC

## 2023-08-11 MED ORDER — CLOPIDOGREL BISULFATE 75 MG PO TABS
75.0000 mg | ORAL_TABLET | Freq: Every day | ORAL | Status: DC
  Administered 2023-08-12 – 2023-08-24 (×13): 75 mg via ORAL
  Filled 2023-08-11 (×13): qty 1

## 2023-08-11 MED ORDER — POLYETHYLENE GLYCOL 3350 17 G PO PACK
17.0000 g | PACK | Freq: Every day | ORAL | Status: DC | PRN
Start: 2023-08-11 — End: 2023-08-24

## 2023-08-11 MED ORDER — METHOCARBAMOL 500 MG PO TABS
500.0000 mg | ORAL_TABLET | Freq: Four times a day (QID) | ORAL | Status: DC | PRN
  Administered 2023-08-11 – 2023-08-14 (×4): 500 mg via ORAL
  Filled 2023-08-11 (×4): qty 1

## 2023-08-11 MED ORDER — BISACODYL 5 MG PO TBEC: 5.0000 mg | DELAYED_RELEASE_TABLET | Freq: Every day | ORAL | Status: DC | PRN

## 2023-08-11 MED ORDER — ASPIRIN 81 MG PO CHEW
81.0000 mg | CHEWABLE_TABLET | Freq: Every day | ORAL | Status: DC
  Administered 2023-08-12 – 2023-08-24 (×13): 81 mg via ORAL
  Filled 2023-08-11 (×13): qty 1

## 2023-08-11 MED ORDER — TIMOLOL MALEATE 0.5 % OP SOLN
1.0000 [drp] | Freq: Every day | OPHTHALMIC | Status: DC
  Administered 2023-08-12 – 2023-08-24 (×13): 1 [drp] via OPHTHALMIC
  Filled 2023-08-11: qty 5

## 2023-08-11 MED ORDER — ACETAMINOPHEN 325 MG PO TABS
325.0000 mg | ORAL_TABLET | ORAL | Status: DC | PRN
  Administered 2023-08-21: 650 mg via ORAL
  Filled 2023-08-11: qty 2

## 2023-08-11 MED ORDER — INSULIN ASPART 100 UNIT/ML IJ SOLN
0.0000 [IU] | Freq: Three times a day (TID) | INTRAMUSCULAR | Status: DC
  Administered 2023-08-11 – 2023-08-12 (×2): 3 [IU] via SUBCUTANEOUS
  Administered 2023-08-12: 2 [IU] via SUBCUTANEOUS
  Administered 2023-08-12: 5 [IU] via SUBCUTANEOUS
  Administered 2023-08-13: 3 [IU] via SUBCUTANEOUS
  Administered 2023-08-13: 1 [IU] via SUBCUTANEOUS
  Administered 2023-08-13 – 2023-08-14 (×2): 3 [IU] via SUBCUTANEOUS
  Administered 2023-08-14: 2 [IU] via SUBCUTANEOUS
  Administered 2023-08-14: 1 [IU] via SUBCUTANEOUS
  Administered 2023-08-15 (×2): 2 [IU] via SUBCUTANEOUS
  Administered 2023-08-16: 1 [IU] via SUBCUTANEOUS

## 2023-08-11 MED ORDER — ONDANSETRON HCL 4 MG/2ML IJ SOLN: 4.0000 mg | Freq: Four times a day (QID) | INTRAMUSCULAR | Status: DC | PRN

## 2023-08-11 MED ORDER — ALUM & MAG HYDROXIDE-SIMETH 200-200-20 MG/5ML PO SUSP: 30.0000 mL | ORAL | Status: DC | PRN

## 2023-08-11 MED ORDER — CLOPIDOGREL BISULFATE 75 MG PO TABS: 75.0000 mg | ORAL_TABLET | Freq: Every day | ORAL | Status: AC

## 2023-08-11 MED ORDER — FLEET ENEMA RE ENEM
1.0000 | ENEMA | Freq: Once | RECTAL | Status: DC | PRN
Start: 2023-08-11 — End: 2023-08-24

## 2023-08-11 MED ORDER — ENOXAPARIN SODIUM 40 MG/0.4ML IJ SOSY: 40.0000 mg | PREFILLED_SYRINGE | INTRAMUSCULAR | Status: DC

## 2023-08-11 MED ORDER — INSULIN GLARGINE-YFGN 100 UNIT/ML ~~LOC~~ SOLN
5.0000 [IU] | Freq: Every day | SUBCUTANEOUS | Status: DC
  Administered 2023-08-11 – 2023-08-23 (×13): 5 [IU] via SUBCUTANEOUS
  Filled 2023-08-11 (×14): qty 0.05

## 2023-08-11 NOTE — Plan of Care (Signed)
  Problem: Coping: Goal: Ability to adjust to condition or change in health will improve Outcome: Progressing   Problem: Fluid Volume: Goal: Ability to maintain a balanced intake and output will improve Outcome: Progressing   Problem: Health Behavior/Discharge Planning: Goal: Ability to identify and utilize available resources and services will improve Outcome: Progressing Goal: Ability to manage health-related needs will improve Outcome: Progressing   
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
## 2023-08-11 NOTE — Progress Notes (Signed)
Inpatient Rehabilitation Admission Medication Review by a Pharmacist  A complete drug regimen review was completed for this patient to identify any potential clinically significant medication issues.  High Risk Drug Classes Is patient taking? Indication by Medication  Antipsychotic No   Anticoagulant Yes Enoxaparin - VTE ppx  Antibiotic Yes, as an intravenous medication Ceftriaxone, Vancomycin - culture negative endocarditis, EOT 09/12/23  Opioid No   Antiplatelet Yes bASA, clopidogrel - acute stroke  Hypoglycemics/insulin Yes Novolog SSI, Semglee - T2DM  Vasoactive Medication Yes Hydralazine, lisinopril - HTN  Chemotherapy No   Other Yes Atorvastatin - HLD Diclofenac gel - prn pain Robaxin - prn muscle spasms Melatonin - sleep Maalox - prn indigestion Robitussin DM - prn cough Zofran - prn nausea/vomiting     Type of Medication Issue Identified Description of Issue Recommendation(s)  Drug Interaction(s) (clinically significant)     Duplicate Therapy     Allergy     No Medication Administration End Date     Incorrect Dose     Additional Drug Therapy Needed     Significant med changes from prior encounter (inform family/care partners about these prior to discharge).    Other  PTA meds not yet resumed: gabapentin, glimepiride, empagliflozin, fish oil, colchicine PRN Restart PTA meds when and if necessary during CIR admission or at time of discharge, if warranted    Clinically significant medication issues were identified that warrant physician communication and completion of prescribed/recommended actions by midnight of the next day:  No  Name of provider notified for urgent issues identified:   Provider Method of Notification:   Pharmacist comments:   Time spent performing this drug regimen review (minutes):  20  Rexford Maus, PharmD, BCPS 08/11/2023 1:11 PM

## 2023-08-11 NOTE — Progress Notes (Signed)
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  Labs - Thursday:  BMP and vancomycin trough  Fax weekly lab results  promptly to [phone]  Please pull PIC at completion of IV antibiotics   IV antibiotic discharge orders are pended. To discharging provider:  please sign these orders via discharge navigator,  Select New Orders & click on the button choice - Manage This Unsigned Work.     Thank you for allowing pharmacy to be a part of this patient's care.  Juliette Alcide, PharmD, BCPS, BCIDP Work Cell: [phone] 08/11/2023 8:57 AM

## 2023-08-11 NOTE — Discharge Summary (Addendum)
Physician Discharge Summary   Patient: Jeremy Valencia MRN: [id number] DOB: 04/09/1941  Admit date:     08/01/2023  Discharge date: 08/11/23  Discharge Physician: Lurene Shadow   PCP: Lynnea Ferrier, MD   Recommendations at discharge:   Follow-up with Dr. 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 *  Hospital Course:  Jeremy Valencia is a 82 y.o. male with medical history significant for COPD (emphysema) type 2 diabetes mellitus, hypertension,, previous hospitalization for sepsis of unknown etiology in 2019 and 2023, history of UTIs, who presented to the hospital with confusion, lethargy chills and nausea.     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  Q fever  and Bartonella antibodies negative.  Fungitell and Karius tests were pending at the time of discharge.  Follow-up with ID. Blood cultures were negative.  TEE on 09/04/2023 which showed a large mobile vegetation on posterior leaflet of mitral valve PICC line placed in the left arm on 08/10/2023     Acute stroke: CT head showed bilateral parietal cortical infarcts, right frontal lobe infarct. Neurologist recommended that aspirin and Plavix be continued indefinitely.  Continue Lipitor.   2D echo showed EF estimated 55 to 60%, grade 1 diastolic dysfunction. PT and OT recommended acute rehab.     Acute metabolic encephalopathy:  Improved     COPD: Stable     Type II DM with hyperglycemia: Continue glimepiride and Jardiance at discharge. Hemoglobin A1c 7.3. Lipid panel on 02/02/2023 showed LDL 51, HDL 35.8, total cholesterol 138 and triglycerides 256     Hypertension: Continue antihypertensives     Mild hyponatremia: Improved   Rash on back and buttocks: Continue hydrocortisone cream   His condition is improved and he is deemed stable for discharge to: Inpatient rehab today.  Discharge plan was discussed with Genelle Bal, son, at the bedside.      Consultants: ID specialist, cardiologist, neurologist Procedures performed: TEE Disposition: Rehabilitation facility Diet recommendation:  Discharge Diet Orders (From admission, onward)     Start     Ordered   08/11/23 0000  Diet - low sodium heart healthy        08/11/23 0835           Cardiac and Carb modified diet DISCHARGE MEDICATION: Allergies as of 08/11/2023       Reactions   Metformin Diarrhea   Rofecoxib Other (See Comments)   Other reaction(s): Unknown   Lamisil [terbinafine] Rash        Medication List     STOP taking these medications    Zinc Acetate 50 MG Caps       TAKE these medications    albuterol 108 (90 Base) MCG/ACT inhaler Commonly known as: VENTOLIN HFA Inhale 2 puffs into the lungs every 6 (six) hours as needed for wheezing or shortness of breath.   aspirin 81 MG tablet Take 81 mg by mouth daily.   atorvastatin 20 MG tablet Commonly known as:  LIPITOR Take 20 mg by mouth daily.   cefTRIAXone IVPB Commonly known as: ROCEPHIN Inject 2 g into the vein every 12 (twelve) hours. 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 Labs - Thursday:  BMP and vancomycin trough Fax weekly lab results  promptly to [phone] Please pull PIC at completion of IV antibiotics Method of  administration: IV Push Method of administration may be changed at the discretion of home infusion pharmacist based upon assessment of the patient and/or caregiver's ability to self-administer the medication ordered.   clopidogrel 75 MG tablet Commonly known as: PLAVIX Take 1 tablet (75 mg total) by mouth daily.   co-enzyme Q-10 50 MG capsule Take 50 mg by mouth as directed.   colchicine 0.6 MG tablet Take 0.6 mg by mouth as needed.   Fish Oil 1000 MG Caps Take 3-4 capsules by mouth daily.   gabapentin 300 MG capsule Commonly known as: NEURONTIN TAKE 1 CAPSULE BY MOUTH THREE TIMES DAILY   glimepiride 4 MG tablet Commonly known as: AMARYL Take 4 mg by mouth every morning.   hydrALAZINE 25 MG tablet Commonly known as: APRESOLINE Take 25 mg by mouth 2 (two) times daily.   hydrocortisone cream 1 % Apply topically 2 (two) times daily for 5 days. Apply on rash on back   Jardiance 10 MG Tabs tablet Generic drug: empagliflozin Take 10 mg by mouth daily.   lisinopril 20 MG tablet Commonly known as: ZESTRIL Take 20 mg by mouth daily.   Multi-Vitamins Tabs Take 1 tablet by mouth daily.   timolol 0.5 % ophthalmic solution Commonly known as: TIMOPTIC Place 1 drop into both eyes daily.   vancomycin IVPB Inject 1,250 mg into the vein every 12 (twelve) hours. 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 Labs - Thursday:  BMP and vancomycin trough Fax weekly lab results  promptly to [phone] Please pull PIC at completion of IV antibiotics Method of administration:Elastomeric Method of administration may be changed at the discretion of the patient and/or caregiver's ability to self-administer the medication ordered.   vitamin C 1000 MG tablet Take 1,000 mg by mouth daily.   Vitamin D3 25 MCG (1000 UT) Caps Take 1,000 Units by mouth daily.                Discharge Care Instructions  (From admission, onward)           Start     Ordered   08/11/23 0000  Change dressing on IV access line weekly and PRN  (Home infusion instructions - [facility] )        08/11/23 0905            Follow-up Information     Lynn Ito, MD Follow up on 08/31/2023.   Specialty: Infectious Diseases Why: Go at 10:45am. Contact information: [address] [place] [address] [phone]                Discharge Exam: Ceasar Mons Weights   08/02/23 0336  Weight: 94.2 kg   GEN: NAD SKIN: Rash on back and buttocks EYES: No pallor or icterus ENT: MMM CV: RRR PULM: CTA B ABD: soft, ND, NT, +BS CNS: AAO x 3, right-sided hemiparesis (power RUE 4/5, RLE 4+5)  EXT: No edema or tenderness   Condition at discharge: good  The results of significant diagnostics from this hospitalization (including imaging, microbiology, ancillary and laboratory) are listed below for reference.   Imaging Studies: [place] EKG SITE RITE  Result Date: 08/10/2023 If Site Rite image not attached, placement could not be confirmed due to current cardiac rhythm.  [place] EKG SITE RITE  Result Date: 08/09/2023 If Site Rite image not attached, placement could not be confirmed due to current cardiac rhythm.  ECHO TEE  Result Date: 08/09/2023    TRANSESOPHOGEAL ECHO REPORT   Patient Name:   CAELUM NYGARD Date of Exam: 08/08/2023 Medical Rec #:  [id number]      Height:       73.0 in Accession #:    [id number]     Weight:       207.7 lb Date of Birth:  Dec 20, 1940      BSA:          2.186 m Patient Age:    82 years       BP:           134/80 mmHg Patient Gender: M              HR:           86 bpm. Exam Location:  [facility] Procedure: Transesophageal Echo, Color Doppler, Cardiac Doppler and Saline            Contrast Bubble Study Indications:     sepsis, endocarditis, stroke  History:         Patient has prior history of Echocardiogram examinations,  most                  recent 08/06/2023. Risk Factors:Diabetes, Hypertension and                  Dyslipidemia.  Sonographer:     Cristela Blue Referring Phys:  [id number] CHRISTOPHER RONALD BERGE Diagnosing Phys: Julien Nordmann MD PROCEDURE: After discussion of the risks and benefits of a TEE, an informed consent was obtained from a family member. TEE procedure time was 30 minutes. The transesophogeal probe was passed without difficulty through the esophogus of the patient. Local oropharyngeal anesthetic was provided with Cetacaine and viscous lidocaine. Sedation performed by different physician. Image quality was excellent. The patient's vital signs; including heart rate, blood pressure, and oxygen saturation; remained stable throughout the procedure. The patient developed no complications during the procedure.  IMPRESSIONS  1. Left ventricular ejection fraction, by estimation, is 55 to 60%. The left ventricle has normal function. The left ventricle has no regional wall motion abnormalities.  2. Right ventricular systolic function is normal. The right ventricular size is normal.  3. Grossly normal mitral valve function. Large mobile opacity attached to the posterior leaflet, supraventricular, concerning for vegetation/endocarditis. Mild mitral valve regurgitation. No evidence of mitral stenosis.  4. The aortic valve is normal in structure. There is moderate calcification of the aortic valve. Aortic valve regurgitation is not visualized. Aortic valve sclerosis/calcification is present, without any evidence of aortic stenosis.  5. The inferior vena cava is normal in size with greater than 50% respiratory variability, suggesting right atrial pressure of 3 mmHg.  6. Left atrial size was moderately dilated. No left atrial/left atrial appendage thrombus was detected.  7. Agitated saline contrast bubble study was negative, with no evidence of any interatrial shunt. Conclusion(s)/Recommendation(s): Normal biventricular function,  Large mitral valve vegetation noted consistent with endocarditis FINDINGS  Left Ventricle:  Left ventricular ejection fraction, by estimation, is 55 to 60%. The left ventricle has normal function. The left ventricle has no regional wall motion abnormalities. The left ventricular internal cavity size was normal in size. There is  no left ventricular hypertrophy. Right Ventricle: The right ventricular size is normal. No increase in right ventricular wall thickness. Right ventricular systolic function is normal. Left Atrium: Left atrial size was moderately dilated. No left atrial/left atrial appendage thrombus was detected. Right Atrium: Right atrial size was normal in size. Pericardium: There is no evidence of pericardial effusion. Mitral Valve: The mitral valve is normal in structure. Mild mitral valve regurgitation. No evidence of mitral valve stenosis. Tricuspid Valve: The tricuspid valve is normal in structure. Tricuspid valve regurgitation is mild . No evidence of tricuspid stenosis. Aortic Valve: The aortic valve is normal in structure. There is moderate calcification of the aortic valve. Aortic valve regurgitation is not visualized. Aortic valve sclerosis/calcification is present, without any evidence of aortic stenosis. Pulmonic Valve: The pulmonic valve was normal in structure. Pulmonic valve regurgitation is not visualized. No evidence of pulmonic stenosis. Aorta: The aortic root is normal in size and structure. Venous: The inferior vena cava is normal in size with greater than 50% respiratory variability, suggesting right atrial pressure of 3 mmHg. IAS/Shunts: No atrial level shunt detected by color flow Doppler. Agitated saline contrast was given intravenously to evaluate for intracardiac shunting. Agitated saline contrast bubble study was negative, with no evidence of any interatrial shunt. There  is no evidence of a patent foramen ovale. There is no evidence of an atrial septal defect. Julien Nordmann MD  Electronically signed by Julien Nordmann MD Signature Date/Time: 08/09/2023/1:21:32 PM    Final    ECHOCARDIOGRAM COMPLETE  Result Date: 08/06/2023    ECHOCARDIOGRAM REPORT   Patient Name:   RONI STARR Date of Exam: 08/05/2023 Medical Rec #:  [id number]      Height:       73.0 in Accession #:    [id number]     Weight:       207.7 lb Date of Birth:  1941/07/27      BSA:          2.186 m Patient Age:    82 years       BP:           162/84 mmHg Patient Gender: M              HR:           86 bpm. Exam Location:  [facility] Procedure: 2D Echo, Cardiac Doppler and Color Doppler Indications:     Stroke I63.9  History:         Patient has no prior history of Echocardiogram examinations.                  COPD; Risk Factors:Hypertension, Diabetes and Dyslipidemia.  Sonographer:     Lucendia Herrlich RCS Referring Phys:  [id number] Charise Killian Diagnosing Phys: Chilton Si MD IMPRESSIONS  1. Left ventricular ejection fraction, by estimation, is 55 to 60%. The left ventricle has normal function. The left ventricle has no regional wall motion abnormalities. Left ventricular diastolic parameters are consistent with Grade I diastolic dysfunction (impaired relaxation).  2. Right ventricular systolic function is normal. The right ventricular size is normal. There is normal pulmonary artery systolic pressure.  3. There is a mobile density of the atrial side of the P2 portion of the posterior valve leaflet. Measures 1.16  x 0.73 cm. Concerning for endocarditis. Recommend TEE to further evaluate. The mitral valve is normal in structure. Mild mitral valve regurgitation. No evidence of mitral stenosis. Moderate mitral annular calcification.  4. The aortic valve is tricuspid. There is moderate calcification of the aortic valve. There is moderate thickening of the aortic valve. Aortic valve regurgitation is not visualized. No aortic stenosis is present. Aortic valve area, by VTI measures 2.28  cm. Aortic valve mean gradient measures  6.5 mmHg. Aortic valve Vmax measures 1.63 m/s.  5. The inferior vena cava is normal in size with greater than 50% respiratory variability, suggesting right atrial pressure of 3 mmHg. FINDINGS  Left Ventricle: Left ventricular ejection fraction, by estimation, is 55 to 60%. The left ventricle has normal function. The left ventricle has no regional wall motion abnormalities. The left ventricular internal cavity size was normal in size. There is  no left ventricular hypertrophy. Left ventricular diastolic parameters are consistent with Grade I diastolic dysfunction (impaired relaxation). Indeterminate filling pressures. Right Ventricle: The right ventricular size is normal. No increase in right ventricular wall thickness. Right ventricular systolic function is normal. There is normal pulmonary artery systolic pressure. The tricuspid regurgitant velocity is 1.38 m/s, and  with an assumed right atrial pressure of 3 mmHg, the estimated right ventricular systolic pressure is 10.6 mmHg. Left Atrium: Left atrial size was normal in size. Right Atrium: Right atrial size was normal in size. Pericardium: There is no evidence of pericardial effusion. Mitral Valve: There is a mobile density of the atrial side of the P2 portion of the posterior valve leaflet. Measures 1.16 x 0.73 cm. Concerning for endocarditis. Recommend TEE to further evaluate. The mitral valve is normal in structure. Moderate mitral  annular calcification. Mild mitral valve regurgitation. No evidence of mitral valve stenosis. Tricuspid Valve: The tricuspid valve is normal in structure. Tricuspid valve regurgitation is trivial. No evidence of tricuspid stenosis. Aortic Valve: The aortic valve is tricuspid. There is moderate calcification of the aortic valve. There is moderate thickening of the aortic valve. Aortic valve regurgitation is not visualized. No aortic stenosis is present. Aortic valve mean gradient measures 6.5 mmHg. Aortic valve peak gradient measures  10.7 mmHg. Aortic valve area, by VTI measures 2.28 cm. Pulmonic Valve: The pulmonic valve was normal in structure. Pulmonic valve regurgitation is not visualized. No evidence of pulmonic stenosis. Aorta: The aortic root is normal in size and structure. Venous: The inferior vena cava is normal in size with greater than 50% respiratory variability, suggesting right atrial pressure of 3 mmHg. IAS/Shunts: No atrial level shunt detected by color flow Doppler.  LEFT VENTRICLE PLAX 2D LVIDd:         3.70 cm   Diastology LVIDs:         2.70 cm   LV e' medial:    7.62 cm/s LV PW:         1.00 cm   LV E/e' medial:  11.2 LV IVS:        1.00 cm   LV e' lateral:   10.00 cm/s LVOT diam:     2.30 cm   LV E/e' lateral: 8.6 LV SV:         71 LV SV Index:   33 LVOT Area:     4.15 cm  RIGHT VENTRICLE             IVC RV S prime:     18.60 cm/s  IVC diam: 1.60 cm TAPSE (M-mode): 2.4 cm LEFT  ATRIUM           Index LA diam:      3.00 cm 1.37 cm/m LA Vol (A2C): 35.7 ml 16.33 ml/m LA Vol (A4C): 26.8 ml 12.26 ml/m  AORTIC VALVE AV Area (Vmax):    2.46 cm AV Area (Vmean):   2.23 cm AV Area (VTI):     2.28 cm AV Vmax:           163.33 cm/s AV Vmean:          116.000 cm/s AV VTI:            0.314 m AV Peak Grad:      10.7 mmHg AV Mean Grad:      6.5 mmHg LVOT Vmax:         96.80 cm/s LVOT Vmean:        62.200 cm/s LVOT VTI:          0.172 m LVOT/AV VTI ratio: 0.55  AORTA Ao Root diam: 3.30 cm Ao Asc diam:  3.70 cm MITRAL VALVE                TRICUSPID VALVE MV Area (PHT): 2.99 cm     TR Peak grad:   7.6 mmHg MV Decel Time: 254 msec     TR Vmax:        138.00 cm/s MV E velocity: 85.60 cm/s MV A velocity: 149.00 cm/s  SHUNTS MV E/A ratio:  0.57         Systemic VTI:  0.17 m                             Systemic Diam: 2.30 cm Chilton Si MD Electronically signed by Chilton Si MD Signature Date/Time: 08/06/2023/3:45:51 PM    Final    US Venous Img Upper Uni Right(DVT)  Result Date: 08/05/2023 CLINICAL DATA:  Right upper  extremity edema for the past 3 days. Evaluate for DVT. EXAM: RIGHT UPPER EXTREMITY VENOUS DOPPLER ULTRASOUND TECHNIQUE: Gray-scale sonography with graded compression, as well as color Doppler and duplex ultrasound were performed to evaluate the upper extremity deep venous system from the level of the subclavian vein and including the jugular, axillary, basilic, radial, ulnar and upper cephalic vein. Spectral Doppler was utilized to evaluate flow at rest and with distal augmentation maneuvers. COMPARISON:  None Available. FINDINGS: Contralateral Subclavian Vein: Respiratory phasicity is normal and symmetric with the symptomatic side. No evidence of thrombus. Normal compressibility. Internal Jugular Vein: No evidence of thrombus. Normal compressibility, respiratory phasicity and response to augmentation. Subclavian Vein: No evidence of thrombus. Normal compressibility, respiratory phasicity and response to augmentation. Axillary Vein: No evidence of thrombus. Normal compressibility, respiratory phasicity and response to augmentation. Cephalic Vein: No evidence of thrombus. Normal compressibility, respiratory phasicity and response to augmentation. Basilic Vein: No evidence of thrombus. Normal compressibility, respiratory phasicity and response to augmentation. Brachial Veins: No evidence of thrombus. Normal compressibility, respiratory phasicity and response to augmentation. Radial Veins: No evidence of thrombus. Normal compressibility, respiratory phasicity and response to augmentation. Ulnar Veins: No evidence of thrombus. Normal compressibility, respiratory phasicity and response to augmentation. Other Findings: There is a moderate amount of subcutaneous edema at the level of the right upper arm. IMPRESSION: No evidence of DVT within the right upper extremity. Electronically Signed   By: Simonne Come M.D.   On: 08/05/2023 14:56   CT HEAD WO CONTRAST ( )  Result Date: 08/05/2023 CLINICAL DATA:  Neuro  deficit,  acute, stroke suspected. EXAM: CT HEAD WITHOUT CONTRAST TECHNIQUE: Contiguous axial images were obtained from the base of the skull through the vertex without intravenous contrast. RADIATION DOSE REDUCTION: This exam was performed according to the departmental dose-optimization program which includes automated exposure control, adjustment of the mA and/or kV according to patient size and/or use of iterative reconstruction technique. COMPARISON:  CT head without contrast 08/03/2023 and 08/02/2023. CT angio head and neck 08/04/2023. 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 No new infarcts are present. Moderate atrophy and white matter changes are stable. The ventricles are proportionate to the degree of atrophy. No significant extraaxial fluid collection is present. The brainstem and cerebellum are within normal limits. Midline structures are within normal limits. Vascular: Atherosclerotic calcifications are present within the cavernous internal carotid arteries bilaterally and at the dural margin of both vertebral arteries. No hyperdense vessel is present. Skull: Calvarium is intact. No focal lytic or blastic lesions are present. No significant extracranial soft tissue lesion is present. Sinuses/Orbits: Right globe prosthesis is present. The paranasal sinuses and mastoid air cells are clear. IMPRESSION: 1. Stable bilateral parietal cortical infarcts. 2. No new infarcts or acute intracranial abnormality. 3. Stable atrophy and white matter disease. This likely reflects the sequela of chronic microvascular ischemia. Electronically Signed   By: Marin Roberts M.D.   On: 08/05/2023 09:18   DG Wrist 2 Views Right  Result Date: 08/04/2023 CLINICAL DATA:  Right wrist pain.  Atrial fracture. EXAM: RIGHT WRIST - 2 VIEW COMPARISON:  None Available. FINDINGS: There is diffuse decreased bone mineralization. 2 mm ulnar positive variance. Mild-to-moderate calcification overlying the  triangular fibrocartilage complex. Mild triscaphe joint space narrowing and lateral osteophytosis. Severe thumb carpometacarpal joint space narrowing, subchondral sclerosis/cystic change, and peripheral osteophytosis. Mild thumb metacarpophalangeal osteoarthritis. No acute fracture is seen. No dislocation. Moderate atherosclerotic calcifications. IMPRESSION: 1. Severe thumb carpometacarpal osteoarthritis. 2. Mild triscaphe osteoarthritis. 3. Mild-to-moderate calcification overlying the triangular fibrocartilage complex, which may represent chondrocalcinosis. Electronically Signed   By: Neita Garnet M.D.   On: 08/04/2023 15:50   CT ANGIO HEAD NECK W WO CM  Result Date: 08/04/2023 CLINICAL DATA:  Stroke, follow-up.  Unable to move right arm. EXAM: CT ANGIOGRAPHY HEAD AND NECK WITH AND WITHOUT CONTRAST TECHNIQUE: Multidetector CT imaging of the head and neck was performed using the standard protocol during bolus administration of intravenous contrast. Multiplanar CT image reconstructions and MIPs were obtained to evaluate the vascular anatomy. Carotid stenosis measurements (when applicable) are obtained utilizing NASCET criteria, using the distal internal carotid diameter as the denominator. RADIATION DOSE REDUCTION: This exam was performed according to the departmental dose-optimization program which includes automated exposure control, adjustment of the mA and/or kV according to patient size and/or use of iterative reconstruction technique. CONTRAST:  75mL OMNIPAQUE IOHEXOL 350 MG/ML SOLN COMPARISON:  CT head without contrast 08/03/2023 and 08/02/2023. FINDINGS: CT HEAD FINDINGS Brain: The right parietal acute/subacute infarct is stable. Other smaller punctate cortical infarcts involving the posterior left frontal lobe and parietal lobe are also stable. No new infarcts are present compared to the most recent exam. Moderate atrophy and white matter disease is stable. The ventricles are proportionate to the  degree of atrophy. No significant extraaxial fluid collection is present. The brainstem and cerebellum are within normal limits. Vascular: Atherosclerotic calcifications are again noted within the cavernous internal carotid arteries bilaterally. Skull: Calvarium is intact. No focal lytic or blastic lesions are present. No significant extracranial soft tissue lesion is present. Sinuses/Orbits:  The paranasal sinuses and mastoid air cells are clear. 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 Two metallic foci are present posteriorly in the right orbit. Review of the MIP images confirms the above findings CTA NECK FINDINGS Aortic arch: The aortic arch is incompletely imaged. Atherosclerotic changes are present at the origin of the left subclavian artery without significant stenosis. Right carotid system: Scattered mural calcifications are present in the right common carotid artery. Calcifications are present at the right carotid bifurcation proximal right ICA without significant stenosis relative to the more distal vessel. The cervical right ICA is normal otherwise. Left carotid system: The left common carotid artery is within normal limits. Atherosclerotic calcifications are present at the left carotid bifurcation and proximal left ICA without significant stenosis relative to the more distal vessel. Mild tortuosity is present in the distal cervical left ICA without significant stenosis. Vertebral arteries: The vertebral arteries are codominant. Both vertebral arteries originate from the subclavian arteries without significant stenosis. No significant stenosis is present in either vertebral artery in the neck. Skeleton: Multilevel degenerative changes are present in the facet joints bilaterally, left greater than right. No focal osseous lesions are present. Other neck: Soft tissues the neck are otherwise unremarkable. Salivary glands are within normal limits. Thyroid is normal. No significant adenopathy is present. No  focal mucosal or submucosal lesions are present. Upper chest: Paraseptal and centrilobular emphysematous changes are present. No nodule or mass lesion is present. The thoracic inlet is within normal limits. Review of the MIP images confirms the above findings CTA HEAD FINDINGS Anterior circulation: Atherosclerotic calcifications are present within the cavernous internal carotid arteries bilaterally without focal stenosis relative to the ICA termini. 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 The ICA termini are otherwise within normal limits bilaterally. The anterior communicating artery is patent. The MCA bifurcations are within normal limits bilaterally. The ACA and MCA branch vessels are normal bilaterally. Posterior circulation: Atherosclerotic calcifications are present at the dural margin of both vertebral arteries. No significant stenosis is present. The PICA origins are visualized and normal. The vertebrobasilar junction and basilar artery normal. The superior cerebellar arteries are patent. Both posterior cerebral arteries originate from basilar tip. The PCA branch vessels are within normal limits bilaterally. No aneurysm is present. Venous sinuses: The dural sinuses are patent. The straight sinus and deep cerebral veins are intact. Cortical veins are within normal limits. No significant vascular malformation is evident. Anatomic variants: None. Review of the MIP images confirms the above findings IMPRESSION: 1. Stable right parietal acute/subacute infarct. 2. Other smaller punctate cortical infarcts involving the posterior left frontal lobe and parietal lobe are also stable. 3. No new infarcts are present compared to the most recent exam. 4. Stable atrophy and white matter disease. This likely reflects the sequela of chronic microvascular ischemia. 5. 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 6. Atherosclerotic changes at the carotid bifurcations and proximal ICAs bilaterally without significant stenosis relative to the more distal vessels. 7. No other significant stenosis, aneurysm, or branch vessel occlusion within the Circle of Willis. 8. Aortic Atherosclerosis (ICD10-I70.0) and Emphysema (ICD10-J43.9). The above was relayed via text pager to Dr. Caryl Pina on 08/04/2023 at 15:41 . Electronically Signed   By: Marin Roberts M.D.   On: 08/04/2023 15:41   CT HEAD WO CONTRAST ( )  Result Date: 08/03/2023 CLINICAL DATA:  Neuro deficit, acute, stroke suspected. Disoriented. Unable to move right arm. EXAM: CT HEAD WITHOUT CONTRAST TECHNIQUE: Contiguous axial images were obtained from the base of the skull through the vertex without intravenous contrast. RADIATION DOSE REDUCTION: This exam was performed according to the departmental dose-optimization program which includes automated exposure control, adjustment of the mA and/or kV according to patient size and/or use of iterative reconstruction technique. COMPARISON:  CT head without contrast 08/02/2023 FINDINGS: Brain: Focal area of hypoattenuation involving the right parietal cortex is again noted. Punctate focus cortical hypoattenuation is present in the posterior right frontal lobe, potentially new from prior exam. A punctate focus of cortical hypoattenuation is present in the left parietal lobe on image 25 series 2. No acute hemorrhage or mass lesion is present. Generalized atrophy and white matter disease is stable. Deep brain nuclei are within normal limits. The brainstem and cerebellum are within normal limits. Midline structures are within normal limits. Vascular: Atherosclerotic calcifications are present within the cavernous internal carotid arteries bilaterally. No hyperdense vessel is present. Skull: Calvarium is intact. No focal lytic or blastic lesions are present. No  significant extracranial soft tissue lesion is present. Sinuses/Orbits: 2 punctate metallic foci are again noted in the posterior right orbit. 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 Left globe and orbit are normal. The paranasal sinuses and mastoid air cells are clear. IMPRESSION: 1. Punctate focus of cortical hypoattenuation in the posterior right frontal lobe, potentially new from prior exam. This may represent a small acute or subacute infarct. 2. Punctate focus of cortical hypoattenuation in the left parietal lobe is also potentially new from prior exam. 3. Stable focal area of hypoattenuation involving the right parietal cortex. This likely represents a subacute or chronic infarct. Recommend MRI of the brain without contrast for further evaluation of these lesions. 4. Stable generalized atrophy and white matter disease. This likely reflects the sequela of chronic microvascular ischemia. These results will be called to the ordering clinician or representative by the Radiologist Assistant, and communication documented in the PACS or [employer]. Electronically Signed   By: Marin Roberts M.D.   On: 08/03/2023 12:05   CT HEAD WO CONTRAST ( )  Result Date: 08/02/2023 CLINICAL DATA:  Mental status change, unknown cause EXAM: CT HEAD WITHOUT CONTRAST TECHNIQUE: Contiguous axial images were obtained from the base of the skull through the vertex without intravenous contrast. RADIATION DOSE REDUCTION: This exam was performed according to the departmental dose-optimization program which includes automated exposure control, adjustment of the mA and/or kV according to patient size and/or use of iterative reconstruction technique. COMPARISON:  11/15/2021 FINDINGS: Brain: Moderate parenchymal volume loss is commensurate with the patient's age. Moderate periventricular white matter changes are present, stable since prior examination, likely the sequela of small vessel ischemia. Interval development of small  focus of cortical encephalomalacia within the right parietal lobe in keeping with remote infarct or trauma though new from prior examination. No evidence of acute intracranial hemorrhage or infarct. No abnormal mass effect or midline shift. No abnormal intra or extra-axial mass lesion ventricular size is normal. Cerebellum is unremarkable. Vascular: No hyperdense vessel or unexpected calcification. Skull: Normal. Negative for fracture or focal lesion. 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 Left orbit is unremarkable. Paranasal sinuses are clear. Other: Mastoid air cells and middle ear cavities are clear. IMPRESSION: 1. No acute intracranial hemorrhage or infarct. 2. Interval development of small focus of cortical encephalomalacia within the right parietal lobe in keeping with remote infarct or trauma though new  from prior examination. 3. Stable moderate senescent change. Electronically Signed   By: Helyn Numbers M.D.   On: 08/02/2023 03:01   CT ABDOMEN PELVIS WO CONTRAST  Result Date: 08/02/2023 CLINICAL DATA:  Sepsis EXAM: CT ABDOMEN AND PELVIS WITHOUT CONTRAST TECHNIQUE: Multidetector CT imaging of the abdomen and pelvis was performed following the standard protocol without IV contrast. RADIATION DOSE REDUCTION: This exam was performed according to the departmental dose-optimization program which includes automated exposure control, adjustment of the mA and/or kV according to patient size and/or use of iterative reconstruction technique. COMPARISON:  03/02/2015 FINDINGS: Lower chest: No acute abnormality. Extensive multi-vessel coronary artery calcification. Punctate metallic shrapnel noted within the visualized anterior chest wall. Hepatobiliary: No focal liver abnormality is seen. No gallstones, gallbladder wall thickening, or biliary dilatation. Pancreas: Unremarkable Spleen: Unremarkable Adrenals/Urinary Tract: The adrenal glands are unremarkable. The kidneys are normal in size and  position. Stable moderate bilateral perinephric stranding, nonspecific. No hydronephrosis. Note is made of an extrarenal pelvis on the right. No intrarenal or ureteral calcifications. The bladder is mildly distended, but is otherwise unremarkable. Stomach/Bowel: Severe descending and sigmoid colonic diverticulosis. The stomach, small bowel, and large bowel are otherwise unremarkable. No evidence of obstruction or focal inflammation. The appendix is absent. Vascular/Lymphatic: Extensive aortoiliac atherosclerotic calcification. No aortic aneurysm. No pathologic adenopathy within the abdomen and pelvis. Reproductive: Mild prostatic hypertrophy Other: Small fat containing bilateral inguinal hernias, right greater than left. Musculoskeletal: L3-L5 anterior lumbar fusion with instrumentation with bilateral facetectomy at each level in seen degenerative changes are seen at L2-3. No acute bone abnormality. No lytic or blastic bone lesion. IMPRESSION: 1. No acute intra-abdominal pathology identified. No definite radiographic explanation for the patient's reported symptoms. 2. Extensive multi-vessel coronary artery calcification. 3. Severe distal colonic diverticulosis without superimposed acute inflammatory change. 4. Mild prostatic hypertrophy. Aortic Atherosclerosis (ICD10-I70.0). Electronically Signed   By: Helyn Numbers M.D.   On: 08/02/2023 02:57   DG Chest Port 1 View  Result Date: 08/01/2023 CLINICAL DATA:  Questionable sepsis EXAM: PORTABLE CHEST 1 VIEW COMPARISON:  Chest x-ray 11/15/2021 FINDINGS: Small metallic BBs overlie the left chest and central chest, unchanged from prior. The heart size and mediastinal contours are within normal limits. Both lungs are clear. The visualized skeletal structures are unremarkable. IMPRESSION: No active disease. Electronically Signed   By: Darliss Cheney M.D.   On: 08/01/2023 23:59    Microbiology: Results for orders placed or performed during the hospital encounter of  08/01/23  Resp panel by RT-PCR (RSV, Flu A&B, Covid) Anterior Nasal Swab     Status: None   Collection Time: 08/01/23 10:09 PM   Specimen: Anterior Nasal Swab  Result Value Ref Range Status   SARS Coronavirus 2 by RT PCR NEGATIVE NEGATIVE Final    Comment: (NOTE) SARS-CoV-2 target nucleic acids are NOT DETECTED.  The SARS-CoV-2 RNA is generally detectable in upper respiratory specimens during the acute phase of infection. The lowest concentration of SARS-CoV-2 viral copies this assay can detect is 138 copies/mL. A negative result does not preclude SARS-Cov-2 infection and should not be used as the sole basis for treatment or other patient management decisions. A negative result may occur with  improper specimen collection/handling, submission of specimen other than nasopharyngeal swab, presence of viral mutation(s) within the areas targeted by this assay, and inadequate number of viral copies(<138 copies/mL). A negative result must be combined with clinical observations, patient history, and epidemiological information. The expected result is Negative.  Fact Sheet for Patients:  BloggerCourse.com  Fact Sheet for Healthcare Providers:  SeriousBroker.it  This test is no t yet approved or cleared by the [place] FDA and  has been authorized for detection and/or diagnosis of SARS-CoV-2 by FDA under an Emergency Use Authorization (EUA). This EUA will remain  in effect (meaning this test can be used) for the duration of the COVID-19 declaration under Section 564(b)(1) of the Act, 21 U.S.C.section 360bbb-3(b)(1), unless the authorization is terminated  or revoked sooner.       Influenza A by PCR NEGATIVE NEGATIVE Final   Influenza B by PCR NEGATIVE NEGATIVE Final    Comment: (NOTE) The Xpert Xpress SARS-CoV-2/FLU/RSV plus assay is intended as an aid in the diagnosis of influenza from Nasopharyngeal swab specimens and should not  be used as a sole basis for treatment. Nasal washings and aspirates are unacceptable for Xpert Xpress SARS-CoV-2/FLU/RSV testing.  Fact Sheet for Patients: BloggerCourse.com  Fact Sheet for Healthcare Providers: SeriousBroker.it  This test is not yet approved or cleared by the [place] FDA and has been authorized for detection and/or diagnosis of SARS-CoV-2 by FDA under an Emergency Use Authorization (EUA). This EUA will remain in effect (meaning this test can be used) for the duration of the COVID-19 declaration under Section 564(b)(1) of the Act, 21 U.S.C. section 360bbb-3(b)(1), unless the authorization is terminated or revoked.     Resp Syncytial Virus by PCR NEGATIVE NEGATIVE Final    Comment: (NOTE) Fact Sheet for Patients: BloggerCourse.com  Fact Sheet for Healthcare Providers: SeriousBroker.it  This test is not yet approved or cleared by the [place] FDA and has been authorized for detection and/or diagnosis of SARS-CoV-2 by FDA under an Emergency Use Authorization (EUA). This EUA will remain in effect (meaning this test can be used) for the duration of the COVID-19 declaration under Section 564(b)(1) of the Act, 21 U.S.C. section 360bbb-3(b)(1), unless the authorization is terminated or revoked.  Performed at [facility], [address] [place], [place] [address]   Blood Culture (routine x 2)     Status: None   Collection Time: 08/01/23 10:09 PM   Specimen: BLOOD  Result Value Ref Range Status   Specimen Description BLOOD RIGHT ARM  Final   Special Requests   Final    BOTTLES DRAWN AEROBIC AND ANAEROBIC Blood Culture adequate volume   Culture   Final    NO GROWTH 5 DAYS Performed at [facility], [address] [place], [place] [address]    Report Status 08/06/2023 FINAL  Final  Blood Culture (routine x 2)     Status: None    Collection Time: 08/01/23 10:09 PM   Specimen: BLOOD  Result Value Ref Range Status   Specimen Description BLOOD LEFT WRIST  Final   Special Requests   Final    BOTTLES DRAWN AEROBIC AND ANAEROBIC Blood Culture adequate volume   Culture   Final    NO GROWTH 5 DAYS Performed at [facility], [address] [place], [place] [address]    Report Status 08/06/2023 FINAL  Final  Culture, blood (Routine X 2) w Reflex to ID Panel     Status: None   Collection Time: 08/06/23  5:42 PM   Specimen: BLOOD  Result Value Ref Range Status   Specimen Description BLOOD BLOOD LEFT ARM  Final   Special Requests   Final    BOTTLES DRAWN AEROBIC ONLY Blood Culture results may not be optimal due to an inadequate volume of blood received  in culture bottles   Culture   Final    NO GROWTH 5 DAYS Performed at [facility], [address] [place], [place] [address]    Report Status 08/11/2023 FINAL  Final  Culture, blood (Routine X 2) w Reflex to ID Panel     Status: None   Collection Time: 08/06/23  5:42 PM   Specimen: BLOOD  Result Value Ref Range Status   Specimen Description BLOOD BLOOD LEFT HAND  Final   Special Requests   Final    BOTTLES DRAWN AEROBIC AND ANAEROBIC Blood Culture results may not be optimal due to an excessive volume of blood received in culture bottles   Culture   Final    NO GROWTH 5 DAYS Performed at [facility], [address] [place], [place] [address]    Report Status 08/11/2023 FINAL  Final    Labs: CBC: Recent Labs  Lab 08/05/23 0453 08/06/23 0441 08/07/23 0343 08/09/23 0354  WBC 12.0* 9.8 10.6* 10.1  HGB 15.2 15.7 15.3 15.2  HCT 43.1 44.3 44.2 43.7  MCV 94.7 94.9 95.1 97.1  PLT 234 253 296 317   Basic Metabolic Panel: Recent Labs  Lab 08/05/23 0453 08/06/23 0441 08/07/23 0343 08/08/23 0443 08/09/23 0354 08/10/23 0543 08/11/23 0526  NA 135 136 136  --  134*  --   --   K 3.4* 3.4* 3.6  --  3.5  --   --   CL 102 102 105   --  101  --   --   CO2 21* 24 23  --  24  --   --   GLUCOSE 126* 129* 170*  --  186*  --   --   BUN 21 25* 29*  --  27*  --   --   CREATININE 0.78 0.70 0.79  --  0.83 0.83 0.76  CALCIUM 8.2* 8.3* 8.3*  --  8.0*  --   --   MG  --   --  2.0 1.9 1.9  --   --    Liver Function Tests: No results for input(s): "AST", "ALT", "ALKPHOS", "BILITOT", "PROT", "ALBUMIN" in the last 168 hours. CBG: Recent Labs  Lab 08/10/23 1750 08/10/23 2117 08/11/23 0010 08/11/23 0413 08/11/23 0810  GLUCAP 204* 220* 172* 167* 177*    Discharge time spent: greater than 30 minutes.  Signed: Lurene Shadow, MD [facility] 08/11/2023

## 2023-08-11 NOTE — Progress Notes (Addendum)
Patient ID: Jeremy Valencia, male   DOB: 11-20-1940, 82 y.o.   MRN: [id number]  Pt arrived to 4W25 per ground ambulance. Pt/family oriented to rehab and policies reviewed and in agreement. Assessment complete, vitals obtained. No complications noted. Mylo Red, LPN

## 2023-08-11 NOTE — Progress Notes (Addendum)
Pharmacy Antibiotic Note  Jeremy Valencia is a 82 y.o. 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  Pharmacy has been consulted for vancomycin dosing. 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    Today, 08/11/2023 Day 2 vancomycin, Day 9 ceftriaxone Renal: SCr WNL, stable WBC WNL Afebrile Blood cultures from 11/26 and 12/1 were no growth 11/30 TTE with MV vegetation and 12/3 TEE confirmed large MV vegetation Bartonella antibodies negative Coxiella antibodies negative Beta-D glucan pending Karius send out test pending  Vancomycin levels:  12/6 on vancomycin 1250mg  IV q12h, dose given 12/5 at 18:08 (prior to 3rd maintenance dose, 4th dose if include loading dose), vancomycin trough = 17 mcg/ml at 0526  Plan: Continue vancomycin 1250 mg IV every 12 hours.  Patient looks to be currently meeting predicted pharmacokinetic parameters.  Goal AUC 400-600 with preferred vancomycin trough goal 15-20 mcg/ml for brain emboli Expected AUC: 551 (predicted trough 16.7 mcg/ml) SCr used: 0.83 mg/dL Ceftriaxone 2gm IV V78I (since brain emboli) Follow renal function closely - will check 12/7 am Due to age, I am concerned about vancomycin accumulation as 12/6 trough check early in treatment course so would recommend rechecking trough (or peak/trough) in approximately 48hr Plan is for patient to transfer to [facility] rehab   Pharmacy will continue to follow and will adjust abx dosing whenever warranted.  Temp (24hrs), Avg:97.6 F (36.4 C), Min:97.3 F (36.3 C), Max:97.8 F (36.6 C)   Recent Labs  Lab 08/05/23 0453 08/06/23 0441 08/07/23 0343 08/09/23 0354 08/10/23 0543 08/11/23 0526  WBC 12.0* 9.8 10.6* 10.1  --   --    CREATININE 0.78 0.70 0.79 0.83 0.83 0.76  VANCOTROUGH  --   --   --   --   --  17    Estimated Creatinine Clearance: 77.7 mL/min (by C-G formula based on SCr of 0.76 mg/dL).    Allergies  Allergen Reactions   Metformin Diarrhea   Rofecoxib Other (See Comments)    Other reaction(s): Unknown   Lamisil [Terbinafine] Rash    Antimicrobials this admission:  11/27cefepime >> 11/28 11/27 metronidazole>> 11/28 11/28 doxycycline >> 12/03 11/28 ceftriaxone >> 11/30  12/02 >>  11/27 vancomycin >> 11/28  12/04 >>   Microbiology results: 11/26 BCx: NG final 12/01 Bcx NG final  Thank you for allowing pharmacy to be a part of this patient's care. Juliette Alcide, PharmD, BCPS, BCIDP Work Cell: [phone] 08/11/2023 8:33 AM

## 2023-08-11 NOTE — Progress Notes (Signed)
Patient discharging to [facility]. Report called to [facility] the admitting nurse. [facility] will be providing transportation to accepting facility. Patient will keep PICC line in place for long term antibiotics. Discharge instructions, chart and paperwork all placed into packet for [facility]. Patient family present and aware of discharge.

## 2023-08-11 NOTE — Progress Notes (Signed)
    Ranelle Oyster, MD  Physician Physical Medicine and Rehabilitation   Progress Notes    Signed   Date of Service: 08/04/2023 11:33 AM  Related encounter: ED to Hosp-Admission (Discharged) from 08/01/2023 in [facility] GENER[facility]   Signed      Show:Clear all [x] Written[x] Templated[] Copied  Added by: [x] Ranelle Oyster, MD  [] Hover for details [facility] PHYSICAL MEDICINE AND REHABILITATION  CONSULT SERVICE NOTE     Pt discussed with rehab admissions coordinator. Chart has been reviewed. This is a 82 yo male admitted with fever/chills and was found to be septic. HCT revealed old strokes, but pt developed weakness in his right arm and a follow up CT revealed a hypodensity in the right frontal lobe. Neurology recommended restarting ASA and adding plavix. MRI could not be performed d/t he has metal in body. Pt was up with PT and OT on 11/27 and was min to mod assist for transfers and took a few lateral steps next to bed with min assist.  Pt lives with spouse in one level house with 1-2 steps to enter. He was independent and driving PTA.         Assessment: Likely acute to subacute infarct in the posterior right frontal lobe Debility related to SIRS/sepsis.    Plan:   This patient would benefit from acute inpatient rehab to address functional mobility and self-care. Additionally, the patient requires daily MD oversight of the active medical issues noted above. Dispo and social supports are appropriate. ELOS 12-18 days with supervision goals.      Rehab Admissions Coordinator to follow up.     Ranelle Oyster, MD, [facility] Physical Medicine & Rehabilitation Medical Director Rehabilitation Services 08/04/2023
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
## 2023-08-11 NOTE — Progress Notes (Addendum)
Inpatient Rehabilitation Admissions Coordinator   I have insurance approval and CIR bed available to admit him to today. Acute team and TOC made aware. I will arrange [facility] transport once specific room number identified.   Ottie Glazier, RN, MSN Rehab Admissions Coordinator [phone] 08/11/2023 8:44 AM  [facility] called and he will be admitted to room 4 M 08 by Dr Elijah Birk.  Ottie Glazier, RN, MSN Rehab Admissions Coordinator [phone] 08/11/2023 9:54 AM

## 2023-08-11 NOTE — TOC Transition Note (Signed)
Transition of Care [facility]) - CM/SW Discharge Note   Patient Details  Name: Jeremy Valencia MRN: [id number] Date of Birth: 1941/04/17  [facility] CM/SW Contact:  Chapman Fitch, RN Phone Number: 08/11/2023, 9:27 AM   Clinical Narrative:    Notified patient to discharge to CIR today [name] with CIR to coordinate and has arranged [facility]      Barriers to Discharge: Continued Medical Work up   Patient Goals and CMS Choice CMS Medicare.gov Compare Post Acute Care list provided to:: Patient Represenative (must comment) (Wife)    Discharge Placement                         Discharge Plan and Services Additional resources added to the After Visit Summary for       Post Acute Care Choice: IP Rehab                               Social Determinants of Health (SDOH) Interventions SDOH Screenings   Food Insecurity: Patient Unable To Answer (08/03/2023)  Housing: Patient Unable To Answer (08/03/2023)  Transportation Needs: Patient Unable To Answer (08/03/2023)  Utilities: Patient Unable To Answer (08/03/2023)  Tobacco Use: Medium Risk (08/02/2023)     Readmission Risk Interventions     No data to display

## 2023-08-12 DIAGNOSIS — I6310 Cerebral infarction due to embolism of unspecified precerebral artery: Secondary | ICD-10-CM | POA: Diagnosis not present

## 2023-08-12 LAB — GLUCOSE, CAPILLARY
Glucose-Capillary: 175 mg/dL — ABNORMAL HIGH (ref 70–99)
Glucose-Capillary: 221 mg/dL — ABNORMAL HIGH (ref 70–99)
Glucose-Capillary: 276 mg/dL — ABNORMAL HIGH (ref 70–99)
Glucose-Capillary: 145 mg/dL — ABNORMAL HIGH (ref 70–99)

## 2023-08-12 MED ORDER — MAGNESIUM GLUCONATE 500 MG PO TABS
250.0000 mg | ORAL_TABLET | Freq: Every day | ORAL | Status: DC
  Administered 2023-08-12 – 2023-08-23 (×12): 250 mg via ORAL
  Filled 2023-08-12 (×12): qty 1

## 2023-08-12 MED ORDER — L-METHYLFOLATE-B6-B12 3-35-2 MG PO TABS
1.0000 | ORAL_TABLET | Freq: Every day | ORAL | Status: DC
  Administered 2023-08-12 – 2023-08-13 (×2): 1 via ORAL
  Filled 2023-08-12 (×2): qty 1

## 2023-08-12 MED ORDER — CHLORHEXIDINE GLUCONATE CLOTH 2 % EX PADS
6.0000 | MEDICATED_PAD | Freq: Every day | CUTANEOUS | Status: DC
  Administered 2023-08-12 – 2023-08-15 (×4): 6 via TOPICAL

## 2023-08-12 MED ORDER — VITAMIN D 25 MCG (1000 UNIT) PO TABS
1000.0000 [IU] | ORAL_TABLET | Freq: Every day | ORAL | Status: DC
Start: 2023-08-12 — End: 2023-08-13
  Administered 2023-08-12 – 2023-08-13 (×2): 1000 [IU] via ORAL
  Filled 2023-08-12 (×2): qty 1
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
## 2023-08-12 NOTE — Plan of Care (Signed)
  Problem: RH Swallowing Goal: LTG Patient will consume least restrictive diet using compensatory strategies with assistance (SLP) Description: LTG:  Patient will consume least restrictive diet using compensatory strategies with assistance (SLP) Flowsheets (Taken 08/12/2023 1012) LTG: Pt Patient will consume least restrictive diet using compensatory strategies with assistance of (SLP): Modified Independent   Problem: RH Problem Solving Goal: LTG Patient will demonstrate problem solving for (SLP) Description: LTG:  Patient will demonstrate problem solving for basic/complex daily situations with cues  (SLP) Flowsheets (Taken 08/12/2023 1012) LTG: Patient will demonstrate problem solving for (SLP): Basic daily situations LTG Patient will demonstrate problem solving for: Minimal Assistance - Patient > 75%   Problem: RH Memory Goal: LTG Patient will use memory compensatory aids to (SLP) Description: LTG:  Patient will use memory compensatory aids to recall biographical/new, daily complex information with cues (SLP) Flowsheets (Taken 08/12/2023 1012) LTG: Patient will use memory compensatory aids to (SLP): Minimal Assistance - Patient > 75%   Problem: RH Awareness Goal: LTG: Patient will demonstrate awareness during functional activites type of (SLP) Description: LTG: Patient will demonstrate awareness during functional activites type of (SLP) Flowsheets (Taken 08/12/2023 1012) Patient will demonstrate during cognitive/linguistic activities awareness type of: Intellectual LTG: Patient will demonstrate awareness during cognitive/linguistic activities with assistance of (SLP): Minimal Assistance - Patient > 75%

## 2023-08-12 NOTE — Progress Notes (Signed)
PROGRESS NOTE   Subjective/Complaints: No new complaints this morning D and B supplements started Labs reviewed from yesterday and were stable  ROS: +weakness  Objective:   No results found. Recent Labs    08/11/23 1310  WBC 9.8  HGB 15.4  HCT 44.3  PLT 373   Recent Labs    08/11/23 0526 08/11/23 1310  NA  --  135  K  --  4.1  CL  --  101  CO2  --  25  GLUCOSE  --  207*  BUN  --  15  CREATININE 0.76 0.82  CALCIUM  --  8.6*    Intake/Output Summary (Last 24 hours) at 08/12/2023 1517 Last data filed at 08/12/2023 1422 Gross per 24 hour  Intake 1185 ml  Output 221 ml  Net 964 ml        Physical Exam: Vital Signs Blood pressure 106/64, pulse 88, temperature 98.4 F (36.9 C), temperature source Oral, resp. rate 17, height 6\' 1"  (1.854 m), weight 86.9 kg, SpO2 95%. Gen: no distress, normal appearing HEENT: oral mucosa pink and moist, NCAT Cardio: Reg rate Chest: normal effort, normal rate of breathing Abd: soft, non-distended Ext: no edema Psych: pleasant, normal affect Skin: intact GU:  +catheter, draining clear urine.  Skin: + Left PICC line, intact. Diffuse, flat erythematous rash along the entire back and bilateral buttocks.  Blanchable, without any apparent pustules.          MSK:      No apparent deformity.  Mild swelling in right hand.       Neurologic exam:  Cognition: AAO to person, place, time and event.  Follows all simple commands, difficulty with higher-level cognitive tasks. Language: Fluent, No substitutions or neoglisms. No dysarthria. Names 3/3 objects correctly.  Memory: Recalls 0/3 objects at 5 minutes.  Insight: Good insight into current condition.  Mood: Pleasant affect, appropriate mood.  Flat sense of humor. Sensation: To light touch intact in BL UEs and LEs  Reflexes: 2+ in BL UE and LEs. Negative Hoffman's and babinski signs bilaterally.  CN: 2-12 grossly intact.   Coordination: No apparent tremors. No ataxia on LUE FTN, HTS bilaterally.  Spasticity: MAS 0 in all extremities.  Mild flexor tone in right distal fingers. Strength:                RUE: 3+/5 SA, 4/5 EF, 4/5 EE, 2+/5 WE, 3/5 FF, 3/5 FA                 LUE: 5/5 SA, 5/5 EF, 5/5 EE, 5/5 WE, 5/5 FF, 5/5 FA                 RLE: 4/5 HF, 4/5 KE, 4/5 DF, 4/5 EHL, 4/5 PF                 LLE:  5/5 HF, 5/5 KE, 5/5 DF, 5/5 EHL, 5/5 PF     Assessment/Plan: 1. Functional deficits which require 3+ hours per day of interdisciplinary therapy in a comprehensive inpatient rehab setting. Physiatrist is providing close team supervision and 24 hour management of active medical problems listed below. Physiatrist and rehab team continue  to assess barriers to discharge/monitor patient progress toward functional and medical goals  Care Tool:  Bathing              Bathing assist       Upper Body Dressing/Undressing Upper body dressing        Upper body assist      Lower Body Dressing/Undressing Lower body dressing            Lower body assist       Toileting Toileting    Toileting assist       Transfers Chair/bed transfer  Transfers assist     Chair/bed transfer assist level: Moderate Assistance - Patient 50 - 74%     Locomotion Ambulation   Ambulation assist      Assist level: Minimal Assistance - Patient > 75% Assistive device: Walker-rolling Max distance: 20   Walk 10 feet activity   Assist     Assist level: Minimal Assistance - Patient > 75% Assistive device: Walker-rolling   Walk 50 feet activity   Assist Walk 50 feet with 2 turns activity did not occur: Safety/medical concerns         Walk 150 feet activity   Assist Walk 150 feet activity did not occur: Safety/medical concerns         Walk 10 feet on uneven surface  activity   Assist Walk 10 feet on uneven surfaces activity did not occur: Safety/medical concerns          Wheelchair     Assist Is the patient using a wheelchair?: Yes Type of Wheelchair: Manual    Wheelchair assist level: Moderate Assistance - Patient 50 - 74% Max wheelchair distance: 50    Wheelchair 50 feet with 2 turns activity    Assist        Assist Level: Moderate Assistance - Patient 50 - 74%   Wheelchair 150 feet activity     Assist      Assist Level: Total Assistance - Patient < 25%   Blood pressure 106/64, pulse 88, temperature 98.4 F (36.9 C), temperature source Oral, resp. rate 17, height 6\' 1"  (1.854 m), weight 86.9 kg, SpO2 95%.  Medical Problem List and Plan: 1. 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  Metanx and Vitamin D started   2.  Antithrombotics: -DVT/anticoagulation:  Pharmaceutical: Lovenox             -antiplatelet therapy: Aspirin and Plavix indefinitely (cox-2 inhibitor allergy but has tolerated aspirin in the past)   3. 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 Mood/Behavior/Sleep: LCSW to evaluate and provide emotional support             -antipsychotic agents: n/a   5. Neuropsych/cognition: This patient is capable of making decisions on his own behalf.   6. Skin/Wound Care: Routine skin care checks   7. Fluids/Electrolytes/Nutrition: Routine Is and Os and follow-up chemistries             -  dysphagia 3 diet/thin liquids             -SLP eval   8: Hypertension: monitor TID and prn (home hydrochlorothiazide 25 mg BID held)             -continue hydralazine 25 mg TID             -continue lisinopril 20 mg daily  -magnesium gluconate 250mg  started HS   9: Hyperlipidemia: continue statin   10: Metabolic  encephalopathy in the setting of sepsis, with possible cefepime neurotoxicity as a contributing factor: see #15   11: DM: CBGs QID, A1c = 7.3% (home regimen: glimepiride 4 mg q AM, Jardiance 10 mg daily)             -continue SSI             -continue Semglee 5 units q HS             -monitor intake and consider restarting home meds   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 Incentive spirometer ordered   13: Glaucoma: continue Timoptic; has prosthetic right eye   14: Gout/no acute flare: home on colchicine 0.6 mg daily as needed   15: Mitral valve vegetation/sepsis: continue cetriaxone 2 g daily and vnacomycin 1250 mg BID for total of 6 weeks of therapy  (end date 09/12/2023)   16: Mildly elevated Bun: ensure adequate PO liquids and follow-up BMP   17.  Diffuse urticarial rash.  Change hydrocortisone cream twice daily to Benadryl cream twice daily, add as needed Benadryl 25 mg every 6 hours for itching and sleep.  Discontinue Chlorhex baths   18. Urinary incontinence. PVRs Q6H, ISC for >350 ccs. Messaged nursing to discuss how his PVRs have been.     LOS: 1 days A FACE TO FACE EVALUATION WAS PERFORMED  Drema Pry Grazia Taffe 08/12/2023, 3:17 PM

## 2023-08-12 NOTE — Plan of Care (Signed)
  Problem: Consults Goal: RH STROKE PATIENT EDUCATION Description: See Patient Education module for education specifics  Outcome: Progressing   Problem: RH BOWEL ELIMINATION Goal: RH STG MANAGE BOWEL WITH ASSISTANCE Description: STG Manage Bowel with mod I Assistance. Outcome: Progressing Goal: RH STG MANAGE BOWEL W/MEDICATION W/ASSISTANCE Description: STG Manage Bowel with Medication with mod I Assistance. Outcome: Progressing   Problem: RH BLADDER ELIMINATION Goal: RH STG MANAGE BLADDER WITH ASSISTANCE Description: STG Manage Bladder With mod I Assistance Outcome: Progressing Goal: RH STG MANAGE BLADDER WITH MEDICATION WITH ASSISTANCE Description: STG Manage Bladder With Medication With mod I Assistance. Outcome: Progressing   Problem: RH SAFETY Goal: RH STG ADHERE TO SAFETY PRECAUTIONS W/ASSISTANCE/DEVICE Description: STG Adhere to Safety Precautions With cues Assistance/Device. Outcome: Progressing   Problem: RH PAIN MANAGEMENT Goal: RH STG PAIN MANAGED AT OR BELOW PT'S PAIN GOAL Description: < 4 with prns Outcome: Progressing   Problem: RH KNOWLEDGE DEFICIT Goal: RH STG INCREASE KNOWLEDGE OF DIABETES Description: Patient and spouse will be able to manage DM with medications and dietary modifications using educational resources independently Outcome: Progressing Goal: RH STG INCREASE KNOWLEDGE OF HYPERTENSION Description: Patient and spouse will be able to manage HTN with medications and dietary modifications using educational resources independently Outcome: Progressing Goal: RH STG INCREASE KNOWLEDGE OF DYSPHAGIA/FLUID INTAKE Description: Patient and spouse will be able to manage Dysphagia, medications and dietary modifications using educational resources independently Outcome: Progressing Goal: RH STG INCREASE KNOWLEGDE OF HYPERLIPIDEMIA Description: Patient and spouse will be able to manage HLD with medications and dietary modifications using educational resources  independently Outcome: Progressing Goal: RH STG INCREASE KNOWLEDGE OF 

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
## 2023-08-13 DIAGNOSIS — I6310 Cerebral infarction due to embolism of unspecified precerebral artery: Secondary | ICD-10-CM | POA: Diagnosis not present

## 2023-08-13 LAB — GLUCOSE, CAPILLARY
Glucose-Capillary: 142 mg/dL — ABNORMAL HIGH (ref 70–99)
Glucose-Capillary: 222 mg/dL — ABNORMAL HIGH (ref 70–99)
Glucose-Capillary: 215 mg/dL — ABNORMAL HIGH (ref 70–99)
Glucose-Capillary: 189 mg/dL — ABNORMAL HIGH (ref 70–99)

## 2023-08-13 LAB — VANCOMYCIN, TROUGH: Vancomycin Tr: 23 ug/mL (ref 15–20)

## 2023-08-13 MED ORDER — VANCOMYCIN HCL IN DEXTROSE 1-5 GM/200ML-% IV SOLN
1000.0000 mg | Freq: Two times a day (BID) | INTRAVENOUS | Status: DC
Start: 2023-08-13 — End: 2023-08-15
  Administered 2023-08-13 – 2023-08-15 (×5): 1000 mg via INTRAVENOUS
  Filled 2023-08-13 (×8): qty 200

## 2023-08-13 MED ORDER — B COMPLEX-C PO TABS
1.0000 | ORAL_TABLET | Freq: Every day | ORAL | Status: DC
  Administered 2023-08-13 – 2023-08-24 (×12): 1 via ORAL
  Filled 2023-08-13 (×12): qty 1

## 2023-08-13 MED ORDER — EMPAGLIFLOZIN 10 MG PO TABS
10.0000 mg | ORAL_TABLET | Freq: Every day | ORAL | Status: DC
  Administered 2023-08-13 – 2023-08-18 (×6): 10 mg via ORAL
  Filled 2023-08-13 (×6): qty 1

## 2023-08-13 MED ORDER — VITAMIN D 25 MCG (1000 UNIT) PO TABS
2000.0000 [IU] | ORAL_TABLET | Freq: Every day | ORAL | Status: DC
  Administered 2023-08-14 – 2023-08-21 (×8): 2000 [IU] via ORAL
  Filled 2023-08-13 (×8): qty 2

## 2023-08-13 MED ORDER — LISINOPRIL 10 MG PO TABS
10.0000 mg | ORAL_TABLET | Freq: Every day | ORAL | Status: DC
  Administered 2023-08-14 – 2023-08-24 (×11): 10 mg via ORAL
  Filled 2023-08-13 (×12): qty 1

## 2023-08-13 NOTE — Plan of Care (Signed)
  Problem: Consults Goal: RH STROKE PATIENT EDUCATION Description: See Patient Education module for education specifics  Outcome: Progressing   Problem: RH BOWEL ELIMINATION Goal: RH STG MANAGE BOWEL WITH ASSISTANCE Description: STG Manage Bowel with mod I Assistance. Outcome: Progressing Goal: RH STG MANAGE BOWEL W/MEDICATION W/ASSISTANCE Description: STG Manage Bowel with Medication with mod I Assistance. Outcome: Progressing   Problem: RH BLADDER ELIMINATION Goal: RH STG MANAGE BLADDER WITH ASSISTANCE Description: STG Manage Bladder With mod I Assistance Outcome: Progressing Goal: RH STG MANAGE BLADDER WITH MEDICATION WITH ASSISTANCE Description: STG Manage Bladder With Medication With mod I Assistance. Outcome: Progressing   Problem: RH SAFETY Goal: RH STG ADHERE TO SAFETY PRECAUTIONS W/ASSISTANCE/DEVICE Description: STG Adhere to Safety Precautions With cues Assistance/Device. Outcome: Progressing   Problem: RH PAIN MANAGEMENT Goal: RH STG PAIN MANAGED AT OR BELOW PT'S PAIN GOAL Description: < 4 with prns Outcome: Progressing   Problem: RH KNOWLEDGE DEFICIT Goal: RH STG INCREASE KNOWLEDGE OF DIABETES Description: Patient and spouse will be able to manage DM with medications and dietary modifications using educational resources independently Outcome: Progressing Goal: RH STG INCREASE KNOWLEDGE OF HYPERTENSION Description: Patient and spouse will be able to manage HTN with medications and dietary modifications using educational resources independently Outcome: Progressing Goal: RH STG INCREASE KNOWLEDGE OF DYSPHAGIA/FLUID INTAKE Description: Patient and spouse will be able to manage Dysphagia, medications and dietary modifications using educational resources independently Outcome: Progressing Goal: RH STG INCREASE KNOWLEGDE OF HYPERLIPIDEMIA Description: Patient and spouse will be able to manage HLD with medications and dietary modifications using educational resources  independently Outcome: Progressing Goal: RH STG INCREASE KNOWLEDGE OF 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

## 2023-08-13 NOTE — Progress Notes (Signed)
Dr. Dalene Carrow and pharmacy notified of critical Vancomycin trough 23.

## 2023-08-13 NOTE — Progress Notes (Signed)
Pharmacy Antibiotic Note  Jeremy Valencia is a 82 y.o. 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  -VT returned at 23 (above goal of 15-20), drawn appropriately - Kidney function has been stable  Plan: -Reduce vancomycin to 1000mg  IV every 12 hours (VT ~18) -Monitor renal function -Get repeat vancomycin trough after 4th dose of vancomycin   Height: 6\' 1"  (185.4 cm) Weight: 86.9 kg (191 lb 9.3 oz) IBW/kg (Calculated) : 79.9  Temp (24hrs), Avg:98.1 F (36.7 C), Min:97.8 F (36.6 C), Max:98.4 F (36.9 C)  Recent Labs  Lab 08/07/23 0343 08/09/23 0354 08/10/23 0543 08/11/23 0526 08/11/23 1310 08/13/23 0501  WBC 10.6* 10.1  --   --  9.8  --   CREATININE 0.79 0.83 0.83 0.76 0.82  --   VANCOTROUGH  --   --   --  17  --  23*    Estimated Creatinine Clearance: 75.8 mL/min (by C-G formula based on SCr of 0.82 mg/dL).    Allergies  Allergen Reactions   Metformin Diarrhea   Rofecoxib Other (See Comments)    Other reaction(s): Unknown   Lamisil [Terbinafine] Rash    Antimicrobials this admission: Vancomycin 12/6 >> (09/12/2023)  Thank you for allowing pharmacy to be a part of this patient's care.  Arabella Merles, PharmD. Clinical Pharmacist 08/13/2023 6:07 AM

## 2023-08-13 NOTE — Progress Notes (Signed)
PROGRESS NOTE   Subjective/Complaints: No new complaints this morning Asks for assistance to call his daughter Appreciate pharm assistance adjusting vac dose  ROS: +focal weakness  Objective:   No results found. Recent Labs    08/11/23 1310  WBC 9.8  HGB 15.4  HCT 44.3  PLT 373   Recent Labs    08/11/23 0526 08/11/23 1310  NA  --  135  K  --  4.1  CL  --  101  CO2  --  25  GLUCOSE  --  207*  BUN  --  15  CREATININE 0.76 0.82  CALCIUM  --  8.6*    Intake/Output Summary (Last 24 hours) at 08/13/2023 1705 Last data filed at 08/13/2023 1621 Gross per 24 hour  Intake 437 ml  Output --  Net 437 ml        Physical Exam: Vital Signs Blood pressure 118/68, pulse 81, temperature 98.2 F (36.8 C), temperature source Oral, resp. rate 18, height 6\' 1"  (1.854 m), weight 86.9 kg, SpO2 97%. Gen: no distress, normal appearing HEENT: oral mucosa pink and moist, NCAT Cardio: Reg rate Chest: normal effort, normal rate of breathing Abd: soft, non-distended Ext: no edema Psych: pleasant, normal affect Skin: intact GU:  +catheter, draining clear urine.  Skin: + Left PICC line, intact. Diffuse, flat erythematous rash along the entire back and bilateral buttocks.  Blanchable, without any apparent pustules.          MSK:      No apparent deformity.  Mild swelling in right hand.       Neurologic exam:  Cognition: AAO to person, place, time and event.  Follows all simple commands, difficulty with higher-level cognitive tasks. Language: Fluent, No substitutions or neoglisms. No dysarthria. Names 3/3 objects correctly.  Memory: Recalls 0/3 objects at 5 minutes.  Insight: Good insight into current condition.  Mood: Pleasant affect, appropriate mood.  Flat sense of humor. Sensation: To light touch intact in BL UEs and LEs  Reflexes: 2+ in BL UE and LEs. Negative Hoffman's and babinski signs bilaterally.  CN: 2-12  grossly intact.  Coordination: No apparent tremors. No ataxia on LUE FTN, HTS bilaterally.  Spasticity: MAS 0 in all extremities.  Mild flexor tone in right distal fingers. Strength:                RUE: 3+/5 SA, 4/5 EF, 4/5 EE, 2+/5 WE, 3/5 FF, 3/5 FA                 LUE: 5/5 SA, 5/5 EF, 5/5 EE, 5/5 WE, 5/5 FF, 5/5 FA                 RLE: 4/5 HF, 4/5 KE, 4/5 DF, 4/5 EHL, 4/5 PF                 LLE:  5/5 HF, 5/5 KE, 5/5 DF, 5/5 EHL, 5/5 PF, stable 12/8    Assessment/Plan: 1. Functional deficits which require 3+ hours per day of interdisciplinary therapy in a comprehensive inpatient rehab setting. Physiatrist is providing close team supervision and 24 hour management of active medical problems listed below. Physiatrist and rehab  team continue to assess barriers to discharge/monitor patient progress toward functional and medical goals  Care Tool:  Bathing    Body parts bathed by patient: Right arm, Left arm, Chest, Abdomen, Front perineal area, Face   Body parts bathed by helper: Buttocks, Right upper leg, Left upper leg, Right lower leg, Left lower leg     Bathing assist Assist Level: Moderate Assistance - Patient 50 - 74%     Upper Body Dressing/Undressing Upper body dressing   What is the patient wearing?: Pull over shirt    Upper body assist Assist Level: Moderate Assistance - Patient 50 - 74%    Lower Body Dressing/Undressing Lower body dressing      What is the patient wearing?: Incontinence brief     Lower body assist Assist for lower body dressing: Maximal Assistance - Patient 25 - 49%     Toileting Toileting    Toileting assist Assist for toileting: Dependent - Patient 0%     Transfers Chair/bed transfer  Transfers assist     Chair/bed transfer assist level: Moderate Assistance - Patient 50 - 74%     Locomotion Ambulation   Ambulation assist      Assist level: Minimal Assistance - Patient > 75% Assistive device: Walker-rolling Max distance: 20    Walk 10 feet activity   Assist     Assist level: Minimal Assistance - Patient > 75% Assistive device: Walker-rolling   Walk 50 feet activity   Assist Walk 50 feet with 2 turns activity did not occur: Safety/medical concerns         Walk 150 feet activity   Assist Walk 150 feet activity did not occur: Safety/medical concerns         Walk 10 feet on uneven surface  activity   Assist Walk 10 feet on uneven surfaces activity did not occur: Safety/medical concerns         Wheelchair     Assist Is the patient using a wheelchair?: Yes Type of Wheelchair: Manual    Wheelchair assist level: Moderate Assistance - Patient 50 - 74% Max wheelchair distance: 50    Wheelchair 50 feet with 2 turns activity    Assist        Assist Level: Moderate Assistance - Patient 50 - 74%   Wheelchair 150 feet activity     Assist      Assist Level: Total Assistance - Patient < 25%   Blood pressure 118/68, pulse 81, temperature 98.2 F (36.8 C), temperature source Oral, resp. rate 18, height 6\' 1"  (1.854 m), weight 86.9 kg, SpO2 97%.  Medical Problem List and Plan: 1. 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  Vitamin D and B supplements started   2.  Antithrombotics: -DVT/anticoagulation:  Pharmaceutical: Lovenox             -antiplatelet therapy: continue Aspirin and Plavix indefinitely (cox-2 inhibitor allergy but has tolerated aspirin in the past)   3. 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 Mood/Behavior/Sleep: LCSW to evaluate and provide emotional support             -antipsychotic agents: n/a   5. Neuropsych/cognition: This  patient is capable of making decisions on his own behalf.   6. Skin/Wound Care: Routine skin care checks   7. Fluids/Electrolytes/Nutrition: Routine Is and Os and follow-up chemistries             -dysphagia 3 diet/thin liquids             -SLP eval   8: Hypertension: monitor TID and prn (home hydrochlorothiazide 25 mg BID held)             -continue hydralazine 25 mg TID             -decrease lisinopril to 10mg   -magnesium gluconate 250mg  started HS   9: Hyperlipidemia: continue statin   10: Metabolic encephalopathy in the setting of sepsis, with possible cefepime neurotoxicity as a contributing factor: see #15   11: DM: CBGs QID, A1c = 7.3% (home regimen: glimepiride 4 mg q AM, Jardiance 10 mg daily)             -continue SSI             -continue Semglee 5 units q HS             -monitor intake and consider restarting home meds  -restart Jardiance   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 Incentive spirometer ordered   13: Glaucoma: continue Timoptic; has prosthetic right eye   14: Gout/no acute flare: home on colchicine 0.6 mg daily as needed   15: Mitral valve vegetation/sepsis: continue cetriaxone 2 g daily and vnacomycin 1250 mg BID for total of 6 weeks of therapy  (end date 09/12/2023)   16: Mildly elevated Bun: ensure adequate PO liquids and follow-up BMP   17.  Diffuse urticarial rash.  Change hydrocortisone cream twice daily to Benadryl cream twice daily, add as needed Benadryl 25 mg every 6 hours for itching and sleep.  Discontinue Chlorhex baths   18. Urinary incontinence. PVRs Q6H, ISC for >350 ccs. Messaged nursing to discuss how his PVRs have been.     LOS: 2 days A FACE TO FACE EVALUATION WAS PERFORMED  Drema Pry Evellyn Tuff 08/13/2023, 5:05 PM

## 2023-08-14 DIAGNOSIS — Z794 Long term (current) use of insulin: Secondary | ICD-10-CM

## 2023-08-14 DIAGNOSIS — I10 Essential (primary) hypertension: Secondary | ICD-10-CM | POA: Diagnosis not present

## 2023-08-14 DIAGNOSIS — R21 Rash and other nonspecific skin eruption: Secondary | ICD-10-CM

## 2023-08-14 DIAGNOSIS — G4700 Insomnia, unspecified: Secondary | ICD-10-CM | POA: Diagnosis not present

## 2023-08-14 DIAGNOSIS — E1169 Type 2 diabetes mellitus with other specified complication: Secondary | ICD-10-CM

## 2023-08-14 DIAGNOSIS — I6310 Cerebral infarction due to embolism of unspecified precerebral artery: Secondary | ICD-10-CM | POA: Diagnosis not present

## 2023-08-14 LAB — CBC
HCT: 44.8 % (ref 39.0–52.0)
Hemoglobin: 15.2 g/dL (ref 13.0–17.0)
MCH: 33.3 pg (ref 26.0–34.0)
MCHC: 33.9 g/dL (ref 30.0–36.0)
MCV: 98 fL (ref 80.0–100.0)
Platelets: 377 10*3/uL (ref 150–400)
RBC: 4.57 MIL/uL (ref 4.22–5.81)
RDW: 11.6 % (ref 11.5–15.5)
WBC: 15.1 10*3/uL — ABNORMAL HIGH (ref 4.0–10.5)
nRBC: 0 % (ref 0.0–0.2)

## 2023-08-14 LAB — FUNGITELL BETA-D-GLUCAN
Fungitell Value:: <31.25 pg/mL
Result Name:: NEGATIVE

## 2023-08-14 LAB — GLUCOSE, CAPILLARY
Glucose-Capillary: 126 mg/dL — ABNORMAL HIGH (ref 70–99)
Glucose-Capillary: 188 mg/dL — ABNORMAL HIGH (ref 70–99)
Glucose-Capillary: 208 mg/dL — ABNORMAL HIGH (ref 70–99)
Glucose-Capillary: 132 mg/dL — ABNORMAL HIGH (ref 70–99)

## 2023-08-14 LAB — BASIC METABOLIC PANEL
Anion gap: 9 (ref 5–15)
BUN: 17 mg/dL (ref 8–23)
CO2: 25 mmol/L (ref 22–32)
Calcium: 8.4 mg/dL — ABNORMAL LOW (ref 8.9–10.3)
Chloride: 103 mmol/L (ref 98–111)
Creatinine, Ser: 1.05 mg/dL (ref 0.61–1.24)
GFR, Estimated: >60 mL/min (ref >60)
Glucose, Bld: 134 mg/dL — ABNORMAL HIGH (ref 70–99)
Potassium: 3.8 mmol/L (ref 3.5–5.1)
Sodium: 137 mmol/L (ref 135–145)

## 2023-08-14 LAB — KARIUS TEST

## 2023-08-14 MED ORDER — TRAZODONE HCL 50 MG PO TABS: 25.0000 mg | ORAL_TABLET | Freq: Every evening | ORAL | Status: DC | PRN

## 2023-08-14 MED ORDER — CLOTRIMAZOLE 1 % EX CREA
TOPICAL_CREAM | Freq: Two times a day (BID) | CUTANEOUS | Status: DC
  Filled 2023-08-14: qty 15

## 2023-08-14 MED ORDER — GERHARDT'S BUTT CREAM
TOPICAL_CREAM | Freq: Three times a day (TID) | CUTANEOUS | Status: DC
  Filled 2023-08-14: qty 60

## 2023-08-14 MED ORDER — DIPHENHYDRAMINE HCL 25 MG PO CAPS: 25.0000 mg | ORAL_CAPSULE | Freq: Four times a day (QID) | ORAL | Status: DC | PRN

## 2023-08-14 MED ORDER — DIPHENHYDRAMINE-ZINC ACETATE 2-0.1 % EX CREA
TOPICAL_CREAM | Freq: Two times a day (BID) | CUTANEOUS | Status: DC
  Filled 2023-08-14: qty 28

## 2023-08-14 NOTE — Progress Notes (Signed)
Occupational Therapy Session Note  Patient Details  Name: Jeremy Valencia MRN: [id number] Date of Birth: 1941/08/07  Today's Date: 08/14/2023 OT Individual Time: 4098-1191 OT Individual Time Calculation (min): 45 min    Short Term Goals: Week 1:  OT Short Term Goal 1 (Week 1): Pt will complete UB  bathing and dressing with Min A and use of hemi dressing techniques, OT Short Term Goal 2 (Week 1): Pt will complete LB bathing and dressing with Mod A and use of AE and hemi dressing techniques. OT Short Term Goal 3 (Week 1): Pt will demonstrate improved right side awareness by turning their head and visually scanning to locate items placed on their right side in 3/6 trials with moderate verbal or tactile cueing, to support engagement in basic functional self care tasks. OT Short Term Goal 4 (Week 1): Pt will demonstrate improved toiletig skills while completing 3/3 steps with Max A.  Skilled Therapeutic Interventions/Progress Updates:      Therapy Documentation Precautions:  Precautions Precautions: Fall Precaution Comments: R Hemi Restrictions Weight Bearing Restrictions: No General: "Hello" Pt supine in bed upon OT arrival, agreeable to OT session. Pt requesting to stay in room for tx.   Pain: no pain reported  Exercises: OT completed passive stretching on RUE in preparation for therapeutic activities. Pt with increased tightness noted in wrist supinator muscles with reported pain from pt. Pt completed sponge squeezes with blue sponge with power grip, MCP digit flexion and pincer grasp with VC required for hand positioning and directions for completion. Pt with increased time to complete d/t difficulty positioning hand on sponge. Pt then worked on functional grasp/release by retrieving sponge from different locations with increased time required d/t lack of coordination. Pt completed activities for increased dexterity and coordination in order to complete ADLs such as buttoning  shirts.  Other Treatments: Pt given tan theraputty with beads in order to work on [facility] and functional grasp/pinch and coordination.    Pt supine in bed with bed alarm activated, 2 bed rails up, call light within reach and 4Ps assessed.   Therapy/Group: Individual Therapy  Velia Meyer, OTD, OTR/L 08/14/2023, 12:56 PM

## 2023-08-14 NOTE — Progress Notes (Signed)
[facility]patient Rehabilitation Center Individual Statement of Services  Patient Name:  Jeremy Valencia  Date:  08/14/2023  Welcome to the [facility]patient [facility].  Our goal is to provide you with an individualized program based on your diagnosis and situation, designed to meet your specific needs.  With this comprehensive rehabilitation program, you will be expected to participate in at least 3 hours of rehabilitation therapies Monday-Friday, with modified therapy programming on the weekends.  Your rehabilitation program will include the following services:  Physical Therapy (PT), Occupational Therapy (OT), Speech Therapy (ST), 24 hour per day rehabilitation nursing, Therapeutic Recreaction (TR), Neuropsychology, Care Coordinator, Rehabilitation Medicine, Nutrition Services, Pharmacy Services, and Other  Weekly team conferences will be held on Wednesdays to discuss your progress.  Your Inpatient Rehabilitation Care Coordinator will talk with you frequently to get your input and to update you on team discussions.  Team conferences with you and your family in attendance may also be held.  Expected length of stay: 12-18 Days  Overall anticipated outcome:  Min A  Depending on your progress and recovery, your program may change. Your Inpatient Rehabilitation Care Coordinator will coordinate services and will keep you informed of any changes. Your Inpatient Rehabilitation Care Coordinator's name and contact numbers are listed  below.  The following services may also be recommended but are not provided by the Inpatient Rehabilitation Center:   Home Health Rehabiltiation Services Outpatient Rehabilitation Services    Arrangements will be made to provide these services after discharge if needed.  Arrangements include referral to agencies that provide these services.  Your insurance has been verified to be:   [employer] Your primary doctor is:  Asa Lente, MD  Pertinent information will be  shared with your doctor and your insurance company.  Inpatient Rehabilitation Care Coordinator:  Lavera Guise, [place] [phone] or [phone]  Information discussed with and copy given to patient by: Andria Rhein, 08/14/2023, 10:39 AM

## 2023-08-14 NOTE — Progress Notes (Signed)
Patient ID: Jeremy Valencia, male   DOB: February 05, 1941, 82 y.o.   MRN: [id number]\  Sw spoke with spouse, introduced self and explained role. Spouse anticipates daughter to be present today with patient. Sw will continue to follow up and continue to attempt to assess. Pt discharging home with assistance from spouse and daughter. Spouse does not drive, pt was primary driver. Due to this spouse will not be present this upcoming Wednesday, Sw will contact spouse via telephone with team conference updates

## 2023-08-14 NOTE — Progress Notes (Signed)
Inpatient Rehabilitation  Patient information reviewed and entered into eRehab system by Oyuki Hogan M. Eri Mcevers, M.A., CCC/SLP, PPS Coordinator.  Information including medical coding, functional ability and quality indicators will be reviewed and updated through discharge.    

## 2023-08-14 NOTE — Progress Notes (Signed)
Occupational Therapy Session Note  Patient Details  Name: Jeremy Valencia MRN: [id number] Date of Birth: 02-17-1941  Today's Date: 08/14/2023 OT Individual Time: 1007-1100 & 1450-1530 OT Individual Time Calculation (min): 53 min & 40 min   Short Term Goals: Week 1:  OT Short Term Goal 1 (Week 1): Pt will complete UB  bathing and dressing with Min A and use of hemi dressing techniques, OT Short Term Goal 2 (Week 1): Pt will complete LB bathing and dressing with Mod A and use of AE and hemi dressing techniques. OT Short Term Goal 3 (Week 1): Pt will demonstrate improved right side awareness by turning their head and visually scanning to locate items placed on their right side in 3/6 trials with moderate verbal or tactile cueing, to support engagement in basic functional self care tasks. OT Short Term Goal 4 (Week 1): Pt will demonstrate improved toiletig skills while completing 3/3 steps with Max A.  Skilled Therapeutic Interventions/Progress Updates:  Session 1 Skilled OT intervention completed with focus on ADL retraining. Pt received upright in bed with nursing present administering meds, agreeable to session. Rt wrist pain reported with movement; pre-medicated. OT offered rest breaks, repositioning and notified MD of request for wrist cock up splint for day time/mobility use due to observance of pt using Rt wrist to push up and reposition self with bent wrist. OT also notified MD, PA and nurse of large rash on pt's back, upper buttocks, medial RUE and ring shaped rash on lower abdomen with pt reporting itching but no burning sensation.  Pt alert and oriented but with cognitive deficits, I.e. repeating questions already asked and poor sequencing throughout session. Required cues for initiation, attention to task and for Rt sided attention due to hemi neglect.  With gentle coaxing, agreeable to get OOB for toileting. Transitioned to EOB with min A overall for RLE and trunk elevation with pt  seeking therapist's back to pull self up but with cues provided for hands on bed and bed rail for technique, assist faded to CGA for sitting balance. Cues needed for feet to contact floor, then pt needed series of scoots to complete min A squat pivot > Rt side to w/c with pt demonstrating difficulty coordinating head hip relationship with scooting back to Rt side of chair.  Using grab bar, with mod cues for Rt hemi body, pt able to complete min A sit > stand and stand pivot > BSC. Total A to lower clothing. Pt incontinent of urinary void. Able to void further with ++ time. Cues needed to attend to RUE hanging down beside toilet prior to mobility, then stood with min A, donned new brief/pants with max A, and min A stand pivot back to w/c.  Seated at sink, pt needed supervision for hand hygiene, Max A for UB dressing with hemi technique education provided but pt with difficulty motor planning with apraxia noted. Combed hair with min A as pt is not very thorough with any self-care tasks. Donned tennis shoes with total A.  OT opened blinds to encourage orientation to time of day/for increased arousal, then pt remained seated in w/c, with belt alarm on/activated, and with all needs in reach at end of session.  Session 2 Skilled OT intervention completed with focus on RUE splint education, bed mobility, sit > stands and midline orientation. Pt received upright in bed with daughter present, not initially agreeable to get OOB however with gentle coaxing to sit EOB, pt agreeable to session. Rt wrist pain  reported; pre-medicated. OT offered splinting with resting hand splint and repositioning for pain reduction.  Pt transitioned to EOB with cues for use of bed rail using LUE vs pulling on OT, with overall supervision, then mod A needed to coordinate scooting forward for both feet to contact floor. Hanger delivered RUE resting hand splint however wrist cock up splint delayed, therefore OT educated about  donning/doffing technique, wear schedule and purpose with pt and daughter. Discussed the difference between day/night time splints and encouraged doffing at rest for proprioceptive and functional input with the dominant UE. Pt assisted with color matching of the straps, however needed max A to donn altogether. Utilized splint for trial at sit > stands for stability.  Pt stood using Lt hand on bed rail and Rt HHA with initial mod A, however on 2nd trial faded to min A with upright gaze to assist with midline. Statically pt required initial min A for balance then when asked to march in place, pt able to balance with min A during task with good RLE control. Pt c/o fatigue, declining transfer to w/c, requesting to return to bed. Pt was able to transition with supervision, then needed max cues for sequencing of bridge technique for boosting self towards HOB. Mod A overall using towel vs chuck pad as per daughter, the chuck pads are what pt had allergic reaction to at other hospital. Of note- rash on upper abdomen appearing worse than in AM session; team already aware.   Pt remained semi upright, with pillow under RUE in slight abduction for hemi positioning, bed alarm on/activated, and with all needs in reach at end of session.   Therapy Documentation Precautions:  Precautions Precautions: Fall Precaution Comments: R Hemi Restrictions Weight Bearing Restrictions: No    Therapy/Group: Individual Therapy  Melvyn Novas, MS, OTR/L  08/14/2023, 3:46 PM

## 2023-08-14 NOTE — Progress Notes (Signed)
Speech Language Pathology Daily Session Note  Patient Details  Name: Jeremy Valencia MRN: [id number] Date of Birth: September 08, 1940  Today's Date: 08/14/2023 SLP Individual Time: 4098-1191 SLP Individual Time Calculation (min): 33 min and Today's Date: 08/14/2023 SLP Missed Time: 27 Minutes Missed Time Reason: Patient fatigue  Short Term Goals: Week 1: SLP Short Term Goal 1 (Week 1): Patient will utilize swallowing compensatory strategies during consumption of D3/thin diet given supervision multimodal A SLP Short Term Goal 2 (Week 1): Patient will demonstrate problem solving abilities in basic situations given mod multimodal A SLP Short Term Goal 3 (Week 1): Patient will recall and utilize memory compensatory strategies given mod multimodal A SLP Short Term Goal 4 (Week 1): Patient will recall two cognitive and two physical changes since admission given mod multimodal A  Skilled Therapeutic Interventions:   Patient was seen in PM to address cognitive re- training. Pt was alert and seen at bedside upon SLP arrival. He endorsed fatigue however able to be redirected. Pt verbalized a vague version of recent medical hx along with PLOF. He presented with awareness of cognitive and physical changes since CVA. SLP introduced WRAP compensatory strategies and examples of utilization. Pt able to teach back information with 50% acc improving to 75% with min A. Pt requesting handout with stratgeeis listed which was provided. SLP further addressed problem solving. Given daily living scenarios presented verbally, pt identified an appropriate solution with 67% acc with mod A. As session progressed pt continued to endorse fatigue and requested discontinuation of session Therefore, pt missed 27 minutes of skilled intervention. SLP to make up minutes as able. Pt left in bed with call button within reach and bed alarm active. SLP to continue POC.   Pain Pain Assessment Pain Scale: 0-10 Pain Score: 0-No  pain  Therapy/Group: Individual Therapy  Renaee Munda 08/14/2023, 4:05 PM

## 2023-08-14 NOTE — Progress Notes (Signed)
PROGRESS NOTE   Subjective/Complaints: No acute events overnight noted.  Patient reports poor sleep overnight, reports he uses trazodone as needed PTA.  Patient also reports he took a medication for RLS, not having any symptoms of RLS at the moment.  ROS: +focal weakness, insomnia Denies chest pain, shortness of breath, abdominal pain, nausea / vomiting  Objective:   No results found. Recent Labs    08/14/23 0300  WBC 15.1*  HGB 15.2  HCT 44.8  PLT 377   Recent Labs    08/14/23 0300  NA 137  K 3.8  CL 103  CO2 25  GLUCOSE 134*  BUN 17  CREATININE 1.05  CALCIUM 8.4*    Intake/Output Summary (Last 24 hours) at 08/14/2023 1647 Last data filed at 08/14/2023 1240 Gross per 24 hour  Intake 637 ml  Output --  Net 637 ml        Physical Exam: Vital Signs Blood pressure 139/77, pulse 93, temperature 97.9 F (36.6 C), temperature source Oral, resp. rate 18, height 6\' 1"  (1.854 m), weight 86.9 kg, SpO2 96%. Gen: no distress, normal appearing, appears comfortable sitting in bed HEENT: oral mucosa pink and moist, NCAT Cardio: RRR Chest: CTAB, normal effort, normal rate of breathing Abd: soft, non-distended Ext: no edema Psych: pleasant, normal affect Skin: intact GU:  +catheter, draining clear urine.  Skin: + Left PICC line, intact. Diffuse, flat erythematous rash along the entire back and bilateral buttocks.  Blanchable, without any apparent pustules          MSK:      No apparent deformity.  Mild swelling in right hand.       Neurologic exam:  Cognition: AAO to person, place, time and event.  Follows all simple commands, difficulty with higher-level cognitive tasks. Language: Fluent, No substitutions or neoglisms. No dysarthria. Names 3/3 objects correctly.  Memory: Recalls 0/3 objects at 5 minutes.  Insight: Good insight into current condition.  Mood: Pleasant affect, appropriate mood.  Flat sense of  humor. Sensation: To light touch intact in BL UEs and LEs  Reflexes: 2+ in BL UE and LEs. Negative Hoffman's and babinski signs bilaterally.  CN: 2-12 grossly intact.  Coordination: No apparent tremors. No ataxia on LUE FTN, HTS bilaterally.  Spasticity: MAS 0 in all extremities.  Mild flexor tone in right distal fingers. Strength:                RUE: 3+/5 SA, 4/5 EF, 4/5 EE, 2+/5 WE, 3/5 FF, 3/5 FA                 LUE: 5/5 SA, 5/5 EF, 5/5 EE, 5/5 WE, 5/5 FF, 5/5 FA                 RLE: 4/5 HF, 4/5 KE, 4/5 DF, 4/5 EHL, 4/5 PF                 LLE:  5/5 HF, 5/5 KE, 5/5 DF, 5/5 EHL, 5/5 PF, stable 12/9         Assessment/Plan: 1. Functional deficits which require 3+ hours per day of interdisciplinary therapy in a comprehensive inpatient rehab setting. Physiatrist is  providing close team supervision and 24 hour management of active medical problems listed below. Physiatrist and rehab team continue to assess barriers to discharge/monitor patient progress toward functional and medical goals  Care Tool:  Bathing    Body parts bathed by patient: Right arm, Left arm, Chest, Abdomen, Front perineal area, Face   Body parts bathed by helper: Buttocks, Right upper leg, Left upper leg, Right lower leg, Left lower leg     Bathing assist Assist Level: Moderate Assistance - Patient 50 - 74%     Upper Body Dressing/Undressing Upper body dressing   What is the patient wearing?: Pull over shirt    Upper body assist Assist Level: Moderate Assistance - Patient 50 - 74%    Lower Body Dressing/Undressing Lower body dressing      What is the patient wearing?: Incontinence brief, Pants     Lower body assist Assist for lower body dressing: Maximal Assistance - Patient 25 - 49%     Toileting Toileting    Toileting assist Assist for toileting: Maximal Assistance - Patient 25 - 49%     Transfers Chair/bed transfer  Transfers assist     Chair/bed transfer assist level: Moderate  Assistance - Patient 50 - 74%     Locomotion Ambulation   Ambulation assist      Assist level: Minimal Assistance - Patient > 75% Assistive device: Walker-rolling Max distance: 20   Walk 10 feet activity   Assist     Assist level: Minimal Assistance - Patient > 75% Assistive device: Walker-rolling   Walk 50 feet activity   Assist Walk 50 feet with 2 turns activity did not occur: Safety/medical concerns         Walk 150 feet activity   Assist Walk 150 feet activity did not occur: Safety/medical concerns         Walk 10 feet on uneven surface  activity   Assist Walk 10 feet on uneven surfaces activity did not occur: Safety/medical concerns         Wheelchair     Assist Is the patient using a wheelchair?: Yes Type of Wheelchair: Manual    Wheelchair assist level: Moderate Assistance - Patient 50 - 74% Max wheelchair distance: 50    Wheelchair 50 feet with 2 turns activity    Assist        Assist Level: Moderate Assistance - Patient 50 - 74%   Wheelchair 150 feet activity     Assist      Assist Level: Total Assistance - Patient < 25%   Blood pressure 139/77, pulse 93, temperature 97.9 F (36.6 C), temperature source Oral, resp. rate 18, height 6\' 1"  (1.854 m), weight 86.9 kg, SpO2 96%.  Medical Problem List and Plan: 1. 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  Vitamin D and B supplements started -Continue CIR  2.  Antithrombotics: -DVT/anticoagulation:  Pharmaceutical: Lovenox             -antiplatelet therapy: continue Aspirin and Plavix indefinitely (cox-2 inhibitor allergy but has tolerated aspirin in the past)   3. 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 Mood/Behavior/Sleep: LCSW to evaluate and provide emotional support             -antipsychotic agents: n/a  -Start trazodone 25 to 50 mg as needed for insomnia   5. Neuropsych/cognition: This patient is capable of making decisions on his own behalf.   6. Skin/Wound Care: Routine skin care checks   7. Fluids/Electrolytes/Nutrition: Routine Is and Os and follow-up chemistries             -dysphagia 3 diet/thin liquids             -SLP eval   8: Hypertension: monitor TID and prn (home hydrochlorothiazide 25 mg BID held)             -continue hydralazine 25 mg TID             -decrease lisinopril to 10mg   -magnesium gluconate 250mg  started HS  -12/9 blood pressure controlled, continue to monitor     08/14/2023    1:42 PM 08/14/2023    4:41 AM 08/13/2023    7:43 PM  Vitals with BMI  Systolic 139 137 086  Diastolic 77 69 70  Pulse 93 92 86      9: Hyperlipidemia: continue statin   10: Metabolic encephalopathy in the setting of sepsis, with possible cefepime neurotoxicity as a contributing factor: see #15   11: DM: CBGs QID, A1c = 7.3% (home regimen: glimepiride 4 mg q AM, Jardiance 10 mg daily)             -continue SSI             -continue Semglee 5 units q HS             -monitor intake and consider restarting home meds  -restart Jardiance  -12/9 Jardiance started yesterday, monitor response to medication change today and continue current regimen  CBG (last 3)  Recent Labs    08/13/23 2059 08/14/23 0620 08/14/23 1129  GLUCAP 189* 126* 188*      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  Diffuse urticarial rash.  Change hydrocortisone cream twice daily to Benadryl cream twice daily, add as needed Benadryl 25 mg every 6 hours for itching and sleep.  Discontinue Chlorhex baths  -Therapy reporting circular rash on abdomen, clotrimazole ordered for this area however discontinued after images seen as a suspect this is related to his urticarial rash, continue benedryl, Discussed with pharmacy  18. Urinary incontinence. PVRs Q6H, ISC for >350 ccs. Messaged nursing to discuss how his PVRs have been.   19.  History of restless leg syndrome.  Patient denies symptoms.  Continue to monitor for now    LOS: 3 days A FACE TO FACE EVALUATION WAS PERFORMED  Fanny Dance 08/14/2023, 4:47 PM

## 2023-08-14 NOTE — IPOC Note (Signed)
Overall Plan of Care [facility]) Patient Details Name: Jeremy Valencia MRN: [id number] DOB: July 22, 1941  Admitting Diagnosis: CVA (cerebral vascular accident) [facility])  Hospital Problems: Principal Problem:   CVA (cerebral vascular accident) [facility])     Functional Problem List: Nursing Bladder, Bowel, Safety, Endurance, Medication Management, Pain  PT Balance, Safety, Endurance, Motor  OT Balance, Endurance, Motor, Safety  SLP Cognition  TR         Basic ADL's: OT Grooming, Bathing, Dressing, Eating, Toileting     Advanced  ADL's: OT       Transfers: PT Bed Mobility, Bed to Chair, Car, Occupational psychologist, Research scientist (life sciences): PT Stairs, Psychologist, prison and probation services, Ambulation     Additional Impairments: OT Fuctional Use of Upper Extremity  SLP Swallowing, Social Cognition   Problem Solving, Memory, Awareness  TR      Anticipated Outcomes Item Anticipated Outcome  Self Feeding Mod i  Swallowing  modiA   Basic self-care  SBA  Toileting  SBA   Bathroom Transfers SBA  Bowel/Bladder  manage bowel w toileting and bladder w mod I assist  Transfers  supervision  Locomotion  supervision  Communication     Cognition  minA  Pain  manage with prns < 4  Safety/Judgment  manage w cues   Therapy Plan: PT Intensity: Minimum of 1-2 x/day ,45 to 90 minutes PT Frequency: 5 out of 7 days PT Duration Estimated Length of Stay: 12-14 days OT Intensity: Minimum of 1-2 x/day, 45 to 90 minutes OT Frequency: 5 out of 7 days OT Duration/Estimated Length of Stay: 12-14 days SLP Intensity: Minumum of 1-2 x/day, 30 to 90 minutes SLP Frequency: 3 to 5 out of 7 days SLP Duration/Estimated Length of Stay: 10-14 days   Team Interventions: Nursing Interventions Patient/Family Education, Disease Management/Prevention, Bladder Management, Bowel Management, Medication Management, Pain Management, Discharge Planning  PT interventions Ambulation/gait training, Community reintegration,  Neuromuscular re-education, Stair training, UE/LE Strength taining/ROM, Wheelchair propulsion/positioning, Therapeutic Activities, UE/LE Coordination activities, Discharge planning, Functional mobility training, Patient/family education, Therapeutic Exercise  OT Interventions Cognitive remediation/compensation, Warden/ranger, Community reintegration, Discharge planning, DME/adaptive equipment instruction, Functional electrical stimulation, Functional mobility training, Neuromuscular re-education, Patient/family education, Psychosocial support, Self Care/advanced ADL retraining, Splinting/orthotics, Therapeutic Activities, Therapeutic Exercise, UE/LE Strength taining/ROM, UE/LE Coordination activities, Wheelchair propulsion/positioning, Visual/perceptual remediation/compensation  SLP Interventions Cognitive remediation/compensation, Dysphagia/aspiration precaution training, Cueing hierarchy, Therapeutic Activities, Functional tasks, Patient/family education, Therapeutic Exercise  TR Interventions    SW/CM Interventions     Barriers to Discharge MD  Medical stability  Nursing Home environment access/layout, Decreased caregiver support 2 level 2 ste no rails w spouse  PT Lack of/limited family support spouse unable to care.  OT      SLP      SW       Team Discharge Planning: Destination: PT-Home ,OT- Home , SLP-Home Projected Follow-up: PT-Home health PT, OT-  Home health OT, SLP-Home Health SLP, 24 hour supervision/assistance, Outpatient SLP Projected Equipment Needs: PT-To be determined, OT- Tub/shower seat, 3 in 1 bedside comode, SLP-None recommended by SLP Equipment Details: PT- , OT-Pt owns a RW Patient/family involved in discharge planning: PT- Patient,  OT-Patient, SLP-Patient  MD ELOS: 12-14 Medical Rehab Prognosis:  Excellent Assessment: The patient has been admitted for CIR therapies with the diagnosis of left thalamic stroke, with additional infarct in right frontal  lobe, 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 The team will be addressing functional mobility, strength, stamina, balance, safety, adaptive techniques and equipment, self-care, bowel  and bladder mgt, patient and caregiver education. Goals have been set at min A/Sup. Anticipated discharge destination is home.        See Team Conference Notes for weekly updates to the plan of care

## 2023-08-14 NOTE — Progress Notes (Signed)
Orthopedic Tech Progress Note Patient Details:  Jeremy Valencia 07-Feb-1941 [id number] Called in wrist brace for pt per order.  Patient ID: Jeremy Valencia, male   DOB: May 13, 1941, 82 y.o.   MRN: [id number]  [name] 08/14/2023, 12:00 PM

## 2023-08-14 NOTE — Progress Notes (Signed)
Patient ID: TORRE CARULLI, male   DOB: [birth year], 82 y.o.   MRN: [id number] Met with the patient to review current medical status, risk factors, rehab schedule, team conference and plan of care. Reviewed medication for secondary risk management including DM (A1 C 7.3) on Semglee. Patient no opposed to insulin but reported oral agents PTA (Jardiance and glimepiride).  Reviewed DAPT indefinitely,  HLD (LDL 51/Trig 256) on Lipitor; patient denies high intake of bread/butter, creams etc.  Incontinent of bowel and bladder on D3 thin diet.  Note some word finding difficulties and delayed response with processing of request.  Noted not sleeping well in the hospital; took Trazodone prn PTA.  Reported taking med PTA for RLS; not needed at present time.  Unaware of need for IV abx; endocarditis infection.  Continue to follow along to address educational needs to facilitate preparation for discharge. Pamelia Hoit

## 2023-08-15 ENCOUNTER — Telehealth: Payer: Self-pay

## 2023-08-15 DIAGNOSIS — I6310 Cerebral infarction due to embolism of unspecified precerebral artery: Secondary | ICD-10-CM | POA: Diagnosis not present

## 2023-08-15 LAB — BASIC METABOLIC PANEL WITH GFR
Anion gap: 11 (ref 5–15)
BUN: 18 mg/dL (ref 8–23)
CO2: 24 mmol/L (ref 22–32)
Calcium: 8.7 mg/dL — ABNORMAL LOW (ref 8.9–10.3)
Chloride: 100 mmol/L (ref 98–111)
Creatinine, Ser: 1.15 mg/dL (ref 0.61–1.24)
GFR, Estimated: >60 mL/min
Glucose, Bld: 125 mg/dL — ABNORMAL HIGH (ref 70–99)
Potassium: 4.1 mmol/L (ref 3.5–5.1)
Sodium: 135 mmol/L (ref 135–145)

## 2023-08-15 LAB — GLUCOSE, CAPILLARY
Glucose-Capillary: 102 mg/dL — ABNORMAL HIGH (ref 70–99)
Glucose-Capillary: 146 mg/dL — ABNORMAL HIGH (ref 70–99)
Glucose-Capillary: 184 mg/dL — ABNORMAL HIGH (ref 70–99)
Glucose-Capillary: 138 mg/dL — ABNORMAL HIGH (ref 70–99)

## 2023-08-15 LAB — VANCOMYCIN, TROUGH: Vancomycin Tr: 20 ug/mL (ref 15–20)

## 2023-08-15 MED ORDER — CHLORHEXIDINE GLUCONATE CLOTH 2 % EX PADS
6.0000 | MEDICATED_PAD | Freq: Two times a day (BID) | CUTANEOUS | Status: DC
  Administered 2023-08-15 – 2023-08-24 (×18): 6 via TOPICAL

## 2023-08-15 MED ORDER — VANCOMYCIN HCL 750 MG/150ML IV SOLN
750.0000 mg | Freq: Two times a day (BID) | INTRAVENOUS | Status: DC
Start: 2023-08-16 — End: 2023-08-24
  Administered 2023-08-15 – 2023-08-23 (×17): 750 mg via INTRAVENOUS
  Filled 2023-08-15 (×19): qty 150

## 2023-08-15 MED ORDER — HYDROCORTISONE 1 % EX CREA
TOPICAL_CREAM | Freq: Two times a day (BID) | CUTANEOUS | Status: DC
  Administered 2023-08-16: 1 via TOPICAL
  Filled 2023-08-15: qty 28

## 2023-08-15 NOTE — Progress Notes (Signed)
PROGRESS NOTE   Subjective/Complaints: Appears to have drug rash, hydrocortisone ordered, will consult ID regarding potential alternative antibiotics Patient upset he received eggs and sausage instead of oatmeal  ROS: +focal weakness, insomnia Denies chest pain, shortness of breath, abdominal pain, nausea / vomiting, +pruritic rash  Objective:   No results found. Recent Labs    08/14/23 0300  WBC 15.1*  HGB 15.2  HCT 44.8  PLT 377   Recent Labs    08/14/23 0300 08/15/23 0928  NA 137 135  K 3.8 4.1  CL 103 100  CO2 25 24  GLUCOSE 134* 125*  BUN 17 18  CREATININE 1.05 1.15  CALCIUM 8.4* 8.7*    Intake/Output Summary (Last 24 hours) at 08/15/2023 1037 Last data filed at 08/14/2023 1240 Gross per 24 hour  Intake 240 ml  Output --  Net 240 ml        Physical Exam: Vital Signs Blood pressure 128/69, pulse 95, temperature 98.9 F (37.2 C), temperature source Oral, resp. rate 17, height 6\' 1"  (1.854 m), weight 86.9 kg, SpO2 94%. Gen: no distress, normal appearing, appears comfortable sitting in bed HEENT: oral mucosa pink and moist, NCAT Cardio: RRR Chest: CTAB, normal effort, normal rate of breathing Abd: soft, non-distended Ext: no edema Psych: pleasant, normal affect Skin: intact GU:  +catheter, draining clear urine.  Skin: + Left PICC line, intact. Diffuse, flat erythematous rash along the entire back and bilateral buttocks, as well as arms. Blanchable, without any apparent pustules          MSK:      No apparent deformity.  Mild swelling in right hand.       Neurologic exam:  Cognition: AAO to person, place, time and event.  Follows all simple commands, difficulty with higher-level cognitive tasks. Language: Fluent, No substitutions or neoglisms. No dysarthria. Names 3/3 objects correctly.  Memory: Recalls 0/3 objects at 5 minutes.  Insight: Good insight into current condition.  Mood:  Pleasant affect, appropriate mood.  Flat sense of humor. Sensation: To light touch intact in BL UEs and LEs  Reflexes: 2+ in BL UE and LEs. Negative Hoffman's and babinski signs bilaterally.  CN: 2-12 grossly intact.  Coordination: No apparent tremors. No ataxia on LUE FTN, HTS bilaterally.  Spasticity: MAS 0 in all extremities.  Mild flexor tone in right distal fingers. Strength:                RUE: 3+/5 SA, 4/5 EF, 4/5 EE, 2+/5 WE, 3/5 FF, 3/5 FA                 LUE: 5/5 SA, 5/5 EF, 5/5 EE, 5/5 WE, 5/5 FF, 5/5 FA                 RLE: 4/5 HF, 4/5 KE, 4/5 DF, 4/5 EHL, 4/5 PF                 LLE:  5/5 HF, 5/5 KE, 5/5 DF, 5/5 EHL, 5/5 PF, stable 12/9         Assessment/Plan: 1. Functional deficits which require 3+ hours per day of interdisciplinary therapy in a comprehensive inpatient rehab setting.  Physiatrist is providing close team supervision and 24 hour management of active medical problems listed below. Physiatrist and rehab team continue to assess barriers to discharge/monitor patient progress toward functional and medical goals  Care Tool:  Bathing    Body parts bathed by patient: Right arm, Left arm, Chest, Abdomen, Front perineal area, Face   Body parts bathed by helper: Buttocks, Right upper leg, Left upper leg, Right lower leg, Left lower leg     Bathing assist Assist Level: Moderate Assistance - Patient 50 - 74%     Upper Body Dressing/Undressing Upper body dressing   What is the patient wearing?: Pull over shirt    Upper body assist Assist Level: Moderate Assistance - Patient 50 - 74%    Lower Body Dressing/Undressing Lower body dressing      What is the patient wearing?: Incontinence brief, Pants     Lower body assist Assist for lower body dressing: Maximal Assistance - Patient 25 - 49%     Toileting Toileting    Toileting assist Assist for toileting: Maximal Assistance - Patient 25 - 49%     Transfers Chair/bed transfer  Transfers assist      Chair/bed transfer assist level: Moderate Assistance - Patient 50 - 74%     Locomotion Ambulation   Ambulation assist      Assist level: Minimal Assistance - Patient > 75% Assistive device: Walker-rolling Max distance: 20   Walk 10 feet activity   Assist     Assist level: Minimal Assistance - Patient > 75% Assistive device: Walker-rolling   Walk 50 feet activity   Assist Walk 50 feet with 2 turns activity did not occur: Safety/medical concerns         Walk 150 feet activity   Assist Walk 150 feet activity did not occur: Safety/medical concerns         Walk 10 feet on uneven surface  activity   Assist Walk 10 feet on uneven surfaces activity did not occur: Safety/medical concerns         Wheelchair     Assist Is the patient using a wheelchair?: Yes Type of Wheelchair: Manual    Wheelchair assist level: Moderate Assistance - Patient 50 - 74% Max wheelchair distance: 50    Wheelchair 50 feet with 2 turns activity    Assist        Assist Level: Moderate Assistance - Patient 50 - 74%   Wheelchair 150 feet activity     Assist      Assist Level: Total Assistance - Patient < 25%   Blood pressure 128/69, pulse 95, temperature 98.9 F (37.2 C), temperature source Oral, resp. rate 17, height 6\' 1"  (1.854 m), weight 86.9 kg, SpO2 94%.  Medical Problem List and Plan: 1. 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  Vitamin D and B supplements started -Continue CIR  2.  Antithrombotics: -DVT/anticoagulation:  Pharmaceutical: Lovenox             -antiplatelet therapy: continue Aspirin and Plavix indefinitely (cox-2 inhibitor allergy but has tolerated aspirin in the past)   3. 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 Mood/Behavior/Sleep: LCSW to evaluate and provide emotional support             -antipsychotic agents: n/a  -Start trazodone 25 to 50 mg as needed for insomnia   5. Neuropsych/cognition: This patient is capable of making decisions on his own behalf.   6. Skin/Wound Care: Routine skin care checks   7. Fluids/Electrolytes/Nutrition: Routine Is and Os and follow-up chemistries             -dysphagia 3 diet/thin liquids             -SLP eval   8: Hypertension: monitor TID and prn (home hydrochlorothiazide 25 mg BID held)             -continue hydralazine 25 mg TID             -decrease lisinopril to 10mg   -magnesium gluconate 250mg  started HS  -12/9 blood pressure controlled, continue to monitor     08/15/2023    4:50 AM 08/14/2023    7:38 PM 08/14/2023    1:42 PM  Vitals with BMI  Systolic 128 131 295  Diastolic 69 71 77  Pulse 95 85 93      9: Hyperlipidemia: continue statin   10: Metabolic encephalopathy in the setting of sepsis, with possible cefepime neurotoxicity as a contributing factor: see #15   11: DM: CBGs QID, A1c = 7.3% (home regimen: glimepiride 4 mg q AM, Jardiance 10 mg daily)             -continue SSI             -continue Semglee 5 units q HS             -monitor intake and consider restarting home meds  -restart Jardiance  -12/9 Jardiance started yesterday, monitor response to medication change today and continue current regimen  CBG (last 3)  Recent Labs    08/14/23 1654 08/14/23 2105 08/15/23 0603  GLUCAP 208* 132* 102*      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 Overweight: d/c Ensure  18. Urinary incontinence. PVRs d/ced since not retaining significant amounts  19.  History of restless leg syndrome.  Patient denies symptoms.  continue to monitor for now  20. Rash: concerning for drug rash, will consult ID regarding whether antibiotics can be changed, hydrocortisone ordered    LOS: 4 days A FACE TO FACE EVALUATION WAS PERFORMED  Drema Pry Jocelyn Nold 08/15/2023, 10:37 AM

## 2023-08-15 NOTE — Progress Notes (Signed)
Speech Language Pathology Daily Session Note  Patient Details  Name: [name] MRN: [id number] Date of Birth: 02/20/1941  Today's Date: 08/15/2023 SLP Individual Time: 0804-0900 SLP Individual Time Calculation (min): 56 min  Short Term Goals: Week 1: SLP Short Term Goal 1 (Week 1): Patient will utilize swallowing compensatory strategies during consumption of D3/thin diet given supervision multimodal A SLP Short Term Goal 2 (Week 1): Patient will demonstrate problem solving abilities in basic situations given mod multimodal A SLP Short Term Goal 3 (Week 1): Patient will recall and utilize memory compensatory strategies given mod multimodal A SLP Short Term Goal 4 (Week 1): Patient will recall two cognitive and two physical changes since admission given mod multimodal A  Skilled Therapeutic Interventions:  Patient was seen in am to address cognitive re- training and dysphagia management. Pt was alert and seen at bedside with NSG and NT present. Pt consumed medication whole in puree without difficulty. SLP assisted pt in repositioning pt upright. He was presented with breakfast meal consisting of Dys 3 solids and thin liquids. Pt requested assist with feeding allowing SLP to predetermine bolus size. Given small single boluses at a time, he presented with appeared adequate bolus formation, transit and deglutition with no s/s asp. There were also no instances of pocketing or significant residual. Pt requested discontinuation of meal after 3 bites due to dissatisfaction. SLP requested a preferred item from dietary services on pt's behalf. Pt recalled WRAP compensatory strategies discussed on previous date. He indep referred to handout SLP provided to recall. Pt subsequently challenged in paragraph retention. Given presentation of paragraph presented verbally, SLP guided pt in repetition and association strategies. Given a delay, he recalled information with 83% acc with min A. In other minutes of  session, SLP addressed basic problem solving through presentation of scenarios visually. Pt identified problems and solutions with 75% acc with min to mod cues. Greater difficulty observed with more complex concepts and manipulation of information. At conclusion of session, pt was left seated upright in bed with call button within reach and bed alarm active. SLP to continue POC.    Pain Pain Assessment Pain Scale: 0-10 Pain Score: 0-No pain  Therapy/Group: Individual Therapy  Renaee Munda 08/15/2023, 8:55 AM

## 2023-08-15 NOTE — Telephone Encounter (Signed)
Pt noted to have an erythematous rash on low back and buttock area on 08/08/23- At that time it was thought to be due to sweating, [place] pressure/allergy- On 11/27 He was started on vanco and ceftriaxone- Vanco was Dc after 1 dose on 11/28- He was started on Doxy on 11/28 and when I saw him on 12/3 , I changed doxy to vanco . That day the rash was already there- it looks like it progressed now to involve the trunk as per Dr.Raulkar- Likely offending drug is ceftriaxone . 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 Blood culture neg- karius test ( cell free DNA in plasma) was sent ( o look for any bacteria or fungus not covered by current antibiotic) a week after being on antibiotics and it was negative.  So was the work up for bartonella, Coxiella, Beta D glucan Discussed with Dr.Raulkar- will stop ceftriaxone, wait for 48 hrs to assess the rash and then decide on 2nd antibiotic for gram neg coverage. Also follow closely cr and vanco trough.

## 2023-08-15 NOTE — Progress Notes (Signed)
Inpatient Rehabilitation Care Coordinator Assessment and Plan Patient Details  Name: Jeremy Valencia MRN: [id number] Date of Birth: 08/28/1941  Today's Date: 08/15/2023  Hospital Problems: Principal Problem:   CVA (cerebral vascular accident) [facility])  Past Medical History:  Past Medical History:  Diagnosis Date   Arthritis    BPH (benign prostatic hyperplasia)    Bursitis 05/16/2013   BURSITIS/CAPSULITIS RT ANKLE   Cancer (HCC)    skin cancer   Capsulitis of right foot 01/21/2013   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    Diabetes mellitus without complication (HCC)    Diverticulosis    Emphysema, unspecified (HCC)    GERD (gastroesophageal reflux disease)    Hyperlipidemia    Hypertension    Plantar fasciitis    Past Surgical History:  Past Surgical History:  Procedure Laterality Date   APPENDECTOMY     BACK SURGERY     CATARACT EXTRACTION Left    COLONOSCOPY     COLONOSCOPY WITH PROPOFOL N/A 04/06/2021   Procedure: COLONOSCOPY WITH PROPOFOL;  Surgeon: Regis Bill, MD;  Location: [facility] ENDOSCOPY;  Service: Endoscopy;  Laterality: N/A;  DM   FOOT SURGERY     LAMINECTOMY     right eye     ROTATOR CUFF REPAIR Right    TEE WITHOUT CARDIOVERSION N/A 08/08/2023   Procedure: TRANSESOPHAGEAL ECHOCARDIOGRAM (TEE);  Surgeon: Antonieta Iba, MD;  Location: [facility] ORS;  Service: Cardiovascular;  Laterality: N/A;   UPPER GI ENDOSCOPY     Social History:  reports that he has quit smoking. He has never used smokeless tobacco. He reports current alcohol use. He reports that he does not use drugs.  Family / Support Systems Marital Status: Married How Long?: n/a Patient Roles: Spouse, Partner Spouse/Significant Other: June Ladona Ridgel Children: Beth (Daughter) Genelle Bal (son) Other Supports: n/a Anticipated Caregiver: wife and daughter Ability/Limitations of Caregiver: no limitations reported Caregiver Availability: 24/7 Family Dynamics: supportive family  Social  History Preferred language: English Religion: Catholic Cultural Background: n/a Education: HS Health Literacy - How often do you need to have someone help you when you read instructions, pamphlets, or other written material from your doctor or pharmacy?: Patient unable to respond Writes: Yes Employment Status: Retired Date Retired/Disabled/Unemployed: n/a Marine scientist Issues: n/a Guardian/Conservator: n/a   Abuse/Neglect Abuse/Neglect Assessment Can Be Completed: Unable to assess, patient is non-responsive or altered mental status Physical Abuse: Denies Verbal Abuse: Denies Sexual Abuse: Denies Exploitation of patient/patient's resources: Denies Self-Neglect: Denies  Patient response to: Social Isolation - How often do you feel lonely or isolated from those around you?: Patient unable to respond  Emotional Status Pt's affect, behavior and adjustment status: Pleasant Recent Psychosocial Issues: Coping Psychiatric History: n/a Substance Abuse History: n/a  Patient / Family Perceptions, Expectations & Goals Pt/Family understanding of illness & functional limitations: yes Premorbid pt/family roles/activities: Independent overall prior, retired and driving Anticipated changes in roles/activities/participation: Anticipating some assistance from spouse and daughter. Pt/family expectations/goals: [employer]: None Premorbid Home Care/DME Agencies: Other (Comment) (RW) Transportation available at discharge: family Is the patient able to respond to transportation needs?: No Resource referrals recommended: Neuropsychology  Discharge Planning Living Arrangements: Spouse/significant other Support Systems: Spouse/significant other, Children Type of Residence: Private residence (1-2 Steps to enter) Care Facility Name: n/a Insurance Resources: Media planner (specify) ([employer] Medicare) Financial Resources: Social Security, Family  Support Financial Screen Referred: No Living Expenses: Own Money Management: Patient, Spouse Does the patient have any problems obtaining your medications?:  No Home Management: Independent overall prior Patient/Family Preliminary Plans: Spouse able to assist with cogntive tasks. Care Coordinator Barriers to Discharge: Lack of/limited family support, Decreased caregiver support, Insurance for SNF coverage, Home environment access/layout Care Coordinator Anticipated Follow Up Needs: HH/OP Expected length of stay: 12-18 Days  Clinical Impression Sw met with patient introduced self and explained role.  Sw spoke with spouse, introduced self and explained role on 12/9.Pt discharging home with assistance from spouse and daughter. Spouse does not drive, pt was primary driver. Sw will contact spouse with updates tomorrow.  Patient discharging to a 1 level home with 1-2 steps to enter the home. Pt and spouse complete laundry in the basement level of the home.   Patient currently has a RW. No additional questions or concerns.   Andria Rhein 08/15/2023, 12:59 PM

## 2023-08-15 NOTE — Telephone Encounter (Signed)
Received a call from [place], [place] regarding the patient having a rash that is worsening.  Per Dois Davenport Dr. Dalene Carrow feels the vanc may be contributing to the patient's rash. They would like Dr. Lynne Logan advice on this.  Secure chat sent to Dr. Rivka Safer with all information and cell number for Dr. Dalene Carrow.  Letesha Klecker Jonathon Resides, CMA

## 2023-08-15 NOTE — Progress Notes (Signed)
Occupational Therapy Session Note  Patient Details  Name: Jeremy Valencia MRN: [id number] Date of Birth: [birth year]  Today's Date: 08/15/2023 OT Individual Time: 0905-1000  OT Individual Time Calculation (min): 55 min    Short Term Goals: Week 1:  OT Short Term Goal 1 (Week 1): Pt will complete UB  bathing and dressing with Min A and use of hemi dressing techniques, OT Short Term Goal 2 (Week 1): Pt will complete LB bathing and dressing with Mod A and use of AE and hemi dressing techniques. OT Short Term Goal 3 (Week 1): Pt will demonstrate improved right side awareness by turning their head and visually scanning to locate items placed on their right side in 3/6 trials with moderate verbal or tactile cueing, to support engagement in basic functional self care tasks. OT Short Term Goal 4 (Week 1): Pt will demonstrate improved toiletig skills while completing 3/3 steps with Max A.  Skilled Therapeutic Interventions/Progress Updates:  Skilled OT intervention completed with focus on ADL retraining, functional transfers. Pt received upright in bed, with MD present. Pt more receptive and agreeable to session this date with only redirective cues for engagement in therapy vs "going to the gym." Improved Rt wist pain with splint wear that was ordered yesterday, however continued itchiness on areas of rash reported. MD present and cleared pt for therapy and is aware of status. OT offered rest breaks and repositioning throughout for pain reduction.  With cues, pt doffed RUE resting hand splint with ++ time for locating velcro straps. No concerning areas of skin breakdown noted. Then OT donned universal/thumb spica splint for increased wrist stability and pain management in prep for mobility. PICC nurse present for blood draw. OT removed Rt hand orthotic off of RW due to brace providing sufficient stability and for greater functional grasp however did need initial hand over hand upon sit > stand for  maintaining hold of RW. Pt completed min A sit > stand with RW then CGA stand pivot > w/c. Transported to toilet, then min A sit > stand and stand pivot with grab bar > BSC over toilet. Total A needed for lowering brief. Pt incontinent of urinary void but further continent on commode. Pt threaded pull up with min A and pants with max A, then assisted over hips after min A sit > stand with grab bar. Min A stand pivot > w/c.  Seated at sink, pt completed facial shaving with electric razor with supervision. Required max cues for sequencing of hemi technique and attention to Rt side, but able to donn shirt with min A this date. Donned shoes max A. Pt needed assist to open containers on breakfast tray. NT notified of intermittent supervision need per feeding sign.  Pt remained seated in w/c, with belt alarm on/activated, and with all needs in reach at end of session.   Therapy Documentation Precautions:  Precautions Precautions: Fall Precaution Comments: R Hemi Restrictions Weight Bearing Restrictions: No    Therapy/Group: Individual Therapy  Melvyn Novas, MS, OTR/L  08/15/2023, 12:10 PM

## 2023-08-15 NOTE — Progress Notes (Signed)
Occupational Therapy Session Note  Patient Details  Name: Jeremy Valencia MRN: [id number] Date of Birth: 1941-01-18  Today's Date: 08/15/2023 OT Individual Time: 4098-1191 OT Individual Time Calculation (min): 36 min    Short Term Goals: Week 1:  OT Short Term Goal 1 (Week 1): Pt will complete UB  bathing and dressing with Min A and use of hemi dressing techniques, OT Short Term Goal 2 (Week 1): Pt will complete LB bathing and dressing with Mod A and use of AE and hemi dressing techniques. OT Short Term Goal 3 (Week 1): Pt will demonstrate improved right side awareness by turning their head and visually scanning to locate items placed on their right side in 3/6 trials with moderate verbal or tactile cueing, to support engagement in basic functional self care tasks. OT Short Term Goal 4 (Week 1): Pt will demonstrate improved toiletig skills while completing 3/3 steps with Max A.  Skilled Therapeutic Interventions/Progress Updates:     Pt received reclined in bed with DTR present in room. Pt presenting with flat affect and to be mildly upset upon OT arrival, however receptive to skilled OT session reporting 0/10 pain- OT offering intermittent rest breaks, repositioning, and therapeutic support to optimize participation in therapy session. Pt reporting that he is more tired at this time because he spent too long up in his chair today and his call bell was not working to request to return to bed. Provided therapeutic support and ensured Pt's call bell worked during session. Spent time building rapport with Pt and learning about d/c plans to support improved participation in therapy session. Pt transitioned to EOB using bed features with min A to lift trunk. Sitting EOB, engaged Pt in seated R hand block exercises using green (moderate resistance) block to increase strength and attention to R UE. Pt completed 1x10 reps of the following exercises with OT providing max verbal cues for technique and tactile  cues for muscle activation:  -Tip grip -Gross Grasp -Key Pinch -Three-finger Pinch  -Thumb flexion Pt then completed 1x10 reps of functional anterior reaching towards OT's hand and maintained grasp while bringing hand to mouth with mod verbal and tactile cues required to attend to R UE.  Pt requesting to return to bed at end of session. Engaged Pt in completing side steps to EOB using RW to support improved positioning in bed. Pt required min A for balance and mod verbal cues for RW management. EOB>supine light mod A to bring B LEs into bed. Pt able to use B LEs to push self towards [facility] with min A to guide hips. Pt was left resting in bed with call bell in reach, bed alarm on, and all needs met.    Therapy Documentation Precautions:  Precautions Precautions: Fall Precaution Comments: R Hemi Restrictions Weight Bearing Restrictions: No   Therapy/Group: Individual Therapy  [name] 08/15/2023, 3:46 PM

## 2023-08-15 NOTE — Progress Notes (Addendum)
Pharmacy Antibiotic Note  Jeremy Valencia is a 82 y.o. male admitted to [facility] 08/11/2023 , transferred from [facility]. Pharmacy has been consulted for vancomycin dosing for culture negative endocarditis. 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  Per OPAT 12/6, antibiotic end date 09/12/23.  Checking frequent Vancomycin troughs due to concern for possible accumulation. Vanc trough today is 20 mcg/ml, upper limit of target range, after dose decreased from 1250 mg to 1gm IV q12h on 12/8 when trough level was 23 mcg/ml. - Creatinine has trended up some. Urine output reported adequate and with no issues. - Lisinopril dose reduced from 20 to 10 mg daily on 12/9. - Ceftriaxone discontinued as possibly causing/contributing to rash.  ID to reassess in 48hrs.  Plan: Decrease Vancomycin from 1gm to 750 mg IV q12h. Vanc trough goal 15-20 mcg/ml. Bmet on Mondays and Thursdays, to be sure twice weekly on Vanc; recheck in am for trend. Vancomycin troughs at least weekly while on Vanc, but will recheck 12/12 with bmet. Ceftriaxone discontinued. Will follow up status of rash and ID input for 2nd antibiotic.  Height: 6\' 1"  (185.4 cm) Weight: 86.9 kg (191 lb 9.3 oz) IBW/kg (Calculated) : 79.9  Temp (24hrs), Avg:99 F (37.2 C), Min:98.9 F (37.2 C), Max:99 F (37.2 C)  Recent Labs  Lab 08/09/23 0354 08/10/23 0543 08/11/23 0526 08/11/23 0526 08/11/23 1310 08/13/23 0501 08/14/23 0300 08/15/23 0928  WBC 10.1  --   --   --  9.8  --  15.1*  --   CREATININE 0.83 0.83 0.76  --  0.82  --  1.05 1.15  VANCOTROUGH  --   --  17   < >  --  23*  --  20   < > = values in this interval not displayed.    Estimated Creatinine Clearance: 54 mL/min (by C-G formula based on SCr of 1.15 mg/dL).    Allergies  Allergen Reactions   Metformin  Diarrhea   Rofecoxib Other (See Comments)    Other reaction(s): Unknown   Lamisil [Terbinafine] Rash    Antimicrobials this admission: Cefepime 11/27 >> 11/28 Metronidazole 11/27 >> 11/28 Doxycycline 11/28 >> 12/03 Ceftriaxone 11/28 >> 11/30; 12/02 >> 12/10 Vancomycin 11/27 >> 11/28; 12/04 >> (09/12/23)  Dose adjustments this admission: 12/06 VT 17 on vanc 1250 q12 > continue and recheck VT in ~48h 12/08 VT 23 on vanc 1250 q12h > decr to 1gm q12h  12/10 VT 20 on vanc 1 gm q12h > decr to 750 mg q12h  Microbiology results: 11/26 blood: negative 12/1 blood: negative 12/4 Bartonella: negative 12/4 Coxiella: negative 12/4 Karius test: negative 12/4 beta d glucan: negative  Thank you for allowing pharmacy to be a part of this patient's care.  Dennie Fetters, [place] 08/15/2023 2:37 PM

## 2023-08-15 NOTE — Progress Notes (Signed)
Physical Therapy Session Note  Patient Details  Name: Jeremy Valencia MRN: [id number] Date of Birth: 06/26/1941  Today's Date: 08/15/2023 PT Individual Time: 1100-1138 PT Individual Time Calculation (min): 38 min  Today's Date: 08/15/2023 PT Missed Time: 22 Minutes Missed Time Reason: Patient fatigue  Short Term Goals: Week 1:  PT Short Term Goal 1 (Week 1): Pt will transfer sup to sit w/ CGA consistently. PT Short Term Goal 2 (Week 1): Pt will transfer sit to stand w/ Min A PT Short Term Goal 3 (Week 1): Pt will amb w/ CGA x 50'  Skilled Therapeutic Interventions/Progress Updates:   Received pt sitting in WC with RUE hanging off armrest and pt unaware. Pt upset about being "left in the chair" and call bell not working. Therapist plugged in call bell and tested it to be sure it worked. Encouraged going to gym for therapy however pt refused and insisted on returning to bed to rest stating "no, not today". Offered seated exercises or change of scenery out of room but pt continued to refuse and politely demanded to return to bed. Stood from [facility] with RW and min A (assist for RUE management) and transferred to bed via stand<>pivot with RW and min A. Removed shoes and inspected rash on back and transferred into supine with supervision.   Provided pt with HEP and educated on frequency/duration/technique for the following exercises: - Supine Bridge  - 1 x daily - 7 x weekly - 3 sets - 10 reps - Clamshell  - 1 x daily - 7 x weekly - 3 sets - 10 reps - Supine Active Straight Leg Raise  - 1 x daily - 7 x weekly - 3 sets - 10 reps - Seated March  - 1 x daily - 7 x weekly - 3 sets - 10 reps - Seated Long Arc Quad  - 1 x daily - 7 x weekly - 3 sets - 10 reps - Seated Hip Abduction with Resistance  - 1 x daily - 7 x weekly - 3 sets - 10 reps - Seated Heel Raise  - 1 x daily - 7 x weekly - 3 sets - 10 reps - Seated Heel Toe Raises  - 1 x daily - 7 x weekly - 3 sets - 10 reps Concluded session with pt  semi-reclined in bed, needs within reach, and bed alarm on. Pt asleep before therapist left room. 22 minutes missed of skilled physical therapy due to fatigue.   Therapy Documentation Precautions:  Precautions Precautions: Fall Precaution Comments: R Hemi Restrictions Weight Bearing Restrictions: No  Therapy/Group: Individual Therapy Marlana Salvage Zaunegger Blima Rich PT, DPT 08/15/2023, 7:08 AM

## 2023-08-15 NOTE — Progress Notes (Signed)
Placed call to ID department regarding on ongoing skin rash and concern for drug reaction/vancomycin contributing factor.

## 2023-08-16 ENCOUNTER — Inpatient Hospital Stay (HOSPITAL_COMMUNITY): Payer: Medicare HMO

## 2023-08-16 DIAGNOSIS — I6310 Cerebral infarction due to embolism of unspecified precerebral artery: Secondary | ICD-10-CM | POA: Diagnosis not present

## 2023-08-16 LAB — BASIC METABOLIC PANEL
Anion gap: 8 (ref 5–15)
BUN: 21 mg/dL (ref 8–23)
CO2: 24 mmol/L (ref 22–32)
Calcium: 8.3 mg/dL — ABNORMAL LOW (ref 8.9–10.3)
Chloride: 103 mmol/L (ref 98–111)
Creatinine, Ser: 1.03 mg/dL (ref 0.61–1.24)
GFR, Estimated: >60 mL/min (ref >60)
Glucose, Bld: 97 mg/dL (ref 70–99)
Potassium: 3.6 mmol/L (ref 3.5–5.1)
Sodium: 135 mmol/L (ref 135–145)

## 2023-08-16 LAB — GLUCOSE, CAPILLARY
Glucose-Capillary: 95 mg/dL (ref 70–99)
Glucose-Capillary: 144 mg/dL — ABNORMAL HIGH (ref 70–99)
Glucose-Capillary: 125 mg/dL — ABNORMAL HIGH (ref 70–99)

## 2023-08-16 MED ORDER — HYDRALAZINE HCL 10 MG PO TABS
10.0000 mg | ORAL_TABLET | Freq: Three times a day (TID) | ORAL | Status: DC
  Administered 2023-08-16 – 2023-08-24 (×23): 10 mg via ORAL
  Filled 2023-08-16 (×24): qty 1

## 2023-08-16 NOTE — Progress Notes (Signed)
Patient ID: Jeremy Valencia, male   DOB: [birth year], 82 y.o.   MRN: [id number]  Team Conference Report to Patient/Family  Team Conference discussion was reviewed with the patient and caregiver, including goals, any changes in plan of care and target discharge date.  Patient and caregiver express understanding and are in agreement.  The patient has a target discharge date of 08/23/23.  Sw spoke with pt's spouse and Daughter, Jeremy Valencia to discuss team conference updates. Patient to discharge home with IV ABX, sw informed family. SW will send referral to [facility].  Spouse, daughter and potentially pt's son will attend family education on Friday from 1-4.   SW discussed the reason for arranging HH vs. OP. OP to be arranged once patient has completed IV ABX. No additional questions or concerns.  Andria Rhein 08/16/2023, 2:57 PM

## 2023-08-16 NOTE — Progress Notes (Signed)
PROGRESS NOTE   Subjective/Complaints: Continues to have rash, discussed that it may take three days for it to resolve Team conference held today Right wrist swelling present  ROS: +focal weakness, insomnia Denies chest pain, shortness of breath, abdominal pain, nausea / vomiting, +pruritic rash, +right rist swelling  Objective:   No results found. Recent Labs    08/14/23 0300  WBC 15.1*  HGB 15.2  HCT 44.8  PLT 377   Recent Labs    08/15/23 0928 08/16/23 0317  NA 135 135  K 4.1 3.6  CL 100 103  CO2 24 24  GLUCOSE 125* 97  BUN 18 21  CREATININE 1.15 1.03  CALCIUM 8.7* 8.3*    Intake/Output Summary (Last 24 hours) at 08/16/2023 1639 Last data filed at 08/16/2023 0711 Gross per 24 hour  Intake 360 ml  Output --  Net 360 ml        Physical Exam: Vital Signs Blood pressure (!) 117/54, pulse (!) 101, temperature 98.2 F (36.8 C), temperature source Oral, resp. rate 16, height 6\' 1"  (1.854 m), weight 86.9 kg, SpO2 93%. Gen: no distress, normal appearing, appears comfortable sitting in bed HEENT: oral mucosa pink and moist, NCAT Cardio: Tachycardic Chest: CTAB, normal effort, normal rate of breathing Abd: soft, non-distended Ext: no edema Psych: pleasant, normal affect Skin: intact GU:  +catheter, draining clear urine.  Skin: + Left PICC line, intact. Diffuse, flat erythematous rash along the entire back and bilateral buttocks, as well as arms. Blanchable, without any apparent pustules          MSK:      No apparent deformity.  Mild swelling in right hand.       Neurologic exam:  Cognition: AAO to person, place, time and event.  Follows all simple commands, difficulty with higher-level cognitive tasks. Language: Fluent, No substitutions or neoglisms. No dysarthria. Names 3/3 objects correctly.  Memory: Recalls 0/3 objects at 5 minutes.  Insight: Good insight into current condition.  Mood:  Pleasant affect, appropriate mood.  Flat sense of humor. Sensation: To light touch intact in BL UEs and LEs  Reflexes: 2+ in BL UE and LEs. Negative Hoffman's and babinski signs bilaterally.  CN: 2-12 grossly intact.  Coordination: No apparent tremors. No ataxia on LUE FTN, HTS bilaterally.  Spasticity: MAS 0 in all extremities.  Mild flexor tone in right distal fingers. Strength:                RUE: 3+/5 SA, 4/5 EF, 4/5 EE, 2+/5 WE, 3/5 FF, 3/5 FA                 LUE: 5/5 SA, 5/5 EF, 5/5 EE, 5/5 WE, 5/5 FF, 5/5 FA                 RLE: 4/5 HF, 4/5 KE, 4/5 DF, 4/5 EHL, 4/5 PF                 LLE:  5/5 HF, 5/5 KE, 5/5 DF, 5/5 EHL, 5/5 PF, stable 12/9         Assessment/Plan: 1. Functional deficits which require 3+ hours per day of interdisciplinary therapy in a  comprehensive inpatient rehab setting. Physiatrist is providing close team supervision and 24 hour management of active medical problems listed below. Physiatrist and rehab team continue to assess barriers to discharge/monitor patient progress toward functional and medical goals  Care Tool:  Bathing    Body parts bathed by patient: Right arm, Left arm, Chest, Abdomen, Front perineal area, Face   Body parts bathed by helper: Buttocks, Right upper leg, Left upper leg, Right lower leg, Left lower leg     Bathing assist Assist Level: Moderate Assistance - Patient 50 - 74%     Upper Body Dressing/Undressing Upper body dressing   What is the patient wearing?: Pull over shirt    Upper body assist Assist Level: Moderate Assistance - Patient 50 - 74%    Lower Body Dressing/Undressing Lower body dressing      What is the patient wearing?: Incontinence brief, Pants     Lower body assist Assist for lower body dressing: Maximal Assistance - Patient 25 - 49%     Toileting Toileting    Toileting assist Assist for toileting: Maximal Assistance - Patient 25 - 49%     Transfers Chair/bed transfer  Transfers assist      Chair/bed transfer assist level: Minimal Assistance - Patient > 75%     Locomotion Ambulation   Ambulation assist      Assist level: Minimal Assistance - Patient > 75% Assistive device: Walker-rolling Max distance: 147ft   Walk 10 feet activity   Assist     Assist level: Minimal Assistance - Patient > 75% Assistive device: Walker-rolling   Walk 50 feet activity   Assist Walk 50 feet with 2 turns activity did not occur: Safety/medical concerns  Assist level: Minimal Assistance - Patient > 75% Assistive device: Walker-rolling    Walk 150 feet activity   Assist Walk 150 feet activity did not occur: Safety/medical concerns  Assist level: Minimal Assistance - Patient > 75% Assistive device: Walker-rolling    Walk 10 feet on uneven surface  activity   Assist Walk 10 feet on uneven surfaces activity did not occur: Safety/medical concerns         Wheelchair     Assist Is the patient using a wheelchair?: Yes Type of Wheelchair: Manual    Wheelchair assist level: Moderate Assistance - Patient 50 - 74% Max wheelchair distance: 50    Wheelchair 50 feet with 2 turns activity    Assist        Assist Level: Moderate Assistance - Patient 50 - 74%   Wheelchair 150 feet activity     Assist      Assist Level: Total Assistance - Patient < 25%   Blood pressure (!) 117/54, pulse (!) 101, temperature 98.2 F (36.8 C), temperature source Oral, resp. rate 16, height 6\' 1"  (1.854 m), weight 86.9 kg, SpO2 93%.  Medical Problem List and Plan: 1. 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  Vitamin D and B supplements started -Continue CIR  2.  Antithrombotics: -DVT/anticoagulation:  Pharmaceutical:  Lovenox             -antiplatelet therapy:  continue Aspirin and Plavix indefinitely (cox-2 inhibitor allergy but has tolerated aspirin in the past)   3. 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 Mood/Behavior/Sleep: LCSW to evaluate and provide emotional support             -antipsychotic agents: n/a  -Start trazodone 25 to 50 mg as needed for insomnia   5. Neuropsych/cognition: This patient is capable of making decisions on his own behalf.   6. Skin/Wound Care: Routine skin care checks   7. Fluids/Electrolytes/Nutrition: Routine Is and Os and follow-up chemistries             -dysphagia 3 diet/thin liquids             -SLP eval   8: Hypertension: monitor TID and prn (home hydrochlorothiazide 25 mg BID held)             -continue hydralazine 25 mg TID             -decrease lisinopril to 10mg   -magnesium gluconate 250mg  started HS  -12/9 blood pressure controlled, continue to monitor     08/16/2023    1:23 PM 08/16/2023    3:37 AM 08/15/2023    7:25 PM  Vitals with BMI  Systolic 117 116 623  Diastolic 54 66 78  Pulse 101 88 88      9: Hyperlipidemia: continue statin   10: Metabolic encephalopathy in the setting of sepsis, with possible cefepime neurotoxicity as a contributing factor: see #15   11: DM: CBGs QID, A1c = 7.3% (home regimen: glimepiride 4 mg q AM, Jardiance 10 mg daily)             -continue SSI             -continue Semglee 5 units q HS             -monitor intake and consider restarting home meds  -restart Jardiance  -12/9 Jardiance started yesterday, monitor response to medication change today and continue current regimen  CBG (last 3)  Recent Labs    08/15/23 1632 08/15/23 2037 08/16/23 0629  GLUCAP 184* 138* 95      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 Overweight: d/c Ensure  18. Urinary incontinence. PVRs d/ced since not retaining significant amounts  19.  History of restless leg syndrome.  Patient denies symptoms.  continue to monitor for now  20. Rash: concerning for drug rash, will consult ID regarding whether antibiotics can be changed, hydrocortisone ordered, ceftriaxone d/ced as per ID recommendations  21. Hypotension: decrease hydralazine to 10mg  TID  22. Right rist swelling: XR ordered    LOS: 5 days A FACE TO FACE EVALUATION WAS PERFORMED  Horton Chin 08/16/2023, 4:39 PM

## 2023-08-16 NOTE — Discharge Instructions (Addendum)
Inpatient Rehab Discharge Instructions  Jeremy Valencia Discharge date and time:  08/24/2023  Activities/Precautions/ Functional Status: Activity: no lifting, driving, or strenuous exercise until cleared by MD Diet: diabetic diet Wound Care: none needed Functional status:  ___ No restrictions     ___ Walk up steps independently _x__ 24/7 supervision/assistance   ___ Walk up steps with assistance ___ Intermittent supervision/assistance  ___ Bathe/dress independently ___ Walk with walker     ___ Bathe/dress with assistance ___ Walk Independently    ___ Shower independently ___ Walk with assistance    ___ Shower with assistance _x__ No alcohol     ___ Return to work/school ________  Special Instructions: No driving, alcohol consumption or tobacco use.  STROKE/TIA DISCHARGE INSTRUCTIONS SMOKING Cigarette smoking nearly doubles your risk of having a stroke & is the single most alterable risk factor  If you smoke or have smoked in the last 12 months, you are advised to quit smoking for your health. Most of the excess cardiovascular risk related to smoking disappears within a year of stopping. Ask you doctor about anti-smoking medications [place] Quit Line: 1-800-QUIT NOW Free Smoking Cessation Classes [phone]  CHOLESTEROL Know your levels; limit fat & cholesterol in your diet  Lipid Panel  No results found for: "CHOL", "TRIG", "HDL", "CHOLHDL", "VLDL", "LDLCALC"   Many patients benefit from treatment even if their cholesterol is at goal. Goal: Total Cholesterol (CHOL) less than 160 Goal:  Triglycerides (TRIG) less than 150 Goal:  HDL greater than 40 Goal:  LDL (LDLCALC) less than 100   BLOOD PRESSURE American Stroke Association blood pressure target is less that 120/80 mm/Hg  Your discharge blood pressure is:  BP: 129/73 Monitor your blood pressure Limit your salt and alcohol intake Many individuals will require more than one medication for high blood pressure  DIABETES (A1c is a  blood sugar average for last 3 months) Goal HGBA1c is under 7% (HBGA1c is blood sugar average for last 3 months)  Diabetes: Diagnosis of diabetes:  Your A1c: 7.3 %    Lab Results  Component Value Date   HGBA1C 7.3 (H) 08/01/2023    Your HGBA1c can be lowered with medications, healthy diet, and exercise. Check your blood sugar as directed by your physician Call your physician if you experience unexplained or low blood sugars.  PHYSICAL ACTIVITY/REHABILITATION Goal is 30 minutes at least 4 days per week  Activity: Increase activity slowly, Therapies: Physical Therapy: Home Health, Occupational Therapy: Home Health, and Speech Therapy: Home Health Return to work: n/a Activity decreases your risk of heart attack and stroke and makes your heart stronger.  It helps control your weight and blood pressure; helps you relax and can improve your mood. Participate in a regular exercise program. Talk with your doctor about the best form of exercise for you (dancing, walking, swimming, cycling).  DIET/WEIGHT Goal is to maintain a healthy weight  Your discharge diet is:  Diet Order             DIET DYS 3 Fluid consistency: Thin  Diet effective now                  thin liquids Your height is:  Height: 6\' 1"  (185.4 cm) Your current weight is: Weight: 86.9 kg Your Body Mass Index (BMI) is:  BMI (Calculated): 25.28 Following the type of diet specifically designed for you will help prevent another stroke. Your goal weight range is:   Your goal Body Mass Index (BMI) is 19-24.  Healthy food habits can help reduce 3 risk factors for stroke:  High cholesterol, hypertension, and excess weight.  RESOURCES Stroke/Support Group:  Call [phone]   STROKE EDUCATION PROVIDED/REVIEWED AND GIVEN TO PATIENT Stroke warning signs and symptoms How to activate emergency medical system (call 911). Medications prescribed at discharge. Need for follow-up after discharge. Personal risk factors for  stroke. Pneumonia vaccine given: No Flu vaccine given: No My questions have been answered, the writing is legible, and I understand these instructions.  I will adhere to these goals & educational materials that have been provided to me after my discharge from the hospital.     My questions have been answered and I understand these instructions. I will adhere to these goals and the provided educational materials after my discharge from the hospital.  Patient/Caregiver Signature _______________________________ Date __________  Clinician Signature _______________________________________ Date __________  Please bring this form and your medication list with you to all your follow-up doctor's appointments.

## 2023-08-16 NOTE — Progress Notes (Signed)
Physical Therapy Session Note  Patient Details  Name: Jeremy Valencia MRN: [id number] Date of Birth: 07/26/1941  Today's Date: 08/16/2023 PT Individual Time: 1324-4010 and 2725-3664 PT Individual Time Calculation (min): 54 min and 30 min PT Missed Time: 45 minutes PT Missed Time Reason: fatigue, unwillingness to participate, x-ray  Short Term Goals: Week 1:  PT Short Term Goal 1 (Week 1): Pt will transfer sup to sit w/ CGA consistently. PT Short Term Goal 2 (Week 1): Pt will transfer sit to stand w/ Min A PT Short Term Goal 3 (Week 1): Pt will amb w/ CGA x 50'  Skilled Therapeutic Interventions/Progress Updates:   Treatment Session 1 Received pt semi-reclined in bed asleep. Upon wakening, pt required maximal encouragement to participate in session and reported fatigue but denied any pain. Session with emphasis on functional mobility/transfers, generalized strengthening and endurance, dynamic standing balance/coordination, and gait training. Pt transferred semi-reclined<>sitting L EOB with HOB slightly elevated with supervision. Donned shoes with max A and pt performed all transfers with RW and min A with assist to manage RUE. Pt transported to/from room in [facility] dependently for time management and energy conservation purposes. Stood with RW and min A and ambulated 16ft with RW and min A with intermittent assist for RUE management (pt does not like hand orthosis).   Ambulated to/from mat (85ft x 2 trials) with RW and CGA and performed standing toe taps to 3in step with BUE support on RW 2x10 bilaterally with CGA for balance. Pt stated "ok, that's it, let's go back" and insisted on returning to room due to fatigue. Returned to room and pt ambulated 25ft with RW and CGA to bed, removed shoes, and inspected rash on back (appears to be healing well). Pt transferred into supine with supervision and concluded session with pt semi-reclined in bed, needs within reach, and bed alarm on.   Treatment Session  2 Received pt supine in bed with RN exiting room just finishing getting pt comfortable in bed. Attempted to encourage OOB mobility but pt very resistive stating "no, I'm not going" and putting hand up to therapist. Pt reports having a "bad day" and feeling "depressed" due to rash on his back and getting x-ray of wrist. With encouragement, pt agreed to bed level exercises and performed the following exercises with emphasis on LE strength/ROM: -SLR 2x12 bilaterally -hip abduction 2x12 bilaterally -hip adduction pillow squeezes 2x15 -bridges 2x10 -ankle pumps 2x20 bilaterally Pt rushing through exercises and not following cues for slow controlled movements, therefore ended exercises. Discussed missed therapy time and changing schedule to 15/7 due to poor participation and pt in agreement - MD notified. X-ray arrived and pt left semi-reclined in bed, needs within reach, and bed alarm on. 45 minutes missed of skilled physical therapy due to fatigue, unwillingness to participate, and x-ray arrival.   Therapy Documentation Precautions:  Precautions Precautions: Fall Precaution Comments: R Hemi Restrictions Weight Bearing Restrictions: No  Therapy/Group: Individual Therapy Marlana Salvage Zaunegger Blima Rich PT, DPT 08/16/2023, 7:03 AM

## 2023-08-16 NOTE — Progress Notes (Signed)
Physical Therapy Note  Patient Details  Name: KEVAN SIBBALD MRN: [id number] Date of Birth: 03-18-1941 Today's Date: 08/16/2023   Pt's plan of care adjusted to 15/7 after speaking with care team and discussed with MD in team conference as pt currently unable to tolerate current therapy schedule with OT, PT, and SLP.     Marlana Salvage Zaunegger Blima Rich PT, DPT 08/16/2023, 1:56 PM
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
## 2023-08-16 NOTE — Progress Notes (Signed)
Occupational Therapy Session Note  Patient Details  Name: Jeremy Valencia MRN: [id number] Date of Birth: 1941/08/16  Today's Date: 08/16/2023 OT Individual Time: 4098-1191 OT Individual Time Calculation (min): 70 min    Short Term Goals: Week 1:  OT Short Term Goal 1 (Week 1): Pt will complete UB  bathing and dressing with Min A and use of hemi dressing techniques, OT Short Term Goal 2 (Week 1): Pt will complete LB bathing and dressing with Mod A and use of AE and hemi dressing techniques. OT Short Term Goal 3 (Week 1): Pt will demonstrate improved right side awareness by turning their head and visually scanning to locate items placed on their right side in 3/6 trials with moderate verbal or tactile cueing, to support engagement in basic functional self care tasks. OT Short Term Goal 4 (Week 1): Pt will demonstrate improved toiletig skills while completing 3/3 steps with Max A.  Skilled Therapeutic Interventions/Progress Updates:     Pt received semi-reclined in bed presenting to be in good spirits receptive to skilled OT session reporting 0/10 pain- OT offering intermittent rest breaks, repositioning, and therapeutic support to optimize participation in therapy session. Pt politely declining need for shower this AM. Focus this session R UE NMRE, BADL retraining, and activity tolerance.   Pt transitioned to EOB light min A using bed features +increased time. Pt with clean shirt and brief on, however without pants. Re-educated Pt on hemi-technique for LB dressing with demonstration provided. Worked on alternate techniques of crossing legs into figure-four position vs reaching towards ground with Pt most successful this session when leaning anteriorly to weave B LEs- assistance required to fully weave feet d/t his toes getting caught on pants d/t decreased plantar flexion and awareness deficits. Pt stood using RW with CGA to bring pants to waist with assistance required to fully bring pants over R  hip.    Pt completed short distance functional mobility to wc ~86ft using RW with CGA provided for balance and mod verbal cues for positioning prior to sit. Positioned Pt at sink for BADLs with toiletry items positioned on R side of sink to facilitate visual scanning to R and increased R side attention. Min verbal cues required to locate 5/5 items. Education provided on hemi-techniques for grooming/hygiene tasks with Pt receptive to education, however requesting assistance instead d/t decreased motivation to complete tasks independently. Pt required mod verbal cues to utilized R UE as a stabilizer for items and to provide self-HOH A when washing face. Pt completed oral care, groomed hair, and washed face with mod A overall.   Transported Pt total A to therapy gym in wc for time management.   Engaged Pt  in R UE NMRE exercises seated at table top with R hand positioned on wash-cloth to decrease friction and allow Pt to focus on moving through functional movement patterns. Pt completed 1x10 reps of the following exercises with mod tactile and verbal cues required for technique, muscle engagement, and attention.  -Scapular protraction/retraction -Shoulder circumduction clockwise/counterclockwise  -Elbow flexion/extension -Maintained grasp with wrist extension -Digit flexion/extension -Pronation/supination Pt then completed functional movement patterns moving through D1 PNF pattern to bring hand to mouth and anterior functional reaching. Pt able to move through full ROM with min A provided at elbow to support improved motor control. Pt noted to have decreased wrist extension and VMC deficits during reaching tasks.    Pt requesting to use BR at end of session. Transported Pt back to room total A in  wc for energy conservation. Pt completed functional mobility to BR using RW with CGA and mod verbal cues for safety awareness to avoid running his RW into doorframe and toilet. Pt completed 3/3 toileting tasks  with max A, however Pt participating in clothing management with assistance required for adjusting pants over R hip and to ensure cleanliness when completing posterior peri-care.   Pt completed functional mobility to bed using RW CGA and returned to bed with mod A to ring B LEs into pants. Pt was left resting in bed with call bell in reach, bed alarm on, and all needs met.    Therapy Documentation Precautions:  Precautions Precautions: Fall Precaution Comments: R Hemi Restrictions Weight Bearing Restrictions: No   Therapy/Group: Individual Therapy  [name] 08/16/2023, 7:56 AM

## 2023-08-17 DIAGNOSIS — I6310 Cerebral infarction due to embolism of unspecified precerebral artery: Secondary | ICD-10-CM | POA: Diagnosis not present

## 2023-08-17 LAB — GLUCOSE, CAPILLARY
Glucose-Capillary: 109 mg/dL — ABNORMAL HIGH (ref 70–99)
Glucose-Capillary: 125 mg/dL — ABNORMAL HIGH (ref 70–99)
Glucose-Capillary: 172 mg/dL — ABNORMAL HIGH (ref 70–99)
Glucose-Capillary: 136 mg/dL — ABNORMAL HIGH (ref 70–99)

## 2023-08-17 LAB — VANCOMYCIN, TROUGH: Vancomycin Tr: 17 ug/mL (ref 15–20)

## 2023-08-17 LAB — BASIC METABOLIC PANEL
Anion gap: 11 (ref 5–15)
BUN: 18 mg/dL (ref 8–23)
CO2: 25 mmol/L (ref 22–32)
Calcium: 8.6 mg/dL — ABNORMAL LOW (ref 8.9–10.3)
Chloride: 102 mmol/L (ref 98–111)
Creatinine, Ser: 0.91 mg/dL (ref 0.61–1.24)
GFR, Estimated: >60 mL/min (ref >60)
Glucose, Bld: 144 mg/dL — ABNORMAL HIGH (ref 70–99)
Potassium: 3.9 mmol/L (ref 3.5–5.1)
Sodium: 138 mmol/L (ref 135–145)

## 2023-08-17 NOTE — Plan of Care (Signed)
  Problem: Consults Goal: RH STROKE PATIENT EDUCATION Description: See Patient Education module for education specifics  Outcome: Progressing   Problem: RH BOWEL ELIMINATION Goal: RH STG MANAGE BOWEL WITH ASSISTANCE Description: STG Manage Bowel with mod I Assistance. Outcome: Progressing Goal: RH STG MANAGE BOWEL W/MEDICATION W/ASSISTANCE Description: STG Manage Bowel with Medication with mod I Assistance. Outcome: Progressing   Problem: RH BLADDER ELIMINATION Goal: RH STG MANAGE BLADDER WITH ASSISTANCE Description: STG Manage Bladder With mod I Assistance Outcome: Progressing Goal: RH STG MANAGE BLADDER WITH MEDICATION WITH ASSISTANCE Description: STG Manage Bladder With Medication With mod I Assistance. Outcome: Progressing   Problem: RH SAFETY Goal: RH STG ADHERE TO SAFETY PRECAUTIONS W/ASSISTANCE/DEVICE Description: STG Adhere to Safety Precautions With cues Assistance/Device. Outcome: Progressing   Problem: RH PAIN MANAGEMENT Goal: RH STG PAIN MANAGED AT OR BELOW PT'S PAIN GOAL Description: < 4 with prns Outcome: Progressing   Problem: RH KNOWLEDGE DEFICIT Goal: RH STG INCREASE KNOWLEDGE OF DIABETES Description: Patient and spouse will be able to manage DM with medications and dietary modifications using educational resources independently Outcome: Progressing Goal: RH STG INCREASE KNOWLEDGE OF HYPERTENSION Description: Patient and spouse will be able to manage HTN with medications and dietary modifications using educational resources independently Outcome: Progressing Goal: RH STG INCREASE KNOWLEDGE OF DYSPHAGIA/FLUID INTAKE Description: Patient and spouse will be able to manage Dysphagia, medications and dietary modifications using educational resources independently Outcome: Progressing Goal: RH STG INCREASE KNOWLEGDE OF HYPERLIPIDEMIA Description: Patient and spouse will be able to manage HLD with medications and dietary modifications using educational resources  independently Outcome: Progressing Goal: RH STG INCREASE KNOWLEDGE OF 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

## 2023-08-17 NOTE — Progress Notes (Signed)
PROGRESS NOTE   Subjective/Complaints: Rash is better but still present Continues to have pain and swelling in right hand, discussed XR results, he continues to wear brace  ROS: +focal weakness, insomnia Denies chest pain, shortness of breath, abdominal pain, nausea / vomiting, +pruritic rash, +right wrist swelling and pain  Objective:   DG Wrist 2 Views Right Result Date: 08/16/2023 CLINICAL DATA:  Pain after physical therapy. EXAM: RIGHT WRIST - 2 VIEW COMPARISON:  X-ray 08/04/2023 FINDINGS: Advanced degenerative changes of the first carpometacarpal joint with the joint space loss, hypertrophic changes and sclerosis. Similar to previous. No acute fracture or dislocation. Preserved bone mineralization. Diffuse chondrocalcinosis and vascular calcifications. IMPRESSION: Degenerative changes particularly of the first carpometacarpal joint. Chondrocalcinosis. Electronically Signed   By: Karen Kays M.D.   On: 08/16/2023 17:43   No results for input(s): "WBC", "HGB", "HCT", "PLT" in the last 72 hours.  Recent Labs    08/16/23 0317 08/17/23 1117  NA 135 138  K 3.6 3.9  CL 103 102  CO2 24 25  GLUCOSE 97 144*  BUN 21 18  CREATININE 1.03 0.91  CALCIUM 8.3* 8.6*    Intake/Output Summary (Last 24 hours) at 08/17/2023 1327 Last data filed at 08/17/2023 1254 Gross per 24 hour  Intake 660 ml  Output --  Net 660 ml        Physical Exam: Vital Signs Blood pressure 134/80, pulse 93, temperature 97.7 F (36.5 C), temperature source Oral, resp. rate 17, height 6\' 1"  (1.854 m), weight 86.9 kg, SpO2 93%. Gen: no distress, normal appearing, appears comfortable sitting in bed HEENT: oral mucosa pink and moist, NCAT Cardio: Tachycardic Chest: CTAB, normal effort, normal rate of breathing Abd: soft, non-distended Ext: no edema Psych: pleasant, normal affect Skin: intact GU:  +catheter, draining clear urine.  Skin: + Left PICC  line, intact. Diffuse, flat erythematous rash along the entire back and bilateral buttocks, as well as arms. Blanchable, without any apparent pustules          MSK:      No apparent deformity.  Mild swelling in right hand.       Neurologic exam:  Cognition: AAO to person, place, time and event.  Follows all simple commands, difficulty with higher-level cognitive tasks. Language: Fluent, No substitutions or neoglisms. No dysarthria. Names 3/3 objects correctly.  Memory: Recalls 0/3 objects at 5 minutes.  Insight: Good insight into current condition.  Mood: Pleasant affect, appropriate mood.  Flat sense of humor. Sensation: To light touch intact in BL UEs and LEs  Reflexes: 2+ in BL UE and LEs. Negative Hoffman's and babinski signs bilaterally.  CN: 2-12 grossly intact.  Coordination: No apparent tremors. No ataxia on LUE FTN, HTS bilaterally.  Spasticity: MAS 0 in all extremities.  Mild flexor tone in right distal fingers. Strength:                RUE: 3+/5 SA, 4/5 EF, 4/5 EE, 2+/5 WE, 3/5 FF, 3/5 FA                 LUE: 5/5 SA, 5/5 EF, 5/5 EE, 5/5 WE, 5/5 FF, 5/5 FA  RLE: 4/5 HF, 4/5 KE, 4/5 DF, 4/5 EHL, 4/5 PF                 LLE:  5/5 HF, 5/5 KE, 5/5 DF, 5/5 EHL, 5/5 PF, stable 12/12         Assessment/Plan: 1. Functional deficits which require 3+ hours per day of interdisciplinary therapy in a comprehensive inpatient rehab setting. Physiatrist is providing close team supervision and 24 hour management of active medical problems listed below. Physiatrist and rehab team continue to assess barriers to discharge/monitor patient progress toward functional and medical goals  Care Tool:  Bathing    Body parts bathed by patient: Right arm, Left arm, Chest, Abdomen, Front perineal area, Face   Body parts bathed by helper: Buttocks, Right upper leg, Left upper leg, Right lower leg, Left lower leg     Bathing assist Assist Level: Moderate Assistance - Patient 50  - 74%     Upper Body Dressing/Undressing Upper body dressing   What is the patient wearing?: Pull over shirt    Upper body assist Assist Level: Moderate Assistance - Patient 50 - 74%    Lower Body Dressing/Undressing Lower body dressing      What is the patient wearing?: Incontinence brief, Pants     Lower body assist Assist for lower body dressing: Maximal Assistance - Patient 25 - 49%     Toileting Toileting    Toileting assist Assist for toileting: Maximal Assistance - Patient 25 - 49%     Transfers Chair/bed transfer  Transfers assist     Chair/bed transfer assist level: Minimal Assistance - Patient > 75%     Locomotion Ambulation   Ambulation assist      Assist level: Minimal Assistance - Patient > 75% Assistive device: Walker-rolling Max distance: 113ft   Walk 10 feet activity   Assist     Assist level: Minimal Assistance - Patient > 75% Assistive device: Walker-rolling   Walk 50 feet activity   Assist Walk 50 feet with 2 turns activity did not occur: Safety/medical concerns  Assist level: Minimal Assistance - Patient > 75% Assistive device: Walker-rolling    Walk 150 feet activity   Assist Walk 150 feet activity did not occur: Safety/medical concerns  Assist level: Minimal Assistance - Patient > 75% Assistive device: Walker-rolling    Walk 10 feet on uneven surface  activity   Assist Walk 10 feet on uneven surfaces activity did not occur: Safety/medical concerns         Wheelchair     Assist Is the patient using a wheelchair?: Yes Type of Wheelchair: Manual    Wheelchair assist level: Moderate Assistance - Patient 50 - 74% Max wheelchair distance: 50    Wheelchair 50 feet with 2 turns activity    Assist        Assist Level: Moderate Assistance - Patient 50 - 74%   Wheelchair 150 feet activity     Assist      Assist Level: Total Assistance - Patient < 25%   Blood pressure 134/80, pulse 93,  temperature 97.7 F (36.5 C), temperature source Oral, resp. rate 17, height 6\' 1"  (1.854 m), weight 86.9 kg, SpO2 93%.  Medical Problem List and Plan: 1. 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  Vitamin D and B supplements started -Continue CIR  2.  Antithrombotics: -DVT/anticoagulation:  Pharmaceutical: Lovenox             -antiplatelet therapy: continue Aspirin and Plavix indefinitely (cox-2 inhibitor allergy but has tolerated aspirin in the past)   3. 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 Mood/Behavior/Sleep: LCSW to evaluate and provide emotional support             -antipsychotic agents: n/a  -Start trazodone 25 to 50 mg as needed for insomnia   5. Neuropsych/cognition: This patient is capable of making decisions on his own behalf.   6. Skin/Wound Care: Routine skin care checks   7. Fluids/Electrolytes/Nutrition: Routine Is and Os and follow-up chemistries             -dysphagia 3 diet/thin liquids             -SLP eval   8: Hypertension: monitor TID and prn (home hydrochlorothiazide 25 mg BID held)             -continue hydralazine 25 mg TID             -decrease lisinopril to 10mg   -magnesium gluconate 250mg  started HS  -12/9 blood pressure controlled, continue to monitor     08/17/2023    3:33 AM 08/16/2023    7:54 PM 08/16/2023    1:23 PM  Vitals with BMI  Systolic 134 123 595  Diastolic 80 68 54  Pulse 93 85 101      9: Hyperlipidemia: continue statin   10: Metabolic encephalopathy in the setting of sepsis, with possible cefepime neurotoxicity as a contributing factor: see #15   11: DM: CBGs QID, A1c = 7.3% (home regimen:  glimepiride 4 mg q AM, Jardiance 10 mg daily)             -continue SSI             -continue Semglee 5 units q HS             -monitor intake and consider restarting home meds  -restart Jardiance  -12/9 Jardiance started yesterday, monitor response to medication change today and continue current regimen  CBG (last 3)  Recent Labs    08/16/23 2143 08/17/23 0543 08/17/23 1138  GLUCAP 125* 109* 125*      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 Incentive spirometer ordered   13: Glaucoma: continue Timoptic; has prosthetic right eye   14: Gout/no acute flare: home on colchicine 0.6 mg daily as needed   15: Mitral valve vegetation/sepsis: Continue Vancomycin 1250 mg BID for total of 6 weeks of therapy  (end date 09/12/2023)   16: Mildly elevated Bun: ensure adequate PO liquids and check BMP weekly   17. Overweight: d/c Ensure  18. Urinary incontinence. PVRs d/ced since not retaining significant amounts  19.  History of restless leg syndrome.  Patient denies symptoms.  continue to monitor for now  20. Rash: concerning for drug rash, will consult ID regarding whether antibiotics can be changed, hydrocortisone ordered, ceftriaxone d/ced as per ID recommendations, rash assessed and improved  21. Hypotension: decrease hydralazine to 10mg  TID, continue this dose  22. Right wrist swelling: XR ordered, discussed findings of degeneration    LOS: 6 days A FACE TO FACE EVALUATION WAS PERFORMED  Jeremy Valencia P Delise Simenson 08/17/2023, 1:27 PM

## 2023-08-17 NOTE — Progress Notes (Signed)
Speech Language Pathology Daily Session Note  Patient Details  Name: Jeremy Valencia MRN: [id number] Date of Birth: 12-Mar-1941  Today's Date: 08/17/2023 SLP Individual Time: 0907-1000 SLP Individual Time Calculation (min): 53 min  Short Term Goals: Week 1: SLP Short Term Goal 1 (Week 1): Patient will utilize swallowing compensatory strategies during consumption of D3/thin diet given supervision multimodal A SLP Short Term Goal 2 (Week 1): Patient will demonstrate problem solving abilities in basic situations given mod multimodal A SLP Short Term Goal 3 (Week 1): Patient will recall and utilize memory compensatory strategies given mod multimodal A SLP Short Term Goal 4 (Week 1): Patient will recall two cognitive and two physical changes since admission given mod multimodal A  Skilled Therapeutic Interventions: SLP conducted skilled therapy session targeting cognitive retraining goals. Patient agreeable to speech therapy session but perseverative on keeping environment organized, often interrupting therapist throughout session to rearrange room items per patient's preference. SLP and patient discussed deficits from stroke with patient requiring max assist to identify specific physical and cognitive deficits. Patient stating "I can't do what I used to" with supervision, but unable to specify without assistance. SLP guided patient through various basic problem solving tasks with patient requiring max multimodal assistance and repetition of prompt frequently throughout to promote recall of expectations. Verbal and functional problem solving both targeted with similar outcomes. Patient was left in lowered bed with call bell in reach and bed alarm set. SLP will continue to target goals per plan of care.       Pain Pain Assessment Pain Scale: 0-10 Pain Score: 0-No pain Multiple Pain Sites: No  Therapy/Group: Individual Therapy  Jeannie Done, M.A., CCC-SLP  [name] 08/17/2023, 9:27 AM

## 2023-08-17 NOTE — Progress Notes (Signed)
Physical Therapy Session Note  Patient Details  Name: Jeremy Valencia MRN: [id number] Date of Birth: 10/02/1940  Today's Date: 08/17/2023 PT Individual Time: 4034-7425 PT Individual Time Calculation (min): 42 min   Short Term Goals: Week 1:  PT Short Term Goal 1 (Week 1): Pt will transfer sup to sit w/ CGA consistently. PT Short Term Goal 2 (Week 1): Pt will transfer sit to stand w/ Min A PT Short Term Goal 3 (Week 1): Pt will amb w/ CGA x 50'  Skilled Therapeutic Interventions/Progress Updates:   Received pt semi-reclined in bed with daughter and wife at bedside. Pt agreeable to PT treatment and denied any pain during session - pt much more willing to participate when family present and encouraging. Session with emphasis on functional mobility/transfers, generalized strengthening and endurance, dynamic standing balance/coordination, and gait training.   Pt transferred semi-reclined<>sitting L EOB with HOB elevated and supervision using bedrails. Educated pt on importance of OOB mobility and staying upright throughout the day instead of lying in bed. Pt performed all transfers with RW and min A throughout session. Pt ambulated 148ft x 2 trials with RW and CGA/min A to/from dayroom. Pt performed seated BUE/BLE strengthening on Nustep at workload 4 for 5 minutes for a total of 229 steps with emphasis on cardiovascular endurance, RUE grip strength, and coordination with reciprocal movement. Worked on blocked practice sit<>stands 2x8 without UE support and CGA with emphasis on quad strength and decreasing reliance on AD - pt limited by fatigue stating "I'm done" and required extensive seated rest break. Returned to room and concluded session with pt sitting in WC, needs within reach, and seatbelt alarm on. Requested grounds pass from PA and RN at bedside attending to care.   Therapy Documentation Precautions:  Precautions Precautions: Fall Precaution Comments: R Hemi Restrictions Weight Bearing  Restrictions: No  Therapy/Group: Individual Therapy Marlana Salvage Zaunegger Blima Rich PT, DPT 08/17/2023, 7:05 AM

## 2023-08-17 NOTE — Progress Notes (Signed)
Patient ID: Jeremy Valencia, male   DOB: December 02, 1940, 82 y.o.   MRN: [id number]   RW with extension ordered through [employer].

## 2023-08-17 NOTE — Progress Notes (Signed)
Patient ID: Jeremy Valencia, male   DOB: [birth year], 82 y.o.   MRN: [id number]  Sw received call from  son, Jeremy Valencia. Son coming in for education tomorrow around 12PM. Son, spouse and daughter plan to take measurements of the pt's home tonight to bring in for education tomorrow.   Pt son is concerned that pt's doorways may be too small for the RW. Son also believes that patient current bed may be too high for him.   SW will allow family to attend education and have therapist FU. No additional questions or concerns.

## 2023-08-17 NOTE — Progress Notes (Signed)
Pharmacy Antibiotic Note  Jeremy Valencia is a 82 y.o. male admitted to [facility] 08/11/2023 , transferred from [facility]. Pharmacy has been consulted for vancomycin dosing for culture negative endocarditis. 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  Per OPAT 12/6, antibiotic end date 09/12/23.  Checking frequent Vancomycin troughs due to concern for possible accumulation. Vanc trough today is 17 mcg/ml, in target range, after dose decreased from 1250 mg to 750mg  IV q12h on 12/11 when trough level was 20 mcg/ml. - Creatinine has trended down now. Urine output reported adequate and with no issues. - Lisinopril dose reduced from 20 to 10 mg daily on 12/9. - Ceftriaxone discontinued as possibly causing/contributing to rash.  ID to reassess in 48hrs.  Plan: Conintue Vancomycin 750 mg IV q12h. Vanc trough goal 15-20 mcg/ml. Bmet on Mondays and Thursdays, to be sure twice weekly on Vanc; recheck Mon for trend. Vancomycin troughs at least weekly while on Vanc, but will recheck 12/19 with bmet. Ceftriaxone discontinued. Will follow up status of rash and ID input for 2nd antibiotic.  Height: 6\' 1"  (185.4 cm) Weight: 86.9 kg (191 lb 9.3 oz) IBW/kg (Calculated) : 79.9  Temp (24hrs), Avg:97.8 F (36.6 C), Min:97.6 F (36.4 C), Max:98.2 F (36.8 C)  Recent Labs  Lab 08/11/23 1310 08/13/23 0501 08/14/23 0300 08/15/23 0928 08/16/23 0317 08/17/23 1117  WBC 9.8  --  15.1*  --   --   --   CREATININE 0.82  --  1.05 1.15 1.03 0.91  VANCOTROUGH  --    < >  --  20  --  17   < > = values in this interval not displayed.    Estimated Creatinine Clearance: 68.3 mL/min (by C-G formula based on SCr of 0.91 mg/dL).    Allergies  Allergen Reactions   Metformin Diarrhea   Rofecoxib Other (See Comments)    Other reaction(s): Unknown    Lamisil [Terbinafine] Rash    Antimicrobials this admission: Cefepime 11/27 >> 11/28 Metronidazole 11/27 >> 11/28 Doxycycline 11/28 >> 12/03 Ceftriaxone 11/28 >> 11/30; 12/02 >> 12/10 Vancomycin 11/27 >> 11/28; 12/04 >> (09/12/23)  Dose adjustments this admission: 12/06 VT 17 on vanc 1250 q12 > continue and recheck VT in ~48h 12/08 VT 23 on vanc 1250 q12h > decr to 1gm q12h  12/10 VT 20 on vanc 1 gm q12h > decr to 750 mg q12h 12/12 VT 17 on vanc 750mg  IV q12h>>continue same  Microbiology results: 11/26 blood: negative 12/1 blood: negative 12/4 Bartonella: negative 12/4 Coxiella: negative 12/4 Karius test: negative 12/4 beta d glucan: negative  Jeremy Valencia A. Jeanella Craze, PharmD, BCPS, FNKF Clinical Pharmacist [place] Please utilize Amion for appropriate phone number to reach the unit pharmacist [facility])  08/17/2023 12:53 PM

## 2023-08-17 NOTE — Progress Notes (Signed)
Occupational Therapy Session Note  Patient Details  Name: KEAON SCHLOUGH MRN: [id number] Date of Birth: 11/15/1940  Today's Date: 08/17/2023 OT Individual Time: 1437-1530 OT Individual Time Calculation (min): 53 min    Short Term Goals: Week 1:  OT Short Term Goal 1 (Week 1): Pt will complete UB  bathing and dressing with Min A and use of hemi dressing techniques, OT Short Term Goal 2 (Week 1): Pt will complete LB bathing and dressing with Mod A and use of AE and hemi dressing techniques. OT Short Term Goal 3 (Week 1): Pt will demonstrate improved right side awareness by turning their head and visually scanning to locate items placed on their right side in 3/6 trials with moderate verbal or tactile cueing, to support engagement in basic functional self care tasks. OT Short Term Goal 4 (Week 1): Pt will demonstrate improved toiletig skills while completing 3/3 steps with Max A.  Skilled Therapeutic Interventions/Progress Updates:     Pt received reclined in bed with DTR and wife present in room presenting to be in good spirits receptive to skilled OT session reporting 0/10 pain- OT offering intermittent rest breaks, repositioning, and therapeutic support to optimize participation in therapy session. Focus this session d/c planning, BADL retraining, and functional mobility training. Focused beginning of session on engaging Pt and family members in d/c planning focused conversation in preparation for family education tomorrow. Discussed plans for supervision at d/c with Pt's wife reporting she will be providing 24/7 supervision and Pt's DTR can intermittently assist with IADLs. Pt lives in single level home with walk-in shower with education provided on using shower seat and grab bars to increase safety. Requested for family to take measurements of areas in home with home measurement sheet issued during previous session and to take pictures of living areas to optimize simulating the home environment  with Pt's family receptive to plan. Pt then requesting to brush teeth. Transitioned to EOB min A using bed features +increased time. Engaged Pt in completing functional mobility to wc ~10 ft using RW with CGA provided for safety. Positioned Pt as sink for oral care with items placed on R side to facilitate visual scanning with Pt able to locate 3/3 items with min questioning cues. Pt recalled hemi-technique and completed oral care with supervision +increased time. Transported Pt total A in wc for energy conservation. Engaged Pt in dual tasking dynamic balance visual scanning activity using RW to work on Engineer, building services, RW management, visual scanning, and functional reaching with R UE. Pt instructed to ambulate around therapy gym using RW to locate colored disks and then utilize his R UE to retrieve the disks. Pt required CGA-min A overall for balance and RW management, mod verbal cues for visual scanning, and min HOH A during functional reaching the shoulder height.Pt tolerate standing ~8 minutes during task with seated rest break provided following. Transported Pt back to room total A in wc for energy conservation. Stand pivot wc > EOB using RW CGA. EOB > supine mod A. Pt was left resting in bed with call bell in reach, bed alarm on, and all needs met.    Therapy Documentation Precautions:  Precautions Precautions: Fall Precaution Comments: R Hemi Restrictions Weight Bearing Restrictions Per Provider Order: No   Therapy/Group: Individual Therapy  [name] 08/17/2023, 4:43 PM

## 2023-08-17 NOTE — Plan of Care (Signed)
Patient alert/oriented X4. Patient compliant with medication administration and tolerated IV abx. Patient was up in chair for majority of shift. VSS, no complaints at this time.   Problem: Consults Goal: RH STROKE PATIENT EDUCATION Description: See Patient Education module for education specifics  Outcome: Progressing   Problem: RH BOWEL ELIMINATION Goal: RH STG MANAGE BOWEL WITH ASSISTANCE Description: STG Manage Bowel with mod I Assistance. Outcome: Progressing   Problem: RH BOWEL ELIMINATION Goal: RH STG MANAGE BOWEL W/MEDICATION W/ASSISTANCE Description: STG Manage Bowel with Medication with mod I Assistance. Outcome: Progressing   Problem: RH BLADDER ELIMINATION Goal: RH STG MANAGE BLADDER WITH ASSISTANCE Description: STG Manage Bladder With mod I Assistance Outcome: Progressing   Problem: RH BLADDER ELIMINATION Goal: RH STG MANAGE BLADDER WITH MEDICATION WITH ASSISTANCE Description: STG Manage Bladder With Medication With mod I Assistance. Outcome: Progressing   Problem: RH SAFETY Goal: RH STG ADHERE TO SAFETY PRECAUTIONS W/ASSISTANCE/DEVICE Description: STG Adhere to Safety Precautions With cues Assistance/Device. Outcome: Progressing   Problem: RH PAIN MANAGEMENT Goal: RH STG PAIN MANAGED AT OR BELOW PT'S PAIN GOAL Description: < 4 with prns Outcome: Progressing   Problem: RH KNOWLEDGE DEFICIT Goal: RH STG INCREASE KNOWLEDGE OF DIABETES Description: Patient and spouse will be able to manage DM with medications and dietary modifications using educational resources independently Outcome: Progressing   Problem: RH KNOWLEDGE DEFICIT Goal: RH STG INCREASE KNOWLEDGE OF HYPERTENSION Description: Patient and spouse will be able to manage HTN with medications and dietary modifications using educational resources independently Outcome: Progressing   Problem: RH KNOWLEDGE DEFICIT Goal: RH STG INCREASE KNOWLEDGE OF DYSPHAGIA/FLUID INTAKE Description: Patient and spouse  will be able to manage Dysphagia, medications and dietary modifications using educational resources independently Outcome: Progressing   Problem: RH KNOWLEDGE DEFICIT Goal: RH STG INCREASE KNOWLEGDE OF HYPERLIPIDEMIA Description: Patient and spouse will be able to manage HLD with medications and dietary modifications using educational resources independently Outcome: Progressing   Problem: RH KNOWLEDGE DEFICIT Goal: RH STG INCREASE KNOWLEDGE OF 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

## 2023-08-17 NOTE — Progress Notes (Signed)
Occupational Therapy Session Note  Patient Details  Name: Jeremy Valencia MRN: [id number] Date of Birth: Feb 27, 1941  Today's Date: 08/17/2023 OT Individual Time: 1133-1203 OT Individual Time Calculation (min): 30 min  and Today's Date: 08/17/2023 OT Missed Time: 15 Minutes Missed Time Reason: Nursing care   Short Term Goals: Week 1:  OT Short Term Goal 1 (Week 1): Pt will complete UB  bathing and dressing with Min A and use of hemi dressing techniques, OT Short Term Goal 2 (Week 1): Pt will complete LB bathing and dressing with Mod A and use of AE and hemi dressing techniques. OT Short Term Goal 3 (Week 1): Pt will demonstrate improved right side awareness by turning their head and visually scanning to locate items placed on their right side in 3/6 trials with moderate verbal or tactile cueing, to support engagement in basic functional self care tasks. OT Short Term Goal 4 (Week 1): Pt will demonstrate improved toiletig skills while completing 3/3 steps with Max A.  Skilled Therapeutic Interventions/Progress Updates:     Missed 15 minutes of skilled OT treatment d/t Pt receiving nursing care for [facility] line management. Pt received reclined in bed wit RN present in room providing care. Pt presenting to be in good spirits receptive to skilled OT session reporting 0/10 pain- OT offering intermittent rest breaks, repositioning, and therapeutic support to optimize participation in therapy session. Focus this session BADL retraining, R side attention, and functional mobility training to increase Pt's overall independence and decrease bourdon of care. Pt transitioned to EOB with HOB elevated with mod A to bring B LEs to EOB and fully lift trunk +increased time. Pt's brief noted to be soiled and Pt also requesting to use restroom during session. Sit > stand from elevated EOB using RW with R saddle splint min A. Engaged Pt in completing functional mobility to BR with light min A required for RW management and  mod verbal cues for R side attention and safety awareness. Pt able to doff pants in standing CGA using RW. Increased time provided on toilet d/t need for BM- small BM & void documented in flowsheets. Attempted to engage Pt in posterior peri-care in standing position with CGA provided for balance and R UE positioned on RW, however assistance required for pericare to ensure cleanliness. Pt doffed shirt and pants seated with min A. Re-educated Pt on hemi-dressing techniques to support improved learning and carryover. Pt required min A to donn shirt and max A to weave B LEs into pants and adjust pants over R hip. Pt requesting increased assistance with task requiring mod verbal cues for motivation. Pt fatigued following toileting and requesting to return to bed. Pt completed functional mobility back to his room using RW with min A required for balance and RW management and mod verbal cues for safety awareness to environment. EOB>supine mod A to bring B LEs into bed and mod verbal cues for technique. Pt was left resting in bed with call bell in reach, bed alarm on, and all needs met.    Therapy Documentation Precautions:  Precautions Precautions: Fall Precaution Comments: R Hemi Restrictions Weight Bearing Restrictions Per Provider Order: No   Therapy/Group: Individual Therapy  [name] 08/17/2023, 11:27 AM

## 2023-08-17 NOTE — Patient Care Conference (Signed)
Inpatient RehabilitationTeam Conference and Plan of Care Update Date: 08/16/2023   Time: 11:42 AM    Patient Name: Jeremy Valencia      Medical Record Number: [id number]  Date of Birth: 11/19/1940 Sex: Male         Room/Bed: 4W25C/4W25C-01 Payor Info: Payor: [employer] MEDICARE / Plan: [employer] MEDICARE HMO / Product Type: *No Product type* /    Admit Date/Time:  08/11/2023 12:06 PM  Primary Diagnosis:  CVA (cerebral vascular accident) [facility])  Hospital Problems: Principal Problem:   CVA (cerebral vascular accident) [facility]    Expected Discharge Date: Expected Discharge Date: 08/23/23  Team Members Present: Physician leading conference: Dr. Sula Soda Social Worker Present: Lavera Guise, BSW Nurse Present: Chana Bode, RN PT Present: Blima Rich, PT OT Present: Candee Furbish, OT SLP Present: Jeannie Done, SLP PPS Coordinator present : Fae Pippin, SLP     Current Status/Progress Goal Weekly Team Focus  Bowel/Bladder   Patient is incontinent x2. Last BM 12/9.   Toilet patient q3-4 hours while awake.   Monitor for changes in bowel and bladder function.    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   Pain less than 2.   Assess for pain q shift prn. Medicate prn.    Skin   Generalized rashes. Hydrocortisone cream as scheduled.   No skin breakdown. Turn q2 while awake.  Assess skin q shift and prn.      Discharge Planning:  Discharging home with assist from spouse and daughter. 2 steps to enter. Patient has a RW.   Team Discussion: Patient post CVA with delayed initiation, poor participation with therapy/lack of motivation, apraxia, right inattention, weakness and decreased awareness, memory and sequencing deficits with mild dysphagia.  Patient on target to meet rehab goals: yes, currently needs min assist for transfers, and steps. Able to ambulate up to 180' using a RW with min assist.  Needs mod assist for upper body care and max assist for lower body care with mod assist for toileting. Needs min- mod assist for cognition.  *See Care Plan and progress notes for long and short-term goals.   Revisions to Treatment Plan:  N/a   Teaching Needs: Safety, medications, transfers, toileting, etc.   Current Barriers to Discharge: Decreased caregiver support and Home enviroment access/layout  Possible Resolutions to Barriers: Family education DME: RW     Medical Summary Current Status: overweight, drug rash, endocarditis, type 2 diabetes, HTN, CVA  Barriers to Discharge: Medical stability  Barriers to Discharge Comments: overweight, drug rash, endocarditis, type 2 diabetes, HTN, CVA Possible Resolutions  to Barriers/Weekly Focus: provide dietary education, d/c ceftriaxone, consulted ID, continue to monitor vanc trough, continue insulin, continue lisinopril, continue aspirin and statin and plavix, add B complex   Continued Need for Acute Rehabilitation Level of Care: The patient requires daily medical management by a physician with specialized training in physical medicine and rehabilitation for the following reasons: Direction of a  multidisciplinary physical rehabilitation program to maximize functional independence : Yes Medical management of patient stability for increased activity during participation in an intensive rehabilitation regime.: Yes Analysis of laboratory values and/or radiology reports with any subsequent need for medication adjustment and/or medical intervention. : Yes   I attest that I was present, lead the team conference, and concur with the assessment and plan of the team.   Chana Bode B 08/17/2023, 12:40 PM

## 2023-08-18 DIAGNOSIS — I6310 Cerebral infarction due to embolism of unspecified precerebral artery: Secondary | ICD-10-CM | POA: Diagnosis not present

## 2023-08-18 LAB — GLUCOSE, CAPILLARY
Glucose-Capillary: 138 mg/dL — ABNORMAL HIGH (ref 70–99)
Glucose-Capillary: 146 mg/dL — ABNORMAL HIGH (ref 70–99)
Glucose-Capillary: 161 mg/dL — ABNORMAL HIGH (ref 70–99)

## 2023-08-18 MED ORDER — EMPAGLIFLOZIN 25 MG PO TABS
25.0000 mg | ORAL_TABLET | Freq: Every day | ORAL | Status: DC
  Administered 2023-08-19 – 2023-08-24 (×6): 25 mg via ORAL
  Filled 2023-08-18 (×6): qty 1

## 2023-08-18 MED ORDER — SODIUM CHLORIDE 0.9 % IV SOLN
2.0000 g | Freq: Three times a day (TID) | INTRAVENOUS | Status: DC
  Administered 2023-08-18 – 2023-08-24 (×18): 2 g via INTRAVENOUS
  Filled 2023-08-18 (×22): qty 40

## 2023-08-18 NOTE — Progress Notes (Signed)
Occupational Therapy Session Note  Patient Details  Name: Jeremy Valencia MRN: [id number] Date of Birth: 10-28-1940  {CHL IP REHAB OT TIME CALCULATIONS:[id number]}   Short Term Goals: Week 2:  OT Short Term Goal 1 (Week 2): STG=LTG d/t ELOS  Skilled Therapeutic Interventions/Progress Updates:  Pt greeted *** for skilled OT session with focus on ***.   Pain: Pt reported ***/10 pain, stating "***" in reference to ***. OT offering intermediate rest breaks and positioning suggestions throughout session to address pain/fatigue and maximize participation/safety in session.   Functional Transfers:  Self Care Tasks:  Therapeutic Activities:  Therapeutic Exercise:   Education:  Pt remained *** with 4Ps assessed and immediate needs met. Pt continues to be appropriate for skilled OT intervention to promote further functional independence in ADLs/IADLs.    Therapy Documentation Precautions:  Precautions Precautions: Fall Precaution Comments: R Hemi Restrictions Weight Bearing Restrictions Per Provider Order: No   Therapy/Group: Individual Therapy  Lou Cal, OTR/L, MSOT  08/18/2023, 9:14 PM

## 2023-08-18 NOTE — Progress Notes (Signed)
Occupational Therapy Weekly Progress Note  Patient Details  Name: Jeremy Valencia MRN: [id number] Date of Birth: August 07, 1941  Beginning of progress report period: August 12, 2023 End of progress report period: August 18, 2023  Today's Date: 08/18/2023 OT Individual Time: 4098-1191 OT Individual Time Calculation (min): 49 min    Patient has met 4 of 4 short term goals. Mr. Deary is steadily progressing towards reaching his LTGs. He is completing UB dressing/bathing with min A, LB dressing/bathing mod A, toileting mod A using RW, and grooming/hygiene tasks at a supervision level. He is demonstrating improved attention to his R visual field and R hemi-body requiring min-mod verbal cues during BADLs. Pt is using A RW for functional transfers to toilet with CGA and to TTB with min A.  Patient continues to demonstrate the following deficits: muscle weakness, decreased cardiorespiratoy endurance, impaired timing and sequencing, abnormal tone, unbalanced muscle activation, and decreased coordination, decreased attention to right, decreased initiation, decreased attention, decreased awareness, decreased problem solving, decreased safety awareness, and decreased memory, central origin, and decreased sitting balance, decreased standing balance, decreased postural control, hemiplegia, and decreased balance strategies and therefore will continue to benefit from skilled OT intervention to enhance overall performance with BADL and Reduce care partner burden.  Patient progressing toward long term goals..  Continue plan of care.  OT Short Term Goals Week 1:  OT Short Term Goal 1 (Week 1): Pt will complete UB  bathing and dressing with Min A and use of hemi dressing techniques, OT Short Term Goal 1 - Progress (Week 1): Met OT Short Term Goal 2 (Week 1): Pt will complete LB bathing and dressing with Mod A and use of AE and hemi dressing techniques. OT Short Term Goal 2 - Progress (Week 1): Met OT Short Term  Goal 3 (Week 1): Pt will demonstrate improved right side awareness by turning their head and visually scanning to locate items placed on their right side in 3/6 trials with moderate verbal or tactile cueing, to support engagement in basic functional self care tasks. OT Short Term Goal 3 - Progress (Week 1): Met OT Short Term Goal 4 (Week 1): Pt will demonstrate improved toiletig skills while completing 3/3 steps with Max A. OT Short Term Goal 4 - Progress (Week 1): Met Week 2:  OT Short Term Goal 1 (Week 2): STG=LTG d/t ELOS  Skilled Therapeutic Interventions/Progress Updates:     Directly handed off from PT in therapy gym presenting to be in good spirits receptive to skilled OT session reporting 0/10 pain- OT offering intermittent rest breaks, repositioning, and therapeutic support to optimize participation in therapy session. Pt's adult children and wife present for family education focused session. Eduction provided on CVA etiology and recovery process, DME recommendations of TTB and continued use of their elevated toilet seat, and recommendations for follow-up HHOT services and 24/7 supervision. Educated on Pt;s current functional status in completing BADLs including levels of assist and gait belt use during functional transfers. Educated on fall preventions, energy conservation, and simple home modifications to increase Pt's safety. Pt's family provided pictures of home environment and measurements to allow for therapy spaces to be set-up to simulate home as close as possible. Pt's house noted to be cluttered and have multiple narrow walk ways d/t large pieces of furniture and large throw rugs throughout the house- OT highly recommended to take up all throw rugs and clear walk-ways to prevent falls. In tub room, educated and demonstrated technique for TTB with emphasis on  safety precautions and simple cues to provide Pt with all caregivers verbalizing understanding. Pt able to complete ambulatory  transfer in/out of TTB with min A using RW. Pt would benefit from additional practice with this to support learning and carryover. Education provided on R UE positioning in bed and chair, R inattention, and day time vs night time splint use. Pt able to complete functional mobility throughout session using RW with CGA to close supervision >133ft. Pt returned to bed at end of session with bed elevated to home bed height and bed flattened with CGA and mod verbal cues for technique- recommending use of bed rial at home. After therapy session, Pt's family reporting they are unsure if they will be able to take care of Pt by d/c date and requested extension to allow for increased family education and time to make home modifications- OT informed medical team of Pt's request to increase safety upon d/c. Caregivers reporting all questions were answered at end of session. Pt was left resting in bed with call bell in reach, bed alarm on, and all needs met.  SLP entering room at end of session.  Therapy Documentation Precautions:  Precautions Precautions: Fall Precaution Comments: R Hemi Restrictions Weight Bearing Restrictions Per Provider Order: No  Therapy/Group: Individual Therapy  [name] 08/18/2023, 8:00 AM

## 2023-08-18 NOTE — Progress Notes (Addendum)
Patient ID: Jeremy Valencia, male   DOB: [birth year], 82 y.o.   MRN: [id number]  [facility] referral submitted to [facility] pending d/c VS. SNF.   11:43 AM: Patient declined by [facility]- no reason reported.   [facility] referral sent to [facility].   11:48 AM Patient approved for SN/PT/OT/SLP.

## 2023-08-18 NOTE — Progress Notes (Signed)
Physical Therapy Session Note  Patient Details  Name: Jeremy Valencia MRN: [id number] Date of Birth: [birth year]  Today's Date: 08/18/2023 PT Individual Time: 2841-3244 and 1300-1330 PT Individual Time Calculation (min): 26 min and 30 min  Short Term Goals: Week 1:  PT Short Term Goal 1 (Week 1): Pt will transfer sup to sit w/ CGA consistently. PT Short Term Goal 2 (Week 1): Pt will transfer sit to stand w/ Min A PT Short Term Goal 3 (Week 1): Pt will amb w/ CGA x 50'  Skilled Therapeutic Interventions/Progress Updates:   Treatment Session 1 Received pt semi-reclined in bed with RN at bedside administering medications. Attempted to find quad cane or SPC to trial, but only able to find bariatric SPC. Pt agreeable to PT treatment and denied any pain during session. Session with emphasis on functional mobility/transfers, dressing, generalized strengthening and endurance, and ambulation. Pt transferred semi-reclined<>sitting L EOB with HOB slightly elevated and use of bedrails with supervision. Donned pants and shoes sitting EOB with max A for time management purposes. Donned R wrist brace with max A and stood from EOB with RW and min A and required mod A for clothing management on R side. Pt ambulated to recliner with RW and min A. Concluded session with pt sitting in recliner, needs within reach, and seatbelt alarm on.  Treatment Session 2 Received pt semi-reclined in bed with daughter, daughter in law, son, and wife present for family education. Pt agreeable to PT treatment and did not state pain level during session. Session with emphasis on discharge planning, bed mobility, functional mobility/transfers, generalized strengthening and endurance, curb navigation, and ambulation.   Pt's family showed pictures of entire home setup. Pt's family reports pt as high bed (34in); therefore simulated pt's bed at home using hospital bed. Pt performed bed mobility from 34in high bed x 2 trials from flat bed  without rails mod I due to need for increased time. Pt ambulated 173ft with RW and supervision to main therapy gym - assist to manage RUE on RW hand orthosis. Pt has 2 6in steps with no rails or 1 8in curb to enter home - demonstrated technique for curb navigation and pt navigated 1 8in curb with RW and min A x 2 trials, trial 1 with therapist and trial 2 with wife (and therapist assisting wife/pt) - pt/wife required cues for RW placement/management when ascending/descending step and pt needs further practice with task.   Educated pt's family on the following: - recommendation to use RW with R hand orthosis with all mobility - securing/unstrapping RUE from RW hand orthosis - recommendation to have one of pt's children assist with step/curb navigation and suggested getting railings installed on front 2 steps  Discussed having pt's family attend additional PT family education session prior to D/C, but pt's wife/family concerned with ability to provide 24/7 care at home and possibly leaning towards SNF. Concluded session with pt sitting in chair in main therapy gym - hand off to OT.   Therapy Documentation Precautions:  Precautions Precautions: Fall Precaution Comments: R Hemi Restrictions Weight Bearing Restrictions Per Provider Order: No  Therapy/Group: Individual Therapy Marlana Salvage Zaunegger Blima Rich PT, DPT 08/18/2023, 7:03 AM

## 2023-08-18 NOTE — NC FL2 (Signed)
[place] MEDICAID FL2 LEVEL OF CARE FORM     IDENTIFICATION  Patient Name: Jeremy Valencia Birthdate: 11/07/1940 Sex: male Admission Date (Current Location): 08/11/2023  [facility] and [place] Number:  [place] N/A Facility and Address:  [facility], 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) 08/09/2023   Endocarditis 08/09/2023   Sepsis (HCC) 08/02/2023   Acute metabolic encephalopathy 08/02/2023   Chronic obstructive pulmonary disease (COPD) (HCC) 08/02/2023   Overweight (BMI 25.0-29.9) 11/16/2021   Glaucoma 11/16/2021   Hyperlipidemia 11/16/2021   Constipation 11/16/2021   General weakness 11/15/2021   Acute encephalopathy 11/15/2021   Other emphysema (HCC) 01/22/2018   Benign essential tremor 01/18/2018   Insomnia, 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/18/2018   Tremor of hands and face 01/18/2018   Fever 10/26/2017   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   Persistent proteinuria 01/18/2016   Senile purpura (HCC) 01/18/2016   Type 2 diabetes mellitus (HCC) 06/19/2015   Microscopic hematuria 06/19/2015   Enlarged prostate 06/19/2015   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   Diabetes mellitus, type 2 (HCC) 03/13/2015   DD (diverticular disease) 03/13/2015   Acid reflux 03/13/2015   Chronic low back pain 01/08/2015   Essential (primary) hypertension 07/18/2014   Enthesopathy of ankle and tarsus 07/17/2014    Dupuytren's contracture of foot 07/17/2014   Capsulitis of right foot    Plantar fasciitis    Bursitis 05/16/2013    Orientation RESPIRATION BLADDER Height & Weight        Normal Incontinent Weight: 191 lb 9.3 oz (86.9 kg) Height:  6\' 1"  (185.4 cm)  BEHAVIORAL SYMPTOMS/MOOD NEUROLOGICAL BOWEL NUTRITION STATUS      Incontinent Diet (min oral dysphagia)  AMBULATORY STATUS COMMUNICATION OF NEEDS Skin   Limited Assist Verbally Normal, Other (Comment) (Generalized rashes. Hydrocortisone cream as scheduled.)                       Personal Care Assistance Level of Assistance  Bathing, Feeding, Dressing Bathing Assistance: Limited assistance Feeding assistance: Limited assistance Dressing Assistance: Limited assistance     Functional Limitations Info  Sight, Hearing, Speech Sight Info: Adequate Hearing Info: Adequate Speech Info: Impaired    SPECIAL CARE FACTORS FREQUENCY  PT (By licensed PT), OT (By licensed OT), Speech therapy     PT Frequency: 5x a week OT Frequency: 5x a week     Speech Therapy Frequency: 5x a week      Contractures Contractures Info: Not present    Additional Factors Info  Code Status Code Status Info: FULL CODE Allergies Info: Metformin, Rofecoxib, Lamisil (Terbinafine)           Current Medications (08/18/2023):  This is the current hospital active medication list Current Facility-Administered Medications  Medication Dose Route Frequency Provider Last Rate Last Admin   acetaminophen (TYLENOL) tablet 325-650 mg  325-650 mg Oral Q4H PRN Setzer, Lynnell Jude, PA-C  alum & mag hydroxide-simeth (MAALOX/MYLANTA) 200-200-20 MG/5ML suspension 30 mL  30 mL Oral Q4H PRN Setzer, Lynnell Jude, PA-C       aspirin chewable tablet 81 mg  81 mg Oral Daily Milinda Antis, PA-C   81 mg at 08/18/23 1610   atorvastatin (LIPITOR) tablet 20 mg  20 mg Oral Daily Milinda Antis, PA-C   20 mg at 08/18/23 9604   B-complex with vitamin C tablet 1 tablet  1 tablet  Oral Daily Raulkar, Drema Pry, MD   1 tablet at 08/18/23 0911   bisacodyl (DULCOLAX) EC tablet 5 mg  5 mg Oral Daily PRN Milinda Antis, PA-C       Chlorhexidine Gluconate Cloth 2 % PADS 6 each  6 each Topical Q12H Raulkar, Drema Pry, MD   6 each at 08/18/23 0536   cholecalciferol (VITAMIN D3) 25 MCG (1000 UNIT) tablet 2,000 Units  2,000 Units Oral Daily Horton Chin, MD   2,000 Units at 08/18/23 0911   clopidogrel (PLAVIX) tablet 75 mg  75 mg Oral Daily Milinda Antis, PA-C   75 mg at 08/18/23 5409   diclofenac Sodium (VOLTAREN) 1 % topical gel 2 g  2 g Topical TID PRN Milinda Antis, PA-C       diphenhydrAMINE (BENADRYL) capsule 25 mg  25 mg Oral Q6H PRN Fanny Dance, MD       Melene Muller ON 08/19/2023] empagliflozin (JARDIANCE) tablet 25 mg  25 mg Oral Daily Raulkar, Drema Pry, MD       enoxaparin (LOVENOX) injection 40 mg  40 mg Subcutaneous Q24H Milinda Antis, PA-C   40 mg at 08/17/23 2100   guaiFENesin-dextromethorphan (ROBITUSSIN DM) 100-10 MG/5ML syrup 10 mL  10 mL Oral Q6H PRN Milinda Antis, PA-C   10 mL at 08/11/23 2055   hydrALAZINE (APRESOLINE) tablet 10 mg  10 mg Oral Q8H Raulkar, Drema Pry, MD   10 mg at 08/18/23 0535   hydrocortisone cream 1 %   Topical BID Horton Chin, MD   Given at 08/18/23 0912   insulin glargine-yfgn [facility]) injection 5 Units  5 Units Subcutaneous Daily Raulkar, Drema Pry, MD   5 Units at 08/17/23 2101   lisinopril (ZESTRIL) tablet 10 mg  10 mg Oral Daily Raulkar, Drema Pry, MD   10 mg at 08/18/23 0911   magnesium gluconate (MAGONATE) tablet 250 mg  250 mg Oral QHS Horton Chin, MD   250 mg at 08/17/23 2101   melatonin tablet 5 mg  5 mg Oral QHS Milinda Antis, PA-C   5 mg at 08/17/23 2101   meropenem (MERREM) 2 g in sodium chloride 0.9 % 100 mL IVPB  2 g Intravenous Q8H Ravishankar, Rhodia Albright, MD       methocarbamol (ROBAXIN) tablet 500 mg  500 mg Oral Q6H PRN Milinda Antis, PA-C   500 mg at 08/14/23 2119   ondansetron  (ZOFRAN) tablet 4 mg  4 mg Oral Q6H PRN Milinda Antis, PA-C       Or   ondansetron [facility]) injection 4 mg  4 mg Intravenous Q6H PRN Setzer, Lynnell Jude, PA-C       polyethylene glycol (MIRALAX / GLYCOLAX) packet 17 g  17 g Oral Daily PRN Setzer, Lynnell Jude, PA-C       sodium phosphate (FLEET) enema 1 enema  1 enema Rectal Once PRN Setzer, Lynnell Jude, PA-C       timolol (TIMOPTIC) 0.5 % ophthalmic solution 1  drop  1 drop Left Eye Daily Milinda Antis, PA-C   1 drop at 08/18/23 6644   traZODone (DESYREL) tablet 25-50 mg  25-50 mg Oral QHS PRN Fanny Dance, MD       vancomycin [facility]s) IVPB 750 mg/150 mL  750 mg Intravenous Q12H Raulkar, Drema Pry, MD 150 mL/hr at 08/17/23 2302 750 mg at 08/17/23 2302     Discharge Medications: Please see discharge summary for a list of discharge medications.  Relevant Imaging Results:  Relevant Lab Results:   Additional Information SS: [id number]  Andria Rhein

## 2023-08-18 NOTE — Progress Notes (Addendum)
Patient ID: Jeremy Valencia, male   DOB: Dec 24, 1940, 82 y.o.   MRN: [id number]  Sw received a call from pt's daughter, Jeremy Valencia. Jeremy Valencia expressing that they do not feel as the patient is ready for d/c home. Family is now considering SNF. SW educated daughter that patient is managed Medicare and a authorization has to be submitted to determine if patient is appropriate for a SNF transfer.   SW educated daughter that patient also has to be aware of this transfer.   Patient d/c set for 12/18, SW informed daughter that SW would need to make SW aware of their decision ASAP due to upcoming discharge. SW will be OOO on Monday 12/16. Family plans to discuss and confirm today between 12-1 PM.   SW made team aware.

## 2023-08-18 NOTE — Progress Notes (Signed)
Patient ID: Jeremy Valencia, male   DOB: 1941-01-26, 82 y.o.   MRN: [id number]  SW met with son, DIL, spouse and daughter in the room to discuss discharge plan. Family does not agree with pt's current goals. Family express they anticipated additional time on CIR. SW expressed patient progressed through goals quicker than anticipated. Estimated goals of MIN A, patient has SUP/CGA goals for discharge.   Family would like to make their decision after education.  SW informed family Sw would need to know the plan ASAP to allow the team time to plan for the appropriate location.   SW will be OOO on 08/21/23

## 2023-08-18 NOTE — Plan of Care (Signed)
Patient alert/oriented X4. Patient ambulated down hall using a walker and tolerated IV abx's. Patient splint remains on R wrist and was compliant with medication administration. Patient VSS, no complaints at this time.  Problem: Consults Goal: RH STROKE PATIENT EDUCATION Description: See Patient Education module for education specifics  Outcome: Progressing   Problem: RH BOWEL ELIMINATION Goal: RH STG MANAGE BOWEL WITH ASSISTANCE Description: STG Manage Bowel with mod I Assistance. Outcome: Progressing   Problem: RH BOWEL ELIMINATION Goal: RH STG MANAGE BOWEL W/MEDICATION W/ASSISTANCE Description: STG Manage Bowel with Medication with mod I Assistance. Outcome: Progressing   Problem: RH BLADDER ELIMINATION Goal: RH STG MANAGE BLADDER WITH ASSISTANCE Description: STG Manage Bladder With mod I Assistance Outcome: Progressing   Problem: RH BLADDER ELIMINATION Goal: RH STG MANAGE BLADDER WITH MEDICATION WITH ASSISTANCE Description: STG Manage Bladder With Medication With mod I Assistance. Outcome: Progressing   Problem: RH SAFETY Goal: RH STG ADHERE TO SAFETY PRECAUTIONS W/ASSISTANCE/DEVICE Description: STG Adhere to Safety Precautions With cues Assistance/Device. Outcome: Progressing   Problem: RH PAIN MANAGEMENT Goal: RH STG PAIN MANAGED AT OR BELOW PT'S PAIN GOAL Description: < 4 with prns Outcome: Progressing   Problem: RH KNOWLEDGE DEFICIT Goal: RH STG INCREASE KNOWLEDGE OF DIABETES Description: Patient and spouse will be able to manage DM with medications and dietary modifications using educational resources independently Outcome: Progressing   Problem: RH KNOWLEDGE DEFICIT Goal: RH STG INCREASE KNOWLEDGE OF HYPERTENSION Description: Patient and spouse will be able to manage HTN with medications and dietary modifications using educational resources independently Outcome: Progressing   Problem: RH KNOWLEDGE DEFICIT Goal: RH STG INCREASE KNOWLEDGE OF DYSPHAGIA/FLUID  INTAKE Description: Patient and spouse will be able to manage Dysphagia, medications and dietary modifications using educational resources independently Outcome: Progressing   Problem: RH KNOWLEDGE DEFICIT Goal: RH STG INCREASE KNOWLEGDE OF HYPERLIPIDEMIA Description: Patient and spouse will be able to manage HLD with medications and dietary modifications using educational resources independently Outcome: Progressing   Problem: RH KNOWLEDGE DEFICIT Goal: RH STG INCREASE KNOWLEDGE OF 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

## 2023-08-18 NOTE — Progress Notes (Signed)
Speech Language Pathology Weekly Progress and Session Note  Patient Details  Name: Jeremy Valencia MRN: [id number] Date of Birth: Sep 24, 1940  Beginning of progress report period: August 11, 2023 End of progress report period: August 18, 2023  Today's Date: 08/18/2023 SLP Individual Time: 0222-0245 SLP Individual Time Calculation (min): 23 min  Short Term Goals: Week 1: SLP Short Term Goal 1 (Week 1): Patient will utilize swallowing compensatory strategies during consumption of D3/thin diet given supervision multimodal A SLP Short Term Goal 1 - Progress (Week 1): Met SLP Short Term Goal 2 (Week 1): Patient will demonstrate problem solving abilities in basic situations given mod multimodal A SLP Short Term Goal 2 - Progress (Week 1): Met SLP Short Term Goal 3 (Week 1): Patient will recall and utilize memory compensatory strategies given mod multimodal A SLP Short Term Goal 3 - Progress (Week 1): Met SLP Short Term Goal 4 (Week 1): Patient will recall two cognitive and two physical changes since admission given mod multimodal A SLP Short Term Goal 4 - Progress (Week 1): Not met    New Short Term Goals: Week 2: SLP Short Term Goal 1 (Week 2): STGs= LTGS due to ELOS  Weekly Progress Updates: Pt has made steady progress and has met 3 of 4 STGs this reporting period due to consumption of D3 solids and thin liquids, improved recall, and problem solving. Currently pt conitnus to require mod A for increased complexitity recall and problem solving task with lack of awareness of current deficts. Pt/ family education ongoing. Pt would benefit from continued skilled SLP intervention to maximize swallowing and cognitive function.   Intensity: Minumum of 1-2 x/day, 30 to 90 minutes Frequency: 3 to 5 out of 7 days Duration/Length of Stay: 10-14 days Treatment/Interventions: Cognitive remediation/compensation;Dysphagia/aspiration precaution training;Cueing hierarchy;Therapeutic Activities;Functional  tasks;Patient/family education;Therapeutic Exercise   Daily Session  Skilled Therapeutic Interventions: Pt was seen in PM for family education session. Pt was alert and sen at bedside. His wife, dtr, son, and in laws present for session. SLP reviewed rationale for SLP including focus on cognition and swallowing function. SLP discussed memory compensatory strategies, strategies to improve brain health, and implementation of cognitively stimulating activities. SLP also reviewed current dietary recommendation, precautions, and s/sx aspiration. Family engaged in session however with greater focus on physical abilities at this time. All education supplemented with handouts. Pt left at bedside with call button within reach, family present, and bed alarm active. SLP to continue POC.     Pain Pain Assessment Pain Scale: 0-10 Pain Score: 0-No pain Multiple Pain Sites: No  Therapy/Group: Individual Therapy  Renaee Munda 08/18/2023, 3:54 PM

## 2023-08-18 NOTE — Progress Notes (Addendum)
Patient ID: Jeremy Valencia, male   DOB: 02/20/1941, 82 y.o.   MRN: [id number]  SW met with patient and family in room. The family has expressed that their has been discussion about extending. SW will wait for FU. As of now family has not made a determination of SNF VS. Home.   SW discussed with team. Team not has not made a final determination as of 3:47 PM, Sw will inform family that physician will follow up with determination on Monday 12/16.

## 2023-08-18 NOTE — Progress Notes (Signed)
PROGRESS NOTE   Subjective/Complaints: No new complaints this morning Rash is improved ID is starting meropenum today Family is considering SNF  ROS: +focal weakness, insomnia Denies chest pain, shortness of breath, abdominal pain, nausea / vomiting, +pruritic rash- much improved, +right wrist swelling and pain  Objective:   DG Wrist 2 Views Right Result Date: 08/16/2023 CLINICAL DATA:  Pain after physical therapy. EXAM: RIGHT WRIST - 2 VIEW COMPARISON:  X-ray 08/04/2023 FINDINGS: Advanced degenerative changes of the first carpometacarpal joint with the joint space loss, hypertrophic changes and sclerosis. Similar to previous. No acute fracture or dislocation. Preserved bone mineralization. Diffuse chondrocalcinosis and vascular calcifications. IMPRESSION: Degenerative changes particularly of the first carpometacarpal joint. Chondrocalcinosis. Electronically Signed   By: Karen Kays M.D.   On: 08/16/2023 17:43   No results for input(s): "WBC", "HGB", "HCT", "PLT" in the last 72 hours.  Recent Labs    08/16/23 0317 08/17/23 1117  NA 135 138  K 3.6 3.9  CL 103 102  CO2 24 25  GLUCOSE 97 144*  BUN 21 18  CREATININE 1.03 0.91  CALCIUM 8.3* 8.6*    Intake/Output Summary (Last 24 hours) at 08/18/2023 1020 Last data filed at 08/18/2023 0751 Gross per 24 hour  Intake 600 ml  Output --  Net 600 ml        Physical Exam: Vital Signs Blood pressure 138/81, pulse 91, temperature 98 F (36.7 C), resp. rate 16, height 6\' 1"  (1.854 m), weight 86.9 kg, SpO2 96%. Gen: no distress, normal appearing, appears comfortable sitting in bed HEENT: oral mucosa pink and moist, NCAT Cardio: Tachycardic Chest: CTAB, normal effort, normal rate of breathing Abd: soft, non-distended Ext: no edema Psych: pleasant, normal affect Skin: intact GU:  +catheter, draining clear urine.  Skin: + Left PICC line, intact. Diffuse, flat  erythematous rash along the Rash much improved          MSK:      No apparent deformity.  Mild swelling in right hand.       Neurologic exam:  Cognition: AAO to person, place, time and event.  Follows all simple commands, difficulty with higher-level cognitive tasks. Language: Fluent, No substitutions or neoglisms. No dysarthria. Names 3/3 objects correctly.  Memory: Recalls 0/3 objects at 5 minutes.  Insight: Good insight into current condition.  Mood: Pleasant affect, appropriate mood.  Flat sense of humor. Sensation: To light touch intact in BL UEs and LEs  Reflexes: 2+ in BL UE and LEs. Negative Hoffman's and babinski signs bilaterally.  CN: 2-12 grossly intact.  Coordination: No apparent tremors. No ataxia on LUE FTN, HTS bilaterally.  Spasticity: MAS 0 in all extremities.  Mild flexor tone in right distal fingers. Strength:                RUE: 3+/5 SA, 4/5 EF, 4/5 EE, 2+/5 WE, 3/5 FF, 3/5 FA                 LUE: 5/5 SA, 5/5 EF, 5/5 EE, 5/5 WE, 5/5 FF, 5/5 FA                 RLE: 4/5 HF, 4/5 KE, 4/5 DF,  4/5 EHL, 4/5 PF                 LLE:  5/5 HF, 5/5 KE, 5/5 DF, 5/5 EHL, 5/5 PF, stable 12/12         Assessment/Plan: 1. Functional deficits which require 3+ hours per day of interdisciplinary therapy in a comprehensive inpatient rehab setting. Physiatrist is providing close team supervision and 24 hour management of active medical problems listed below. Physiatrist and rehab team continue to assess barriers to discharge/monitor patient progress toward functional and medical goals  Care Tool:  Bathing    Body parts bathed by patient: Right arm, Left arm, Chest, Abdomen, Front perineal area, Face   Body parts bathed by helper: Buttocks, Right upper leg, Left upper leg, Right lower leg, Left lower leg     Bathing assist Assist Level: Moderate Assistance - Patient 50 - 74%     Upper Body Dressing/Undressing Upper body dressing   What is the patient wearing?: Pull  over shirt    Upper body assist Assist Level: Moderate Assistance - Patient 50 - 74%    Lower Body Dressing/Undressing Lower body dressing      What is the patient wearing?: Incontinence brief, Pants     Lower body assist Assist for lower body dressing: Maximal Assistance - Patient 25 - 49%     Toileting Toileting    Toileting assist Assist for toileting: Maximal Assistance - Patient 25 - 49%     Transfers Chair/bed transfer  Transfers assist     Chair/bed transfer assist level: Minimal Assistance - Patient > 75%     Locomotion Ambulation   Ambulation assist      Assist level: Minimal Assistance - Patient > 75% Assistive device: Walker-rolling Max distance: 141ft   Walk 10 feet activity   Assist     Assist level: Minimal Assistance - Patient > 75% Assistive device: Walker-rolling   Walk 50 feet activity   Assist Walk 50 feet with 2 turns activity did not occur: Safety/medical concerns  Assist level: Minimal Assistance - Patient > 75% Assistive device: Walker-rolling    Walk 150 feet activity   Assist Walk 150 feet activity did not occur: Safety/medical concerns  Assist level: Minimal Assistance - Patient > 75% Assistive device: Walker-rolling    Walk 10 feet on uneven surface  activity   Assist Walk 10 feet on uneven surfaces activity did not occur: Safety/medical concerns         Wheelchair     Assist Is the patient using a wheelchair?: Yes Type of Wheelchair: Manual    Wheelchair assist level: Moderate Assistance - Patient 50 - 74% Max wheelchair distance: 50    Wheelchair 50 feet with 2 turns activity    Assist        Assist Level: Moderate Assistance - Patient 50 - 74%   Wheelchair 150 feet activity     Assist      Assist Level: Total Assistance - Patient < 25%   Blood pressure 138/81, pulse 91, temperature 98 F (36.7 C), resp. rate 16, height 6\' 1"  (1.854 m), weight 86.9 kg, SpO2 96%.  Medical  Problem List and Plan: 1. 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  Vitamin D and B supplements started -Continue CIR  2.  Antithrombotics: -DVT/anticoagulation:  Pharmaceutical: Lovenox             -antiplatelet therapy: continue Aspirin and Plavix indefinitely (cox-2 inhibitor allergy but has tolerated aspirin in the past)   3. 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 Mood/Behavior/Sleep: LCSW to evaluate and provide emotional support             -antipsychotic agents: n/a  -Start trazodone 25 to 50 mg as needed for insomnia   5. Neuropsych/cognition: This patient is capable of making decisions on his own behalf.   6. Skin/Wound Care: Routine skin care checks   7. Fluids/Electrolytes/Nutrition: Routine Is and Os and follow-up chemistries             -dysphagia 3 diet/thin liquids             -SLP eval   8: Hypertension: monitor TID and prn (home hydrochlorothiazide 25 mg BID held)             -continue hydralazine 25 mg TID             -decrease lisinopril to 10mg   -magnesium gluconate 250mg  started HS  -12/9 blood pressure controlled, continue to monitor     08/18/2023    5:36 AM 08/17/2023    7:17 PM 08/17/2023    3:33 AM  Vitals with BMI  Systolic 138 127 161  Diastolic 81 74 80  Pulse 91 92 93      9: Hyperlipidemia: continue statin   10: Metabolic encephalopathy in the setting of sepsis, with possible cefepime neurotoxicity as a contributing factor: see #15   11: DM: CBGs QID, A1c = 7.3% (home regimen: glimepiride 4 mg q AM, Jardiance 10 mg daily)             -continue SSI             -continue Semglee 5 units q HS             -monitor  intake and consider restarting home meds  Increase Jardiance to 25mg   CBG (last 3)  Recent Labs    08/17/23 1138 08/17/23 1652 08/17/23 2051  GLUCAP 125* 172* 136*      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 Meropenem to start 12/13   16: Mildly elevated Bun: ensure adequate PO liquids and check BMP weekly   17. Overweight: d/c Ensure  18. Urinary incontinence. PVRs d/ced since not retaining significant amounts  19.  History of restless leg syndrome.  Patient denies symptoms.  continue to monitor for now  20. Rash: concerning for drug rash, will consult ID regarding whether antibiotics can be changed, hydrocortisone ordered, ceftriaxone d/ced as per ID recommendations, rash assessed and improved, meropenem to be started 12/13  21. Hypotension: decrease hydralazine to 10mg  TID, continue this dose, resolved  22. Right wrist swelling: XR ordered, discussed findings of  degeneration, continue wrist brace    LOS: 7 days A FACE TO FACE EVALUATION WAS PERFORMED  Viney Acocella P Armand Preast 08/18/2023, 10:20 AM

## 2023-08-19 DIAGNOSIS — I6381 Other cerebral infarction due to occlusion or stenosis of small artery: Secondary | ICD-10-CM | POA: Diagnosis present

## 2023-08-19 LAB — GLUCOSE, CAPILLARY
Glucose-Capillary: 100 mg/dL — ABNORMAL HIGH (ref 70–99)
Glucose-Capillary: 143 mg/dL — ABNORMAL HIGH (ref 70–99)
Glucose-Capillary: 137 mg/dL — ABNORMAL HIGH (ref 70–99)
Glucose-Capillary: 130 mg/dL — ABNORMAL HIGH (ref 70–99)

## 2023-08-19 NOTE — Progress Notes (Signed)
Occupational Therapy Session Note  Patient Details  Name: Jeremy Valencia MRN: [id number] Date of Birth: 02/04/1941  {CHL IP REHAB OT TIME CALCULATIONS:[id number]}   Short Term Goals: Week 2:  OT Short Term Goal 1 (Week 2): STG=LTG d/t ELOS  Skilled Therapeutic Interventions/Progress Updates:    Patient agreeable to participate in OT session. Reports *** pain level.   Completed family training with ***. Therapist educated family regarding ***. Education also provided on strategies and compensatory techniques to use if ***. Education provided for gait belt use and handling technique. *** completed hands on training for *** and *** transfer with therapist providing VC as needed for technique and form. All education completed and all questions     Therapy Documentation Precautions:  Precautions Precautions: Fall Precaution Comments: R Hemi Restrictions Weight Bearing Restrictions Per Provider Order: No  Therapy/Group: Individual Therapy  Limmie Patricia, OTR/L,CBIS  Supplemental OT - [facility] and [facility] Secure Chat Preferred   08/19/2023, 11:34 PM

## 2023-08-19 NOTE — Progress Notes (Signed)
PROGRESS NOTE   Subjective/Complaints:  Pt reports  (-)R wrist pain today- wearing splint.  LBM "just now".  Slept OK.     ROS:   Pt denies SOB, abd pain, CP, N/V/C/D, and vision changes Except for as below +focal weakness, insomnia  +pruritic rash- much improved, +right wrist swelling and pain  Objective:   No results found.  No results for input(s): "WBC", "HGB", "HCT", "PLT" in the last 72 hours.  Recent Labs    08/17/23 1117  NA 138  K 3.9  CL 102  CO2 25  GLUCOSE 144*  BUN 18  CREATININE 0.91  CALCIUM 8.6*    Intake/Output Summary (Last 24 hours) at 08/19/2023 1003 Last data filed at 08/19/2023 0511 Gross per 24 hour  Intake 360 ml  Output 400 ml  Net -40 ml        Physical Exam: Vital Signs Blood pressure (!) 161/85, pulse 88, temperature 97.6 F (36.4 C), temperature source Oral, resp. rate 18, height 6\' 1"  (1.854 m), weight 86.9 kg, SpO2 97%.    General: awake, alert, appropriate, sitting up in bed;  NAD HENT: conjugate gaze; oropharynx moist CV: regular rate and rhythm; no JVD Pulmonary:no W/R/R GI: soft, NT, ND, (+)BS Psychiatric: appropriate Neurological:  poor memory- sleepy GU:  +catheter, draining clear urine.  Skin: + Left PICC line, intact. Diffuse, flat erythematous rash along the Rash much improved          MSK:      No apparent deformity.  Mild swelling in right hand.       Neurologic exam:  Cognition: AAO to person, place, time and event.  Follows all simple commands, difficulty with higher-level cognitive tasks. Language: Fluent, No substitutions or neoglisms. No dysarthria. Names 3/3 objects correctly.  Memory: Recalls 0/3 objects at 5 minutes.  Insight: Good insight into current condition.  Mood: Pleasant affect, appropriate mood.  Flat sense of humor. Sensation: To light touch intact in BL UEs and LEs  Reflexes: 2+ in BL UE and LEs. Negative Hoffman's and  babinski signs bilaterally.  CN: 2-12 grossly intact.  Coordination: No apparent tremors. No ataxia on LUE FTN, HTS bilaterally.  Spasticity: MAS 0 in all extremities.  Mild flexor tone in right distal fingers. Strength:                RUE: 3+/5 SA, 4/5 EF, 4/5 EE, 2+/5 WE, 3/5 FF, 3/5 FA                 LUE: 5/5 SA, 5/5 EF, 5/5 EE, 5/5 WE, 5/5 FF, 5/5 FA                 RLE: 4/5 HF, 4/5 KE, 4/5 DF, 4/5 EHL, 4/5 PF                 LLE:  5/5 HF, 5/5 KE, 5/5 DF, 5/5 EHL, 5/5 PF, stable 12/12         Assessment/Plan: 1. Functional deficits which require 3+ hours per day of interdisciplinary therapy in a comprehensive inpatient rehab setting. Physiatrist is providing close team supervision and 24 hour management of active medical problems listed below.  Physiatrist and rehab team continue to assess barriers to discharge/monitor patient progress toward functional and medical goals  Care Tool:  Bathing    Body parts bathed by patient: Right arm, Left arm, Chest, Abdomen, Front perineal area, Face   Body parts bathed by helper: Buttocks, Right upper leg, Left upper leg, Right lower leg, Left lower leg     Bathing assist Assist Level: Moderate Assistance - Patient 50 - 74%     Upper Body Dressing/Undressing Upper body dressing   What is the patient wearing?: Pull over shirt    Upper body assist Assist Level: Moderate Assistance - Patient 50 - 74%    Lower Body Dressing/Undressing Lower body dressing      What is the patient wearing?: Incontinence brief, Pants     Lower body assist Assist for lower body dressing: Maximal Assistance - Patient 25 - 49%     Toileting Toileting    Toileting assist Assist for toileting: Maximal Assistance - Patient 25 - 49%     Transfers Chair/bed transfer  Transfers assist     Chair/bed transfer assist level: Minimal Assistance - Patient > 75%     Locomotion Ambulation   Ambulation assist      Assist level: Minimal Assistance  - Patient > 75% Assistive device: Walker-rolling Max distance: 132ft   Walk 10 feet activity   Assist     Assist level: Minimal Assistance - Patient > 75% Assistive device: Walker-rolling   Walk 50 feet activity   Assist Walk 50 feet with 2 turns activity did not occur: Safety/medical concerns  Assist level: Minimal Assistance - Patient > 75% Assistive device: Walker-rolling    Walk 150 feet activity   Assist Walk 150 feet activity did not occur: Safety/medical concerns  Assist level: Minimal Assistance - Patient > 75% Assistive device: Walker-rolling    Walk 10 feet on uneven surface  activity   Assist Walk 10 feet on uneven surfaces activity did not occur: Safety/medical concerns         Wheelchair     Assist Is the patient using a wheelchair?: Yes Type of Wheelchair: Manual    Wheelchair assist level: Moderate Assistance - Patient 50 - 74% Max wheelchair distance: 50    Wheelchair 50 feet with 2 turns activity    Assist        Assist Level: Moderate Assistance - Patient 50 - 74%   Wheelchair 150 feet activity     Assist      Assist Level: Total Assistance - Patient < 25%   Blood pressure (!) 161/85, pulse 88, temperature 97.6 F (36.4 C), temperature source Oral, resp. rate 18, height 6\' 1"  (1.854 m), weight 86.9 kg, SpO2 97%.  Medical Problem List and Plan: 1. 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  Vitamin D and B supplements started Con't CIR 2.  Antithrombotics: -DVT/anticoagulation:  Pharmaceutical: Lovenox             -antiplatelet therapy: continue Aspirin and Plavix indefinitely (cox-2 inhibitor allergy but has tolerated aspirin in the past)   3. 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 Mood/Behavior/Sleep: LCSW to evaluate and provide emotional support             -antipsychotic agents: n/a  -Start trazodone 25 to 50 mg as needed for insomnia   12/14- slept well 5. Neuropsych/cognition: This patient is capable of making decisions on his own behalf.   6. Skin/Wound Care: Routine skin care checks   7. Fluids/Electrolytes/Nutrition: Routine Is and Os and follow-up chemistries             -dysphagia 3 diet/thin liquids             -SLP eval   8: Hypertension: monitor TID and prn (home hydrochlorothiazide 25 mg BID held)             -continue hydralazine 25 mg TID             -decrease lisinopril to 10mg   -magnesium gluconate 250mg  started HS  -12/9 blood pressure controlled, continue to monitor  12/14- BP elevated this AM at 160's however has been running in 130s- cont' regimen and monitor trend    08/19/2023    5:02 AM 08/18/2023    5:49 PM 08/18/2023    5:36 AM  Vitals with BMI  Systolic 161 131 161  Diastolic 85 71 81  Pulse 88  91      9: Hyperlipidemia: continue statin   10: Metabolic encephalopathy in the setting of sepsis, with possible cefepime neurotoxicity as a contributing factor: see #15   11: DM: CBGs QID, A1c = 7.3% (home regimen: glimepiride 4 mg q AM, Jardiance 10 mg daily)             -continue SSI             -continue Semglee 5 units q HS             -monitor intake and consider restarting home meds  Increase Jardiance to 25mg   12/14- BG's running 100's 161- just got jardiance increased- wait ot make changes CBG (last 3)  Recent Labs    08/18/23 1649 08/18/23 2056 08/19/23 0548  GLUCAP 146* 161* 100*      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 Meropenem to start 12/13   12/14- suggest checking labs q Monday and Thursday to see if WBC has normalized before decreasing to [facility]. Last WBC 12/9- 15k- on Meropenem for another 24 days per ID order. -  16: Mildly elevated Bun: ensure adequate PO liquids and check BMP weekly   17. Overweight: d/c Ensure  18. Urinary incontinence. PVRs d/ced since not retaining significant amounts  19.  History of restless leg syndrome.  Patient denies symptoms.  continue to monitor for now  20. Rash: concerning for drug rash, will consult ID regarding whether antibiotics can be changed, hydrocortisone ordered, ceftriaxone d/ced as per ID recommendations, rash assessed and improved, meropenem to be started 12/13  21. Hypotension: decrease hydralazine to 10mg  TID, continue this dose, resolved  22. Right wrist swelling: XR ordered, discussed findings of degeneration, continue wrist brace   12/14-  denies wrist pain today  I spent a total of 36   minutes on total care today- >50% coordination of care- due to  Going over orders, labs; due to Meropenem- and vitals; CBG's. Is afebrile- suggest checking labs 2x/week for now? Until WBC normalizes.   LOS: 8 days A FACE TO FACE EVALUATION WAS PERFORMED  Lachandra Dettmann 08/19/2023, 10:03 AM

## 2023-08-19 NOTE — Progress Notes (Signed)
Physical Therapy Session Note  Patient Details  Name: Jeremy Valencia MRN: [id number] Date of Birth: 1941-08-16  Today's Date: 08/19/2023 PT Individual Time: 6283-1517 PT Individual Time Calculation (min): 40 min   Short Term Goals: Week 1:  PT Short Term Goal 1 (Week 1): Pt will transfer sup to sit w/ CGA consistently. PT Short Term Goal 2 (Week 1): Pt will transfer sit to stand w/ Min A PT Short Term Goal 3 (Week 1): Pt will amb w/ CGA x 50'  Skilled Therapeutic Interventions/Progress Updates:    Chart reviewed and pt agreeable to therapy. Pt received semi-reclined in bed with no c/o pain and RN in room for medication and line management. Session focused on functional transfers, self care, and amb to promote safe home access. Pt initiated session with transfer to EOB with S. Pt required maxA to thread pants and CGA + R PFRW for sit to stand. Pt required minA to donn pants and total A to donn R wrist brace. Pt able to verbalize safe standing technique. Pt then completed stand step turn transfer to chair using CGA + R PFRW. Pt  Pt then completed amb of 9ft + 21ft using CGA + RW. Pt also completed high knee marches and PFRW raises for 2 x 10 reps for strength training to support curb navigation practice in later therapies. Session education emphasized safe amb with AD. At end of session, pt was left seated in recliner with alarm engaged, nurse call bell and all needs in reach.     Therapy Documentation Precautions:  Precautions Precautions: Fall Precaution Comments: R Hemi Restrictions Weight Bearing Restrictions Per Provider Order: No General:       Therapy/Group: Individual Therapy  Dionne Milo, PT, DPT 08/19/2023, 8:50 AM

## 2023-08-19 NOTE — Progress Notes (Signed)
Speech Language Pathology Daily Session Note  Patient Details  Name: Jeremy Valencia MRN: [id number] Date of Birth: 21-Dec-1940  Today's Date: 08/19/2023 SLP Individual Time: 1345-1435 SLP Individual Time Calculation (min): 50 min  Short Term Goals: Week 2: SLP Short Term Goal 1 (Week 2): STGs= LTGS due to ELOS  Skilled Therapeutic Interventions: Pt seen for skilled ST with focus on cognitive communication goals, pt in recliner sleeping but agreeable to therapeutic tasks. Pt independent to recall name of primary treating SLPs and details of previous treatment sessions. SLP facilitating mildly complex problem solving tasks with focus on home environment by providing overall min A verbal cues for 75% accuracy. Discussed recommendations to avoid going into his basement and to call son/grandson as needed. Pt able to ID 2 physical and 2 cognitive changes s/p CVA, awareness and insight appear mostly intact. Pt able to recall novel, functional information with min A cues for 70% accuracy. Pt has supportive family to assist at discharge. Pt requesting to transfer to bed to sleep, min cues for safety and sequencing. Pt mod I for IV management during transfer. Pt left in bed with alarm set and all needs met, cont ST POC.   Pain none  Therapy/Group: Individual Therapy  Tacey Ruiz 08/19/2023, 2:36 PM

## 2023-08-20 DIAGNOSIS — I6381 Other cerebral infarction due to occlusion or stenosis of small artery: Secondary | ICD-10-CM | POA: Diagnosis not present

## 2023-08-20 LAB — GLUCOSE, CAPILLARY
Glucose-Capillary: 96 mg/dL (ref 70–99)
Glucose-Capillary: 143 mg/dL — ABNORMAL HIGH (ref 70–99)
Glucose-Capillary: 196 mg/dL — ABNORMAL HIGH (ref 70–99)
Glucose-Capillary: 179 mg/dL — ABNORMAL HIGH (ref 70–99)

## 2023-08-20 NOTE — Progress Notes (Signed)
Occupational Therapy Session Note  Patient Details  Name: BARRET PALMISANO MRN: [id number] Date of Birth: [birth year]  Today's Date: 08/20/2023 OT Individual Time: 4696-2952 OT Individual Time Calculation (min): 37 min    Short Term Goals: Week 1:  OT Short Term Goal 1 (Week 1): Pt will complete UB  bathing and dressing with Min A and use of hemi dressing techniques, OT Short Term Goal 1 - Progress (Week 1): Met OT Short Term Goal 2 (Week 1): Pt will complete LB bathing and dressing with Mod A and use of AE and hemi dressing techniques. OT Short Term Goal 2 - Progress (Week 1): Met OT Short Term Goal 3 (Week 1): Pt will demonstrate improved right side awareness by turning their head and visually scanning to locate items placed on their right side in 3/6 trials with moderate verbal or tactile cueing, to support engagement in basic functional self care tasks. OT Short Term Goal 3 - Progress (Week 1): Met OT Short Term Goal 4 (Week 1): Pt will demonstrate improved toiletig skills while completing 3/3 steps with Max A. OT Short Term Goal 4 - Progress (Week 1): Met  Skilled Therapeutic Interventions/Progress Updates:    Patient seated in the recliner at the time of arrival.  Patient in agreement with completing grooming task at sink LOF.  The pt was able to transfer the recliner to the w/c using the hemiwalker for additional balance with MinA using a stand pivot transfer. The pt was able to propel himself to the sink using his feet with CGA.  The pt was able to brush his teeth and comb his hair with s/u A and additional time.   The pt went on to tolerate manual manipulation of the scapular and surrounding structures to improve functional mobility of the RUE.  The pt was able to thread bilateral hands and observe the RUE during AROM into shld flexion, horizontal abduction 2 sets of 10  to improve communication to his RUE for functional gain as a prime mover. The pt required 1 rest break with each  exercise.  The pt was instructed in relaxation to improve compliance. At the end of the session, the pt remained at w/c LOF with his call light and bedside table within reach and all additional needs addressed.   Therapy Documentation Precautions:  Precautions Precautions: Fall Precaution Comments: R Hemi Restrictions Weight Bearing Restrictions Per Provider Order: No General:   Vital Signs: Therapy Vitals Temp: 98.1 F (36.7 C) Temp Source: Oral Pulse Rate: 97 Resp: 16 BP: 123/74 Patient Position (if appropriate): Lying Oxygen Therapy SpO2: 100 % O2 Device: Room Air Pain:   ADL: ADL Eating: Minimal assistance Where Assessed-Eating: Wheelchair Grooming: Minimal assistance Where Assessed-Grooming: Sitting at sink Upper Body Bathing: Maximal assistance Where Assessed-Upper Body Bathing: Sitting at sink Lower Body Bathing: Maximal assistance Where Assessed-Lower Body Bathing: Sitting at sink Upper Body Dressing: Moderate assistance Where Assessed-Upper Body Dressing: Wheelchair Lower Body Dressing: Maximal assistance Where Assessed-Lower Body Dressing: Wheelchair Toileting: Dependent Where Assessed-Toileting: Teacher, adult education: Maximal Dentist Method: Stand pivot, Proofreader: Engineer, technical sales: Not assessed Film/video editor: Not assessed Vision   Perception    Praxis   Balance   Exercises:   Other Treatments:     Therapy/Group: Individual Therapy  Lavona Mound 08/20/2023, 3:36 PM

## 2023-08-20 NOTE — Progress Notes (Signed)
PROGRESS NOTE   Subjective/Complaints:  Pt reports he's "just here" No issues LBM yesterday- notes needs to go again today.  Denies pain Does remember we discussed my HA's yesterday and asked about them.      ROS:    Pt denies SOB, abd pain, CP, N/V/C/D, and vision changes  Except for as below +focal weakness, insomnia  +pruritic rash- much improved, +right wrist swelling and pain  Objective:   No results found.  No results for input(s): "WBC", "HGB", "HCT", "PLT" in the last 72 hours.  Recent Labs    08/17/23 1117  NA 138  K 3.9  CL 102  CO2 25  GLUCOSE 144*  BUN 18  CREATININE 0.91  CALCIUM 8.6*    Intake/Output Summary (Last 24 hours) at 08/20/2023 0854 Last data filed at 08/20/2023 0739 Gross per 24 hour  Intake 240 ml  Output --  Net 240 ml        Physical Exam: Vital Signs Blood pressure 131/77, pulse 78, temperature 98.3 F (36.8 C), temperature source Oral, resp. rate 18, height 6\' 1"  (1.854 m), weight 86.9 kg, SpO2 97%.     General: awake, alert, appropriate, sitting up watching TV; NAD HENT: conjugate gaze; oropharynx moist CV: regular rate and rhythm; no JVD Pulmonary: CTA B/L; no W/R/R- good air movement GI: soft, NT, ND, (+)BS Psychiatric: appropriate Neurological: more alert- not sleepy today GU:  +catheter, draining clear urine.  Skin: + Left PICC line, intact. Diffuse, flat erythematous rash along the Rash much improved          MSK:      No apparent deformity.  Mild swelling in right hand.       Neurologic exam:  Cognition: AAO to person, place, time and event.  Follows all simple commands, difficulty with higher-level cognitive tasks. Language: Fluent, No substitutions or neoglisms. No dysarthria. Names 3/3 objects correctly.  Memory: Recalls 0/3 objects at 5 minutes.  Insight: Good insight into current condition.  Mood: Pleasant affect, appropriate mood.   Flat sense of humor. Sensation: To light touch intact in BL UEs and LEs  Reflexes: 2+ in BL UE and LEs. Negative Hoffman's and babinski signs bilaterally.  CN: 2-12 grossly intact.  Coordination: No apparent tremors. No ataxia on LUE FTN, HTS bilaterally.  Spasticity: MAS 0 in all extremities.  Mild flexor tone in right distal fingers. Strength:                RUE: 3+/5 SA, 4/5 EF, 4/5 EE, 2+/5 WE, 3/5 FF, 3/5 FA                 LUE: 5/5 SA, 5/5 EF, 5/5 EE, 5/5 WE, 5/5 FF, 5/5 FA                 RLE: 4/5 HF, 4/5 KE, 4/5 DF, 4/5 EHL, 4/5 PF                 LLE:  5/5 HF, 5/5 KE, 5/5 DF, 5/5 EHL, 5/5 PF, stable 12/12         Assessment/Plan: 1. Functional deficits which require 3+ hours per day of interdisciplinary therapy in  a comprehensive inpatient rehab setting. Physiatrist is providing close team supervision and 24 hour management of active medical problems listed below. Physiatrist and rehab team continue to assess barriers to discharge/monitor patient progress toward functional and medical goals  Care Tool:  Bathing    Body parts bathed by patient: Right arm, Left arm, Chest, Abdomen, Front perineal area, Face   Body parts bathed by helper: Buttocks, Right upper leg, Left upper leg, Right lower leg, Left lower leg     Bathing assist Assist Level: Moderate Assistance - Patient 50 - 74%     Upper Body Dressing/Undressing Upper body dressing   What is the patient wearing?: Pull over shirt    Upper body assist Assist Level: Moderate Assistance - Patient 50 - 74%    Lower Body Dressing/Undressing Lower body dressing      What is the patient wearing?: Incontinence brief, Pants     Lower body assist Assist for lower body dressing: Maximal Assistance - Patient 25 - 49%     Toileting Toileting    Toileting assist Assist for toileting: Maximal Assistance - Patient 25 - 49%     Transfers Chair/bed transfer  Transfers assist     Chair/bed transfer assist level:  Minimal Assistance - Patient > 75%     Locomotion Ambulation   Ambulation assist      Assist level: Minimal Assistance - Patient > 75% Assistive device: Walker-rolling Max distance: 168ft   Walk 10 feet activity   Assist     Assist level: Minimal Assistance - Patient > 75% Assistive device: Walker-rolling   Walk 50 feet activity   Assist Walk 50 feet with 2 turns activity did not occur: Safety/medical concerns  Assist level: Minimal Assistance - Patient > 75% Assistive device: Walker-rolling    Walk 150 feet activity   Assist Walk 150 feet activity did not occur: Safety/medical concerns  Assist level: Minimal Assistance - Patient > 75% Assistive device: Walker-rolling    Walk 10 feet on uneven surface  activity   Assist Walk 10 feet on uneven surfaces activity did not occur: Safety/medical concerns         Wheelchair     Assist Is the patient using a wheelchair?: Yes Type of Wheelchair: Manual    Wheelchair assist level: Moderate Assistance - Patient 50 - 74% Max wheelchair distance: 50    Wheelchair 50 feet with 2 turns activity    Assist        Assist Level: Moderate Assistance - Patient 50 - 74%   Wheelchair 150 feet activity     Assist      Assist Level: Total Assistance - Patient < 25%   Blood pressure 131/77, pulse 78, temperature 98.3 F (36.8 C), temperature source Oral, resp. rate 18, height 6\' 1"  (1.854 m), weight 86.9 kg, SpO2 97%.  Medical Problem List and Plan: 1. 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  Vitamin D and B supplements started Con't CIR- making progress- less sleepy this AM 2.  Antithrombotics: -DVT/anticoagulation:  Pharmaceutical: Lovenox              -  antiplatelet therapy: continue Aspirin and Plavix indefinitely (cox-2 inhibitor allergy but has tolerated aspirin in the past)   3. 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 Mood/Behavior/Sleep: LCSW to evaluate and provide emotional support             -antipsychotic agents: n/a  -Start trazodone 25 to 50 mg as needed for insomnia   12/14- 12/15 slept well 5. Neuropsych/cognition: This patient is capable of making decisions on his own behalf.   6. Skin/Wound Care: Routine skin care checks   7. Fluids/Electrolytes/Nutrition: Routine Is and Os and follow-up chemistries             -dysphagia 3 diet/thin liquids             -SLP eval   8: Hypertension: monitor TID and prn (home hydrochlorothiazide 25 mg BID held)             -continue hydralazine 25 mg TID             -decrease lisinopril to 10mg   -magnesium gluconate 250mg  started HS  -12/9 blood pressure controlled, continue to monitor  12/14- BP elevated this AM at 160's however has been running in 130s- cont' regimen and monitor trend  12/15- BP running 100's to 1 value of 142 systolic- con't regimen- won't make changes, since better than yesterday    08/20/2023    3:05 AM 08/19/2023    7:32 PM 08/19/2023    1:28 PM  Vitals with BMI  Systolic 131 143 962  Diastolic 77 59 60  Pulse 78 83 94      9: Hyperlipidemia: continue statin   10: Metabolic encephalopathy in the setting of sepsis, with possible cefepime neurotoxicity as a contributing factor: see #15   11: DM: CBGs QID, A1c = 7.3% (home regimen: glimepiride 4 mg q AM, Jardiance 10 mg daily)             -continue SSI             -continue Semglee 5 units q HS             -monitor intake and consider restarting home meds  Increase Jardiance to 25mg   12/14- BG's running 100's 161- just got jardiance increased- wait ot make changes  12/15- CBGs  100s-130s- much better- con't regimen CBG (last 3)  Recent Labs    08/19/23 1649 08/19/23 2044 08/20/23 0550  GLUCAP 137* 130* 96      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 Meropenem to start 12/13   12/14- suggest checking labs q Monday and Thursday to see if WBC has normalized before decreasing to [facility]. Last WBC 12/9- 15k- on Meropenem for another 24 days per ID order. 12/15- no fever- check  labs in AM- suggest checking CBC 2x/week until WBC normalized. -  16: Mildly elevated Bun: ensure adequate PO liquids and check BMP weekly   17. Overweight: d/c Ensure  18. Urinary incontinence. PVRs d/ced since not retaining significant amounts  19.  History of  restless leg syndrome.  Patient denies symptoms.  continue to monitor for now  20. Rash: concerning for drug rash, will consult ID regarding whether antibiotics can be changed, hydrocortisone ordered, ceftriaxone d/ced as per ID recommendations, rash assessed and improved, meropenem to be started 12/13  12/15- pt reports rash less itchy.   21. Hypotension: decrease hydralazine to 10mg  TID, continue this dose, resolved  12/15- BP doing better- con't regimen 22. Right wrist swelling: XR ordered, discussed findings of degeneration, continue wrist brace   12/14- denies wrist pain today    LOS: 9 days A FACE TO FACE EVALUATION WAS PERFORMED  Rosmary Dionisio 08/20/2023, 8:54 AM

## 2023-08-20 NOTE — Progress Notes (Signed)
Physical Therapy Session Note  Patient Details  Name: Jeremy Valencia MRN: [id number] Date of Birth: [birth year]  Today's Date: 08/20/2023 PT Individual Time: 4098-1191 + 1258 - 1338 PT Individual Time Calculation (min): 40 min + 40 min  Short Term Goals: Week 1:  PT Short Term Goal 1 (Week 1): Pt will transfer sup to sit w/ CGA consistently. PT Short Term Goal 2 (Week 1): Pt will transfer sit to stand w/ Min A PT Short Term Goal 3 (Week 1): Pt will amb w/ CGA x 50'  Skilled Therapeutic Interventions/Progress Updates:     Session 1: Chart reviewed and pt agreeable to therapy. Pt received supine in bed with no c/o pain. Also of note, NT present to complete pt hygiene after BM. Session focused on functional transfers, amb, and step navigation to promote safe home access. Pt initiated session with R and L rolling using Sup + VC. Pt then completed transfer to EOB using same assist level. Pt required modA to thread pants and CGA + RW for standing balance for dressing. Pt then completed amb to WC using CGA + RW. Pt taken to therapy gym for time management. In gym, pt completed blocked practice of step on/off 5" curb step. Pt required light CGA + RW with good demonstration of safe DME placement. Session education emphasized safe step up/down. Pt returned to room and required same assist level to return to recliner. At end of session, pt was left seated in recliner with alarm engaged, nurse call bell and all needs in reach.  Session 2: Chart reviewed and pt agreeable to therapy. Pt received semi-reclined in bed with no c/o pain. Also of note, family present t/o session for education. Session focused on bed mobility and curb stepping to promote safe home access. Pt initiated session with transfer to [facility] using sup + Rw. Pt taken to therapy gym for time management. In gym, PT discussed curb step safety with family including ensuring safe placement of AD. Pt completed 4x curb steps with using CGA + RW fading to  sup + RW. Daughter expressed concern for pt's wife's ability to supervise RW placement 2/2 macular degeneration, emphasizing need for pt to reach modI level with curb stepping. Pt returned to room and transferred back to bed using modI. Session education emphasized family support to encourage safe RW placement at 8" step. At end of session, pt was left semi-reclined in bed with alarm engaged, nurse call bell and all needs in reach.    Therapy Documentation Precautions:  Precautions Precautions: Fall Precaution Comments: R Hemi Restrictions Weight Bearing Restrictions Per Provider Order: No General:      Therapy/Group: Individual Therapy  Dionne Milo, PT, DPT 08/20/2023, 9:25 AM

## 2023-08-21 DIAGNOSIS — I6310 Cerebral infarction due to embolism of unspecified precerebral artery: Secondary | ICD-10-CM | POA: Diagnosis not present

## 2023-08-21 LAB — CBC WITH DIFFERENTIAL/PLATELET
Abs Immature Granulocytes: 0.05 10*3/uL (ref 0.00–0.07)
Basophils Absolute: 0.1 10*3/uL (ref 0.0–0.1)
Basophils Relative: 1 %
Eosinophils Absolute: 0.6 10*3/uL — ABNORMAL HIGH (ref 0.0–0.5)
Eosinophils Relative: 7 %
HCT: 43.8 % (ref 39.0–52.0)
Hemoglobin: 14.7 g/dL (ref 13.0–17.0)
Immature Granulocytes: 1 %
Lymphocytes Relative: 18 %
Lymphs Abs: 1.5 10*3/uL (ref 0.7–4.0)
MCH: 32.7 pg (ref 26.0–34.0)
MCHC: 33.6 g/dL (ref 30.0–36.0)
MCV: 97.3 fL (ref 80.0–100.0)
Monocytes Absolute: 1.1 10*3/uL — ABNORMAL HIGH (ref 0.1–1.0)
Monocytes Relative: 14 %
Neutro Abs: 4.9 10*3/uL (ref 1.7–7.7)
Neutrophils Relative %: 59 %
Platelets: 311 10*3/uL (ref 150–400)
RBC: 4.5 MIL/uL (ref 4.22–5.81)
RDW: 11.7 % (ref 11.5–15.5)
WBC: 8.2 10*3/uL (ref 4.0–10.5)
nRBC: 0 % (ref 0.0–0.2)

## 2023-08-21 LAB — COMPREHENSIVE METABOLIC PANEL
ALT: 21 U/L (ref 0–44)
AST: 19 U/L (ref 15–41)
Albumin: 2.5 g/dL — ABNORMAL LOW (ref 3.5–5.0)
Alkaline Phosphatase: 49 U/L (ref 38–126)
Anion gap: 9 (ref 5–15)
BUN: 16 mg/dL (ref 8–23)
CO2: 25 mmol/L (ref 22–32)
Calcium: 8.4 mg/dL — ABNORMAL LOW (ref 8.9–10.3)
Chloride: 101 mmol/L (ref 98–111)
Creatinine, Ser: 0.98 mg/dL (ref 0.61–1.24)
GFR, Estimated: >60 mL/min (ref >60)
Glucose, Bld: 116 mg/dL — ABNORMAL HIGH (ref 70–99)
Potassium: 3.6 mmol/L (ref 3.5–5.1)
Sodium: 135 mmol/L (ref 135–145)
Total Bilirubin: 0.9 mg/dL (ref <1.2)
Total Protein: 5.7 g/dL — ABNORMAL LOW (ref 6.5–8.1)

## 2023-08-21 LAB — GLUCOSE, CAPILLARY
Glucose-Capillary: 110 mg/dL — ABNORMAL HIGH (ref 70–99)
Glucose-Capillary: 209 mg/dL — ABNORMAL HIGH (ref 70–99)
Glucose-Capillary: 160 mg/dL — ABNORMAL HIGH (ref 70–99)
Glucose-Capillary: 212 mg/dL — ABNORMAL HIGH (ref 70–99)

## 2023-08-21 LAB — C-REACTIVE PROTEIN: CRP: 1.5 mg/dL — ABNORMAL HIGH (ref <1.0)

## 2023-08-21 LAB — SEDIMENTATION RATE: Sed Rate: 18 mm/h — ABNORMAL HIGH (ref 0–16)

## 2023-08-21 LAB — VANCOMYCIN, TROUGH: Vancomycin Tr: 16 ug/mL (ref 15–20)

## 2023-08-21 MED ORDER — POTASSIUM CHLORIDE 20 MEQ PO PACK
40.0000 meq | PACK | Freq: Once | ORAL | Status: AC
  Administered 2023-08-21: 40 meq via ORAL
  Filled 2023-08-21: qty 2

## 2023-08-21 MED ORDER — ARTIFICIAL TEARS OPHTHALMIC OINT
TOPICAL_OINTMENT | Freq: Two times a day (BID) | OPHTHALMIC | Status: DC
  Administered 2023-08-24: 1 via OPHTHALMIC
  Filled 2023-08-21: qty 3.5

## 2023-08-21 MED ORDER — VITAMIN D 25 MCG (1000 UNIT) PO TABS
3000.0000 [IU] | ORAL_TABLET | Freq: Every day | ORAL | Status: DC
  Administered 2023-08-22 – 2023-08-24 (×3): 3000 [IU] via ORAL
  Filled 2023-08-21 (×3): qty 3

## 2023-08-21 NOTE — Progress Notes (Signed)
PROGRESS NOTE   Subjective/Complaints: No new complaints this morning Discussed with team plan for SNF Labs reviewed and stable Klor supplement added   ROS:  Pt denies SOB, abd pain, CP, N/V/C/D, and vision changes  Except for as below +focal weakness, insomnia  +pruritic rash- much improved, +right wrist swelling and pain  Objective:   No results found.  Recent Labs    08/21/23 0214  WBC 8.2  HGB 14.7  HCT 43.8  PLT 311    Recent Labs    08/21/23 0214  NA 135  K 3.6  CL 101  CO2 25  GLUCOSE 116*  BUN 16  CREATININE 0.98  CALCIUM 8.4*    Intake/Output Summary (Last 24 hours) at 08/21/2023 1136 Last data filed at 08/21/2023 1027 Gross per 24 hour  Intake 476 ml  Output 325 ml  Net 151 ml        Physical Exam: Vital Signs Blood pressure 134/62, pulse 75, temperature 97.8 F (36.6 C), temperature source Oral, resp. rate 18, height 6\' 1"  (1.854 m), weight 86.9 kg, SpO2 95%.     General: awake, alert, appropriate, sitting up watching TV; NAD HENT: conjugate gaze; oropharynx moist CV: regular rate and rhythm; no JVD Pulmonary: CTA B/L; no W/R/R- good air movement GI: soft, NT, ND, (+)BS Psychiatric: appropriate Neurological: more alert- not sleepy today GU:  +catheter, draining clear urine.  Skin: + Left PICC line, intact. Diffuse, flat erythematous rash along the Rash much improved          MSK:      No apparent deformity.  Mild swelling in right hand.       Neurologic exam:  Cognition: AAO to person, place, time and event.  Follows all simple commands, difficulty with higher-level cognitive tasks. Language: Fluent, No substitutions or neoglisms. No dysarthria. Names 3/3 objects correctly.  Memory: Recalls 0/3 objects at 5 minutes.  Insight: Good insight into current condition.  Mood: Pleasant affect, appropriate mood.  Flat sense of humor. Sensation: To light touch intact in BL  UEs and LEs  Reflexes: 2+ in BL UE and LEs. Negative Hoffman's and babinski signs bilaterally.  CN: 2-12 grossly intact.  Coordination: No apparent tremors. No ataxia on LUE FTN, HTS bilaterally.  Spasticity: MAS 0 in all extremities.  Mild flexor tone in right distal fingers. Strength:                RUE: 3+/5 SA, 4/5 EF, 4/5 EE, 2+/5 WE, 3/5 FF, 3/5 FA                 LUE: 5/5 SA, 5/5 EF, 5/5 EE, 5/5 WE, 5/5 FF, 5/5 FA                 RLE: 4/5 HF, 4/5 KE, 4/5 DF, 4/5 EHL, 4/5 PF                 LLE:  5/5 HF, 5/5 KE, 5/5 DF, 5/5 EHL, 5/5 PF, stable 12/16         Assessment/Plan: 1. Functional deficits which require 3+ hours per day of interdisciplinary therapy in a comprehensive inpatient rehab setting. Physiatrist is providing  close team supervision and 24 hour management of active medical problems listed below. Physiatrist and rehab team continue to assess barriers to discharge/monitor patient progress toward functional and medical goals  Care Tool:  Bathing    Body parts bathed by patient: Right arm, Left arm, Chest, Abdomen, Front perineal area, Face   Body parts bathed by helper: Buttocks, Right upper leg, Left upper leg, Right lower leg, Left lower leg     Bathing assist Assist Level: Moderate Assistance - Patient 50 - 74%     Upper Body Dressing/Undressing Upper body dressing   What is the patient wearing?: Pull over shirt    Upper body assist Assist Level: Moderate Assistance - Patient 50 - 74%    Lower Body Dressing/Undressing Lower body dressing      What is the patient wearing?: Incontinence brief, Pants     Lower body assist Assist for lower body dressing: Maximal Assistance - Patient 25 - 49%     Toileting Toileting    Toileting assist Assist for toileting: Maximal Assistance - Patient 25 - 49%     Transfers Chair/bed transfer  Transfers assist     Chair/bed transfer assist level: Contact Guard/Touching assist      Locomotion Ambulation   Ambulation assist      Assist level: Contact Guard/Touching assist Assistive device: Walker-rolling Max distance: 166ft   Walk 10 feet activity   Assist     Assist level: Contact Guard/Touching assist Assistive device: Walker-rolling   Walk 50 feet activity   Assist Walk 50 feet with 2 turns activity did not occur: Safety/medical concerns  Assist level: Contact Guard/Touching assist Assistive device: Walker-rolling    Walk 150 feet activity   Assist Walk 150 feet activity did not occur: Safety/medical concerns  Assist level: Contact Guard/Touching assist Assistive device: Walker-rolling    Walk 10 feet on uneven surface  activity   Assist Walk 10 feet on uneven surfaces activity did not occur: Safety/medical concerns         Wheelchair     Assist Is the patient using a wheelchair?: Yes Type of Wheelchair: Manual    Wheelchair assist level: Moderate Assistance - Patient 50 - 74% Max wheelchair distance: 50    Wheelchair 50 feet with 2 turns activity    Assist        Assist Level: Moderate Assistance - Patient 50 - 74%   Wheelchair 150 feet activity     Assist      Assist Level: Total Assistance - Patient < 25%   Blood pressure 134/62, pulse 75, temperature 97.8 F (36.6 C), temperature source Oral, resp. rate 18, height 6\' 1"  (1.854 m), weight 86.9 kg, SpO2 95%.  Medical Problem List and Plan: 1. 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  Vitamin D and B supplements started Con't CIR- making progress- less sleepy this AM Discussed with team plan for SNF 2.  Antithrombotics: -DVT/anticoagulation:  Pharmaceutical: Lovenox             -antiplatelet therapy:  continue Aspirin and Plavix indefinitely (cox-2 inhibitor allergy but has tolerated aspirin in  the past)   3. 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 Mood/Behavior/Sleep: LCSW to evaluate and provide emotional support             -antipsychotic agents: n/a  -Start trazodone 25 to 50 mg as needed for insomnia   12/14- 12/15 slept well 5. Neuropsych/cognition: This patient is capable of making decisions on his own behalf.   6. Skin/Wound Care: Routine skin care checks   7. Fluids/Electrolytes/Nutrition: Routine Is and Os and follow-up chemistries             -dysphagia 3 diet/thin liquids             -SLP eval   8: Hypertension: monitor TID and prn (home hydrochlorothiazide 25 mg BID held)             -continue hydralazine 25 mg TID             -decrease lisinopril to 10mg   -magnesium gluconate 250mg  started HS  -12/9 blood pressure controlled, continue to monitor  12/14- BP elevated this AM at 160's however has been running in 130s- cont' regimen and monitor trend  12/15- BP running 100's to 1 value of 142 systolic- con't regimen- won't make changes, since better than yesterday    08/21/2023    9:04 AM 08/21/2023    3:37 AM 08/20/2023    7:37 PM  Vitals with BMI  Systolic 134 131 841  Diastolic 62 66 77  Pulse  75 83      9: Hyperlipidemia: continue statin   10: Metabolic encephalopathy in the setting of sepsis, with possible cefepime neurotoxicity as a contributing factor: see #15   11: DM: CBGs QID, A1c = 7.3% (home regimen: glimepiride 4 mg q AM, Jardiance 10 mg daily)             -continue SSI             -continue Semglee 5 units q HS             -monitor intake and consider restarting home meds  Increase Jardiance to 25mg    CBG (last 3)  Recent Labs    08/20/23 1621 08/20/23 2058 08/21/23 0543  GLUCAP 196* 179* 110*      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 Meropenem to start 12/13   12/14- suggest checking labs q Monday and Thursday to see if WBC has normalized before decreasing to [facility]. Last WBC 12/9- 15k- on Meropenem for another 24 days per ID order. 12/15- no fever- check  labs in AM- suggest checking CBC 2x/week until WBC normalized. -  16: Mildly elevated Bun: ensure adequate PO liquids and check BMP weekly   17. Overweight: d/c Ensure  18. Urinary incontinence. PVRs d/ced since not retaining significant amounts  19.  History of restless leg syndrome.  Patient denies symptoms.  continue to monitor for now  20. Rash: concerning for drug rash, will consult ID regarding whether antibiotics can be changed, hydrocortisone ordered, ceftriaxone d/ced as per  ID recommendations, rash assessed and improved, meropenem to be started 12/13  12/15- pt reports rash less itchy.   21. Hypotension: decrease hydralazine to 10mg  TID, continue this dose, resolved   22. Right wrist swelling: XR ordered, discussed findings of degeneration, continue wrist brace      LOS: 10 days A FACE TO FACE EVALUATION WAS PERFORMED  Clint Bolder P Rodriquez Thorner 08/21/2023, 11:36 AM

## 2023-08-21 NOTE — Progress Notes (Addendum)
Patient ID: Jeremy Valencia, male   DOB: 05/03/1941, 82 y.o.   MRN: [id number]  This SW covering for primary SW, Lavera Guise.  4098- SW spoke with pt son Jeremy Valencia to discuss SNF or Home. Reports they were waiting to hear if there would be an extension. Reports father would still need be placement due to his mother's limitations and not being able to physically assist him. SW informed will follow-up once there is an update from the medical team.   SW discussed extension with medical team. Reports appropriate for a few days only if going home, otherwise, will need to move forward with SNF.   0941-Kayla or Morrie Sheldon currently Admissions Team for [facility]. SW left message for [facility] with Admissions team and waiting on follow-up.   1012- SW had in -depth conversation with tp son Jeremy Valencia. SW shared pt will be here until there is a bed offer and insurance approves--and he will continue to receive therapies, so no extension is needed. No family edu is needed either. Family and pt mother understand pt will need to go to SNF as his wife is not able to open containers to assist with IV abx. SW will wait for follow-up from [facility], and family will see if there are other SNFs of interest. SW informed will follow-up if there is an answer by the end of today, otherwise primary SW will follow-up.   Per PT dtr request for [place] to call his wife to discuss discharge plan.   68- SW spoke with pt wife to discuss SNF vs Home. She reports she is undecided. She reports she was upset because she was told he was approved for an extension. SW shared he will not be approved for an extension if he is not going home which would only be 1-2 days and this would not change the amount of support he will require at time of discharge. SW discussed SNF placement process in length. She understands he would be here until a bed offer is available. She remains undecided at this time. She is aware primary SW will follow-up to  discuss further.   1342- SW returned phone call to pt son Jeremy Valencia. SW explained above.Pt mother sounds as if she will like pursue SNF but remains unsure even with discussing care needs. SW informed attending will follow-up with his mother as well.   Cecile Sheerer, MSW, LCSW Office: [phone] Cell: [phone] Fax: [phone]

## 2023-08-21 NOTE — Progress Notes (Signed)
Pharmacy Antibiotic Note  Jeremy Valencia is a 82 y.o. male admitted to [facility] 08/11/2023 , transferred from [facility]. Pharmacy has been consulted for vancomycin dosing for culture negative endocarditis. 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  Per OPAT 12/6, antibiotic end date 09/12/23.  Vancomycin trough therapeutic today at 16  Plan: Conintue Vancomycin 750 mg IV q12h. Vanc trough goal 15-20 mcg/ml.   Height: 6\' 1"  (185.4 cm) Weight: 86.9 kg (191 lb 9.3 oz) IBW/kg (Calculated) : 79.9  Temp (24hrs), Avg:98 F (36.7 C), Min:97.8 F (36.6 C), Max:98.2 F (36.8 C)  Recent Labs  Lab 08/15/23 0928 08/16/23 0317 08/17/23 1117 08/21/23 0214 08/21/23 1136  WBC  --   --   --  8.2  --   CREATININE 1.15 1.03 0.91 0.98  --   VANCOTROUGH 20  --  17  --  16    Estimated Creatinine Clearance: 63.4 mL/min (by C-G formula based on SCr of 0.98 mg/dL).    Allergies  Allergen Reactions   Metformin Diarrhea   Rofecoxib Other (See Comments)    Other reaction(s): Unknown   Lamisil [Terbinafine] Rash   Thank you Okey Regal, PharmD 08/21/2023 1:44 PM

## 2023-08-21 NOTE — Progress Notes (Signed)
Physical Therapy Session Note  Patient Details  Name: Jeremy Valencia MRN: [id number] Date of Birth: 06-Oct-1940  Today's Date: 08/21/2023 PT Individual Time: 0731-0827 PT Individual Time Calculation (min): 56 min   Short Term Goals: Week 1:  PT Short Term Goal 1 (Week 1): Pt will transfer sup to sit w/ CGA consistently. PT Short Term Goal 2 (Week 1): Pt will transfer sit to stand w/ Min A PT Short Term Goal 3 (Week 1): Pt will amb w/ CGA x 50'  Skilled Therapeutic Interventions/Progress Updates:   Received pt semi-reclined in bed, pt agreeable to PT treatment, and denied any pain during session. Session with emphasis on functional mobility/transfers, dressing, generalized strengthening and endurance, gait training, and curb navigation. Pt transferred supine<>sitting L EOB from flat bed with supervision/mod I using bedrails. Donned pants and shoes sitting EOB with max A and stood from elevated EOB with RW and close supervision to pull pants over hips - assist to tie pants. Pt performed remainder of transfers with RW and CGA/close supervision throughout session. Pt requested to brush teeth and sat in WC at sink and brushed teeth/combed hair while therapist applied lotion to back. Pt required assist to place grip handle onto toothbrush due to RUE weakness. Pt then ambulated 146ft x 2 rials with RW and CGA fading to close supervision to/from main therapy gym with assist to manage IV pole. Pt navigated 1 8in curb with RW and CGA and able to lift RW up/down without assist. Of note, pt continues to require cues for RUE management with RW hand orthosis. Returned to room and concluded session with pt sitting in recliner, needs within reach, and seatbelt alarm on.   Therapy Documentation Precautions:  Precautions Precautions: Fall Precaution Comments: R Hemi Restrictions Weight Bearing Restrictions Per Provider Order: No   Therapy/Group: Individual Therapy Marlana Salvage Zaunegger Blima Rich PT,  DPT 08/21/2023, 6:52 AM

## 2023-08-21 NOTE — Progress Notes (Signed)
Occupational Therapy Session Note  Patient Details  Name: Jeremy Valencia MRN: [id number] Date of Birth: Jul 05, 1941  Today's Date: 08/21/2023 OT Individual Time: 1101-1134 OT Individual Time Calculation (min): 33 min  OT Individual Time: 1335-1415 OT Individual Time Calculation (min): 40 min   Short Term Goals: Week 2:  OT Short Term Goal 1 (Week 2): STG=LTG d/t ELOS  Skilled Therapeutic Interventions/Progress Updates:     AM Session:  Pt received sitting up in recliner with DTR present in room. Pt presenting with flat affect and to be mildly discouraged d/t not sleeping well last night. Pt receptive to skilled OT session reporting 0/10 pain- OT offering intermittent rest breaks, repositioning, and therapeutic support to optimize participation in therapy session. Pt's DTR inquiring about d/c date and plans- re-educated Pt's DTR that Pt's LOS could be extended to the end of the week to allow increased time for family education and to prepare the Pt's home for d/c, however Pt will likely need CGA at least to be safe with functional tranfers in/out of shower, car, and stairs and close supervision for BADLs and basic mobility tasks inside the home even with an extension. Pt will also need to have assistance from family members who can prepare meals and assist with all IADL tasks at this time. Pt's DTR unsure if the necessary level of support will be available so additional education provided on option of d/c to SNF d/t Pt's current functional deficits to increase safety at home. Pt's DTR also requesting for her mother to be able to speak with SW and MD with medical team informed. Remainder of therapy session focused on R UE NMRE to increase functional use for BADLs. Pt completed stand pivot recliner > EOB using RW CGA. Sitting EOB, engaged Pt in completing the following exercises using tan (extra soft) therputty:  -Bead removal x30 -Gross grasp squeezes x10 reps -Rolling into snake x5 reps -Pinches  2x10 reps Pt required mod verbal cues for technique, muscle engagement, and to decrease compensatory shoulder elevation. Stand pivot EOB>recliner using RW CGA. Pt was left resting in recliner with call bell in reach, setbelt alarm on, and all needs met.    PM Session:  Pt received sitting up in recliner presenting to be mildly frustrated and fatigued, requesting to use restroom at beginning of therapy session. Pt receptive to skilled OT session reporting 0/10 pain- OT offering intermittent rest breaks, repositioning, and therapeutic support to optimize participation in therapy session. Engaged Pt in completing functional mobility to BR using RW for endurance training- removed hand splint to trial during this session with education provided on importance of attending to R hand to maintain grasp with Pt receptive to education. Pt required CGA for balance when ambulating to BR with RW and mod verbal cues for RW management. Pt able to doff pants in standing with CGA and transfer to toilet with CGA provided or controled decent. Increased time provided on toilet d/t need for BM. Pt stood to complete posterior peri-care with pt initiating completing task using R UE and L UE stabilized on RW- assistance provided for cleanliness. Pt brought pants to waist with min A required to adjust pants over R hip. Pt ambulated to sink and stood using RW to wash hands with CGA and mod verbal cues required to locate paper towels and soap, seated rest break provided following. Engaged Pt in completing functional mobility using RW through busy hallways to work on selective attention, RW management, and R side attention. Pt able  to complete >198ft with close supervision to CGA with mod verbal cues for safety, awareness, and attention. Pt fatigued at end of session, requesting to return to bed. EOB>supine CGA. Pt was left resting in bed with call bell in reach, bed alarm on, and all needs met.    Therapy Documentation Precautions:   Precautions Precautions: Fall Precaution Comments: R Hemi Restrictions Weight Bearing Restrictions Per Provider Order: No   Therapy/Group: Individual Therapy  [name] 08/21/2023, 7:56 AM

## 2023-08-21 NOTE — Progress Notes (Signed)
Speech Language Pathology Daily Session Note  Patient Details  Name: Jeremy Valencia MRN: [id number] Date of Birth: 04/26/1941  Today's Date: 08/21/2023 SLP Individual Time: 4098-1191 SLP Individual Time Calculation (min): 28 min  Short Term Goals: Week 2: SLP Short Term Goal 1 (Week 2): STGs= LTGS due to ELOS  Skilled Therapeutic Interventions: SLP conducted skilled therapy targeting cognitive retraining goals. Patient discussed events from earlier therapy session vaguely but accurately given min assist overall. Patient engaged in calendar task but exhibited difficulty writing requested word, then perseverated on writing word correctly and unable to state purpose of change in handwriting (stroke impacting dominant side), and requiring mod assist for intellectual awareness of deficits. During attention/following directions accurately task, patient completed all directions with overall supervision cues and thoroughness. Patient was left in chair with call bell in reach and chair alarm set. SLP will continue to target goals per plan of care.       Pain Pain Assessment Pain Scale: 0-10 Pain Score: 0-No pain  Therapy/Group: Individual Therapy  Jeannie Done, M.A., CCC-SLP  [name] 08/21/2023, 9:07 AM

## 2023-08-22 DIAGNOSIS — G4700 Insomnia, unspecified: Secondary | ICD-10-CM | POA: Diagnosis not present

## 2023-08-22 DIAGNOSIS — E1169 Type 2 diabetes mellitus with other specified complication: Secondary | ICD-10-CM | POA: Diagnosis not present

## 2023-08-22 DIAGNOSIS — I6381 Other cerebral infarction due to occlusion or stenosis of small artery: Secondary | ICD-10-CM | POA: Diagnosis not present

## 2023-08-22 DIAGNOSIS — I10 Essential (primary) hypertension: Secondary | ICD-10-CM | POA: Diagnosis not present

## 2023-08-22 LAB — GLUCOSE, CAPILLARY
Glucose-Capillary: 114 mg/dL — ABNORMAL HIGH (ref 70–99)
Glucose-Capillary: 226 mg/dL — ABNORMAL HIGH (ref 70–99)
Glucose-Capillary: 176 mg/dL — ABNORMAL HIGH (ref 70–99)
Glucose-Capillary: 178 mg/dL — ABNORMAL HIGH (ref 70–99)

## 2023-08-22 MED ORDER — GLIMEPIRIDE 1 MG PO TABS
1.0000 mg | ORAL_TABLET | Freq: Every day | ORAL | Status: DC
  Administered 2023-08-23 – 2023-08-24 (×2): 1 mg via ORAL
  Filled 2023-08-22 (×2): qty 1

## 2023-08-22 NOTE — Progress Notes (Signed)
Physical Therapy Discharge Summary  Patient Details  Name: Jeremy Valencia MRN: [id number] Date of Birth: 09-Dec-1940  Date of Discharge from PT service:August 22, 2023  {CHL IP REHAB PT TIME CALCULATION:304800500}  Patient has met {NUMBERS 0-12:18577} of 7 long term goals due to improved balance, improved postural control, increased strength, improved attention, improved awareness, and improved coordination.  Patient to discharge at an ambulatory level Supervision using RW. Patient's care partner unavailable to provide the necessary physical and cognitive assistance at discharge, therefore family has requested to discharge to SNF  Reasons goals not met: ***  Recommendation:  Patient will benefit from ongoing skilled PT services in home health setting to continue to advance safe functional mobility, address ongoing impairments in transfers, generalized strengthening and endurance, dynamic standing balance/coordination, gait training, and to minimize fall risk.  Equipment: No equipment provided - TBD at next venue of care  Reasons for discharge: treatment goals met and discharge from hospital  Patient/family agrees with progress made and goals achieved: Yes  PT Discharge Precautions/Restrictions Precautions Precautions: Fall Precaution Comments: R Hemi Restrictions Weight Bearing Restrictions Per Provider Order: No Pain Interference   Cognition Overall Cognitive Status: Impaired/Different from baseline Arousal/Alertness: Awake/alert Orientation Level: Oriented X4 Memory: Impaired Awareness: Impaired Problem Solving: Impaired Safety/Judgment: Impaired Comments: decreased insight into deficits Sensation   Motor  Motor Motor: Hemiplegia Motor - Skilled Clinical Observations: R hemiparesis  Mobility Bed Mobility Bed Mobility: Rolling Right;Rolling Left;Sit to Supine;Supine to Sit Rolling Right: Independent with assistive device Rolling Left: Independent with assistive  device Supine to Sit: Independent with assistive device Sit to Supine: Independent with assistive device Transfers Transfers: Sit to Stand;Stand to Sit;Stand Pivot Transfers Sit to Stand: Supervision/Verbal cueing Stand to Sit: Supervision/Verbal cueing Stand Pivot Transfers: Supervision/Verbal cueing Stand Pivot Transfer Details: Verbal cues for safe use of DME/AE;Verbal cues for precautions/safety Stand Pivot Transfer Details (indicate cue type and reason): verbal cues for management of RUE with RW hand orthosis Transfer (Assistive device): Rolling walker Locomotion     Trunk/Postural Assessment  Cervical Assessment Cervical Assessment: Exceptions to [facility] (forward head) Thoracic Assessment Thoracic Assessment: Exceptions to [facility] (thoracic rounding) Lumbar Assessment Lumbar Assessment: Exceptions to [facility] (posterior pelvic tilt) Postural Control Postural Control: Deficits on evaluation Protective Responses: slowed and delayed - but improved since eval  Balance   Extremity Assessment        Marlana Salvage Zaunegger Blima Rich PT, DPT 08/22/2023, 7:04 AM

## 2023-08-22 NOTE — Progress Notes (Signed)
Speech Language Pathology Daily Session Note  Patient Details  Name: Jeremy Valencia MRN: [id number] Date of Birth: 02-25-1941  Today's Date: 08/22/2023 SLP Individual Time: 1302-1400 SLP Individual Time Calculation (min): 58 min  Short Term Goals: Week 2: SLP Short Term Goal 1 (Week 2): STGs= LTGS due to ELOS  Skilled Therapeutic Interventions:  Patient was seen in PM to address cognitive re- training. Pt was alert and seen in bed upon SLP arrival. He requested assistance to bathroom. He ambulated via RW with sup A for awareness of safety precautions. He was continent of bladder and returned to bed for completion of session. Pt reports upcoming discharge with plan to go to SNF as next venue of care. He verbalized rationale for decision including primary focus on not wanting to burden his wife or put her in an unsafe situation. When asked about deficits following CVA pt stated, "I probably don't think as clearly and I don't have the stamina." With min A he identified need for AD which he did not use at baseline. SLP challenged pt in R inattention and problem solving. During a structured task pt was challenged to identify a corresponding match to a picture stimulus. Pt initially requiring mod A in first trial which was able to be faded to sup A in subsequent trial. In additional minutes of session, SLP began medication management training. Pt reports having all meds in one drawer at home and independence with management. SLP reviewed current medication list with pt identifying 2-3 familiar medications and rationale. He reports many new meds. Despite information he declined need for pill box reporting he is well organized. Given medication scenarios presented verbally pt identified solutions with 80% acc with min A. At conclusion of session. Pt was left in bed with call button within reach and bed alarm active. SLP to continue POC.   Pain Pain Assessment Pain Scale: 0-10 Pain Score: 0-No  pain  Therapy/Group: Individual Therapy  Renaee Munda 08/22/2023, 3:58 PM

## 2023-08-22 NOTE — Progress Notes (Signed)
Physical Therapy Session Note  Patient Details  Name: Jeremy Valencia MRN: [id number] Date of Birth: [birth year]  Today's Date: 08/22/2023 PT Individual Time: 0830-0911 PT Individual Time Calculation (min): 41 min   Short Term Goals: Week 1:  PT Short Term Goal 1 (Week 1): Pt will transfer sup to sit w/ CGA consistently. PT Short Term Goal 2 (Week 1): Pt will transfer sit to stand w/ Min A PT Short Term Goal 3 (Week 1): Pt will amb w/ CGA x 50'  Skilled Therapeutic Interventions/Progress Updates:   Received pt semi-reclined in bed, pt agreeable to PT treatment, and denied any pain during session. Session with emphasis on discharge planning, functional mobility/transfers, dressing, generalized strengthening and endurance, dynamic standing balance/coordination, and simulated car transfers. Went through sensation, MMT, and pain interference questionnaire in preparation for discharge. Pt transferred semi-reclined<>sitting L EOB with HOB elevated and use of bedrails mod I. Donned pants and shoes sitting EOB with max A for time management purposes. Pt performed all transfers with RW and supervision throughout session and required assist to pull pants over hips and tie them. Pt transported to/from room in [facility] dependently for time management purposes. Pt performed simulated car transfer with RW and supervision - cues to get R foot completley into car, then ambulated 64ft on uneven surfaces (ramp) with RW and close supervision. Pt able to stand and pick up object from floor using reacher and supervision. Returned to room and pt left sitting at sink grooming, all needs within reach, and seatbelt alarm on. Pt's son present at bedside and MD arrived for morning rounds.   Therapy Documentation Precautions:  Precautions Precautions: Fall Precaution Comments: R Hemi Restrictions Weight Bearing Restrictions Per Provider Order: No   Therapy/Group: Individual Therapy Marlana Salvage Zaunegger Blima Rich PT,  DPT 08/22/2023, 7:07 AM

## 2023-08-22 NOTE — Progress Notes (Signed)
PROGRESS NOTE   Subjective/Complaints: Patient reports sleep is interrupted last night by staff members checking on him.  No additional concerns this morning.  His son is in the room-options for his SNF are being evaluated.   ROS:  Pt denies fever, chills, SOB, abd pain, CP, N/V/C/D, and vision changes  Except for as below +focal weakness, insomnia  +pruritic rash- much improved, +right wrist swelling and pain  Objective:   No results found.  Recent Labs    08/21/23 0214  WBC 8.2  HGB 14.7  HCT 43.8  PLT 311    Recent Labs    08/21/23 0214  NA 135  K 3.6  CL 101  CO2 25  GLUCOSE 116*  BUN 16  CREATININE 0.98  CALCIUM 8.4*    Intake/Output Summary (Last 24 hours) at 08/22/2023 1314 Last data filed at 08/22/2023 0900 Gross per 24 hour  Intake 357 ml  Output 200 ml  Net 157 ml        Physical Exam: Vital Signs Blood pressure 138/79, pulse 81, temperature (!) 97.5 F (36.4 C), temperature source Oral, resp. rate 16, height 6\' 1"  (1.854 m), weight 86.9 kg, SpO2 96%.     General: awake, alert, sitting in bed HENT: conjugate gaze; oropharynx moist CV: regular rate and rhythm; no JVD Pulmonary: CTA B/L; nonlabored breathing, on room air GI: soft, NT, ND, (+)BS Psychiatric: appropriate Neurological: Alert and awake GU:  +catheter, draining clear urine.  Skin: + Left PICC line, intact. Diffuse, flat erythematous rash along the Rash much improved          MSK:      No apparent deformity.  Mild swelling in right hand.       Neurologic exam:  Cognition: AAO to person, place, time and event.  Follows all simple commands, difficulty with higher-level cognitive tasks. Language: Fluent, No substitutions or neoglisms. No dysarthria. Names 3/3 objects correctly.  Memory: Recalls 0/3 objects at 5 minutes.  Insight: Good insight into current condition.  Mood: Pleasant affect, appropriate mood.  Flat  sense of humor. Sensation: To light touch intact in BL UEs and LEs  Reflexes: 2+ in BL UE and LEs. Negative Hoffman's and babinski signs bilaterally.  CN: 2-12 grossly intact.  Coordination: No apparent tremors. No ataxia on LUE FTN, HTS bilaterally.  Spasticity: MAS 0 in all extremities.  Mild flexor tone in right distal fingers. Strength:                RUE: 3+/5 SA, 4/5 EF, 4/5 EE, 2+/5 WE, 3/5 FF, 3/5 FA                 LUE: 5/5 SA, 5/5 EF, 5/5 EE, 5/5 WE, 5/5 FF, 5/5 FA                 RLE: 4/5 HF, 4/5 KE, 4/5 DF, 4/5 EHL, 4/5 PF                 LLE:  5/5 HF, 5/5 KE, 5/5 DF, 5/5 EHL, 5/5 PF, stable 12/17         Assessment/Plan: 1. Functional deficits which require 3+ hours per day  of interdisciplinary therapy in a comprehensive inpatient rehab setting. Physiatrist is providing close team supervision and 24 hour management of active medical problems listed below. Physiatrist and rehab team continue to assess barriers to discharge/monitor patient progress toward functional and medical goals  Care Tool:  Bathing    Body parts bathed by patient: Right arm, Left arm, Chest, Abdomen, Front perineal area, Face   Body parts bathed by helper: Buttocks, Right upper leg, Left upper leg, Right lower leg, Left lower leg     Bathing assist Assist Level: Moderate Assistance - Patient 50 - 74%     Upper Body Dressing/Undressing Upper body dressing   What is the patient wearing?: Pull over shirt    Upper body assist Assist Level: Moderate Assistance - Patient 50 - 74%    Lower Body Dressing/Undressing Lower body dressing      What is the patient wearing?: Incontinence brief, Pants     Lower body assist Assist for lower body dressing: Maximal Assistance - Patient 25 - 49%     Toileting Toileting    Toileting assist Assist for toileting: Maximal Assistance - Patient 25 - 49%     Transfers Chair/bed transfer  Transfers assist     Chair/bed transfer assist level:  Supervision/Verbal cueing     Locomotion Ambulation   Ambulation assist      Assist level: Supervision/Verbal cueing Assistive device: Walker-rolling Max distance: 1100ft   Walk 10 feet activity   Assist     Assist level: Supervision/Verbal cueing Assistive device: Walker-rolling   Walk 50 feet activity   Assist Walk 50 feet with 2 turns activity did not occur: Safety/medical concerns  Assist level: Supervision/Verbal cueing Assistive device: Walker-rolling    Walk 150 feet activity   Assist Walk 150 feet activity did not occur: Safety/medical concerns  Assist level: Supervision/Verbal cueing Assistive device: Walker-rolling    Walk 10 feet on uneven surface  activity   Assist Walk 10 feet on uneven surfaces activity did not occur: Safety/medical concerns   Assist level: Supervision/Verbal cueing Assistive device: Walker-rolling   Wheelchair     Assist Is the patient using a wheelchair?: Yes Type of Wheelchair: Manual    Wheelchair assist level: Dependent - Patient 0% Max wheelchair distance: >173ft    Wheelchair 50 feet with 2 turns activity    Assist        Assist Level: Dependent - Patient 0%   Wheelchair 150 feet activity     Assist      Assist Level: Dependent - Patient 0%   Blood pressure 138/79, pulse 81, temperature (!) 97.5 F (36.4 C), temperature source Oral, resp. rate 16, height 6\' 1"  (1.854 m), weight 86.9 kg, SpO2 96%.  Medical Problem List and Plan: 1. 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  Vitamin D and B supplements started Con't CIR- making progress- less sleepy this AM Discussed with team plan for SNF -Team conference tomorrow -Social work looking into the  SNF options 2.  Antithrombotics: -DVT/anticoagulation:  Pharmaceutical: Lovenox             -antiplatelet therapy:  continue Aspirin and Plavix indefinitely (cox-2 inhibitor allergy but has tolerated aspirin in the past)   3. 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 Mood/Behavior/Sleep: LCSW to evaluate and provide emotional support             -antipsychotic agents: n/a  -Start trazodone 25 to 50 mg as needed for insomnia   12/14- 12/15 slept well  12/17 continue trazodone, as needed for now.  Could consider scheduling if sleep continues to be an issue 5. Neuropsych/cognition: This patient is capable of making decisions on his own behalf.   6. Skin/Wound Care: Routine skin care checks   7. Fluids/Electrolytes/Nutrition: Routine Is and Os and follow-up chemistries             -dysphagia 3 diet/thin liquids             -SLP eval   8: Hypertension: monitor TID and prn (home hydrochlorothiazide 25 mg BID held)             -continue hydralazine 25 mg TID             -decrease lisinopril to 10mg   -magnesium gluconate 250mg  started HS  -12/9 blood pressure controlled, continue to monitor  12/14- BP elevated this AM at 160's however has been running in 130s- cont' regimen and monitor trend  12/15- BP running 100's to 1 value of 142 systolic- con't regimen- won't make changes, since better than yesterday  12/17 BP well-controlled continue current regimen    08/22/2023    4:03 AM 08/21/2023    7:37 PM 08/21/2023    2:49 PM  Vitals with BMI  Systolic 138 120 161  Diastolic 79 62 66  Pulse 81 88       9: Hyperlipidemia: continue statin   10: Metabolic encephalopathy in the setting of sepsis, with possible cefepime neurotoxicity as a contributing factor: see #15   11: DM: CBGs QID, A1c = 7.3% (home regimen: glimepiride 4 mg q AM, Jardiance 10 mg daily)              -continue SSI             -continue Semglee 5 units q HS             -monitor intake and consider restarting home meds  Increase Jardiance to 25mg  12/17 restart glimepiride at 1 mg tomorrow   CBG (last 3)  Recent Labs    08/21/23 2048 08/22/23 0602 08/22/23 1124  GLUCAP 212* 114* 226*      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 Meropenem to start 12/13   12/14- suggest checking labs q Monday and Thursday to see if WBC has normalized before decreasing to [facility]. Last WBC 12/9- 15k- on Meropenem for another 24 days per ID order. 12/15- no fever- check  labs in AM- suggest checking CBC 2x/week until WBC normalized. -  16: Mildly elevated Bun: ensure adequate PO liquids and check BMP weekly   17. Overweight: d/c Ensure  18. Urinary incontinence. PVRs  d/ced since not retaining significant amounts  19.  History of restless leg syndrome.  Patient denies symptoms.  continue to monitor for now  20. Rash: concerning for drug rash, will consult ID regarding whether antibiotics can be changed, hydrocortisone ordered, ceftriaxone d/ced as per ID recommendations, rash assessed and improved, meropenem to be started 12/13  12/15- pt reports rash less itchy.   21. Hypotension: decrease hydralazine to 10mg  TID, continue this dose, resolved   22. Right wrist swelling: XR ordered, discussed findings of degeneration, continue wrist brace      LOS: 11 days A FACE TO FACE EVALUATION WAS PERFORMED  Fanny Dance 08/22/2023, 1:14 PM

## 2023-08-22 NOTE — Progress Notes (Signed)
Occupational Therapy Session Note  Patient Details  Name: Jeremy Valencia MRN: [id number] Date of Birth: October 24, 1940  Today's Date: 08/22/2023 OT Individual Time: 2130-8657 OT Individual Time Calculation (min): 70 min    Short Term Goals: Week 2:  OT Short Term Goal 1 (Week 2): STG=LTG d/t ELOS  Skilled Therapeutic Interventions/Progress Updates:     Pt received sitting up in wc with family members present in room. Pt presenting with flat affect, however receptive to skilled OT session reporting 0/10 pain- OT offering intermittent rest breaks, repositioning, and therapeutic support to optimize participation in therapy session. Engaged Pt in light hearted conversation throughout session to support improved moral and participation. Pt receptive to taking shower this session for BADL retraining to decrease overall bourdon of care and increase Pt's independence. Covered Pt's PICC line prior to shower. Prior to shower, Pt completed oral care using hemi-techniques seated in wc with set-up assist using R UE at a diminished level. Engaged Pt in completing functional mobility to BR using RW with close supervision provided for safety. Pt transferred to TTB positioned in walk-in shower using grab bars and RW CGA and mod verbal cues required for technique. Pt doffed clothing in seated position using hemi-techniques with CGA provided d/t set-up of environment to increase safety. Once seated in shower, Pt able to utilize R UE at a diminished level completing UB bathing with supervision min verbal cues for attention and sequencing. Pt completed LB bathing seated with CGA completing lateral leans to wash bottom and using long-handled sponge to wash feet with education provided on technique. Following shower, Pt completed functional mobility back to room using RW with close supervision. Pt able to recall correct hemi dressing technique donning shirt sitting EOB with supervision and LB dressing seated with CGA +increased  time and mod verbal cues for technique. Pt requesting to sit in wc at end of session. Stand pivot EOB>wc using RW supervision. Pt's wife and DTR participating throughout session for informal family education to learn more about Pt's current functional status and how best to support him as caregivers. Pt was left resting in wc with call bell in reach, seatbelt alarm on, and all needs met.    Therapy Documentation Precautions:  Precautions Precautions: Fall Precaution Comments: R Hemi Restrictions Weight Bearing Restrictions Per Provider Order: No   Therapy/Group: Individual Therapy  [name] 08/22/2023, 7:57 AM

## 2023-08-22 NOTE — Progress Notes (Signed)
Patient ID: Jeremy Valencia, male   DOB: October 26, 1940, 82 y.o.   MRN: [id number]  Patient authorization started with [facility] with [employer].   Reference # [id number] Clinicals sent to [phone]

## 2023-08-22 NOTE — Plan of Care (Signed)
  Problem: RH Stairs Goal: LTG Patient will ambulate up and down stairs w/assist (PT) Description: LTG: Patient will ambulate up and down # of stairs with assistance (PT) Flowsheets (Taken 08/22/2023 0709) LTG: Pt will ambulate up/down stairs assist needed:: Contact Guard/Touching assist LTG: Pt will  ambulate up and down number of stairs: 1 steps without rails for home entry

## 2023-08-22 NOTE — Progress Notes (Signed)
Patient ID: Jeremy Valencia, male   DOB: 1940/09/13, 82 y.o.   MRN: [id number]  SW met with patient, spouse, daughter in room. Pt and spouse have decided to move forward with SNF. Spouse has provided a preference of [facility].   SW will reach out for authorization today.  Sw informed pt, spouse and daughter again that SW is reaching out for authorization and this is a not a guarantee that patient will received auth to transfer to SNF.   Once SW received determination, Sw will update spouse. Spouse prefers to be the primary contact. No additional questions or concerns.  SW will submit auth today.

## 2023-08-23 LAB — GLUCOSE, CAPILLARY
Glucose-Capillary: 130 mg/dL — ABNORMAL HIGH (ref 70–99)
Glucose-Capillary: 174 mg/dL — ABNORMAL HIGH (ref 70–99)
Glucose-Capillary: 120 mg/dL — ABNORMAL HIGH (ref 70–99)
Glucose-Capillary: 197 mg/dL — ABNORMAL HIGH (ref 70–99)

## 2023-08-23 NOTE — Progress Notes (Signed)
Patient ID: Jeremy Valencia, male   DOB: 1941/01/13, 82 y.o.   MRN: [id number]  Team Conference Report to Patient/Family  Team Conference discussion was reviewed with the patient and caregiver, including goals, any changes in plan of care and target discharge date.  Patient and caregiver express understanding and are in agreement.  The patient has a target discharge date of 08/24/23.  Sw met with patient, spouse and son in room. Patient auth approved, plan for d/c to [facility] tomorrow.   Andria Rhein 08/23/2023, 3:22 PM

## 2023-08-23 NOTE — Plan of Care (Signed)
  Problem: Consults Goal: RH STROKE PATIENT EDUCATION Description: See Patient Education module for education specifics  Outcome: Progressing   Problem: RH BOWEL ELIMINATION Goal: RH STG MANAGE BOWEL WITH ASSISTANCE Description: STG Manage Bowel with mod I Assistance. Outcome: Progressing Goal: RH STG MANAGE BOWEL W/MEDICATION W/ASSISTANCE Description: STG Manage Bowel with Medication with mod I Assistance. Outcome: Progressing   Problem: RH BLADDER ELIMINATION Goal: RH STG MANAGE BLADDER WITH ASSISTANCE Description: STG Manage Bladder With mod I Assistance Outcome: Progressing Goal: RH STG MANAGE BLADDER WITH MEDICATION WITH ASSISTANCE Description: STG Manage Bladder With Medication With mod I Assistance. Outcome: Progressing   Problem: RH SAFETY Goal: RH STG ADHERE TO SAFETY PRECAUTIONS W/ASSISTANCE/DEVICE Description: STG Adhere to Safety Precautions With cues Assistance/Device. Outcome: Progressing   Problem: RH PAIN MANAGEMENT Goal: RH STG PAIN MANAGED AT OR BELOW PT'S PAIN GOAL Description: < 4 with prns Outcome: Progressing   Problem: RH KNOWLEDGE DEFICIT Goal: RH STG INCREASE KNOWLEDGE OF DIABETES Description: Patient and spouse will be able to manage DM with medications and dietary modifications using educational resources independently Outcome: Progressing Goal: RH STG INCREASE KNOWLEDGE OF HYPERTENSION Description: Patient and spouse will be able to manage HTN with medications and dietary modifications using educational resources independently Outcome: Progressing Goal: RH STG INCREASE KNOWLEDGE OF DYSPHAGIA/FLUID INTAKE Description: Patient and spouse will be able to manage Dysphagia, medications and dietary modifications using educational resources independently Outcome: Progressing Goal: RH STG INCREASE KNOWLEGDE OF HYPERLIPIDEMIA Description: Patient and spouse will be able to manage HLD with medications and dietary modifications using educational resources  independently Outcome: Progressing Goal: RH STG INCREASE KNOWLEDGE OF 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

## 2023-08-23 NOTE — Patient Care Conference (Signed)
Inpatient RehabilitationTeam Conference and Plan of Care Update Date: 08/23/2023   Time: 11:49 AM    Patient Name: Jeremy Valencia      Medical Record Number: [id number]  Date of Birth: 1941/05/06 Sex: Male         Room/Bed: 4W25C/4W25C-01 Payor Info: Payor: [employer] MEDICARE / Plan: [employer] MEDICARE HMO / Product Type: *No Product type* /    Admit Date/Time:  08/11/2023 12:06 PM  Primary Diagnosis:  Left thalamic infarction [facility])  Hospital Problems: Principal Problem:   Left thalamic infarction [facility])    Expected Discharge Date: Expected Discharge Date:  (SNF pending)  Team Members Present: Physician leading conference: Dr. Sula Soda Social Worker Present: Lavera Guise, BSW Nurse Present: Chana Bode, RN PT Present: Blima Rich, PT OT Present: Candee Furbish, OT SLP Present: Jeannie Done, SLP     Current Status/Progress Goal Weekly Team Focus  Bowel/Bladder   Patient continent of bowel. Patient has periods of incontinence of urine.   Patient will become more continent of urine.   Timed toileting to promote continence.    Swallow/Nutrition/ Hydration   mild oral dysphagia   mod I  trials of regular, pt/ education, awareness, and strategies    ADL's   U/LB bathing CGA seat on TTB using long-handled sponge, toileting CGA-min A, UB dressing supervision, LB dressing CGA-min A, grooming/hygine set-up; improved functional use of R UE and decreased swelling/pain   Supervision   Barriers- R UE hemi, mild apraxia, functional cognition, endurance deficits, decreased motivation; family education completed with DTR, son, and Wife    Mobility   bed mobility mod I, transfers with RW supervision, gait 169ft with RW supervision, 1 8in curb with RW CGA   supervision, CGA step  barriers: family does not feel wife is able to provide necessary 24/7 supervision/occasional assist upon discharge. Family Ed complete    Careers information officer Observations  mild to mod cogntiive deficits in awareness, memory, problem solving, and inattention   min A   implement strategeis, pt/ caregiver education, awareness    Pain   Patient reports no pain, has only reqired PRN tylenol intermittently.  Patient will be pain free.   Provide patient PRN medication to eliminate or lessen pain.    Skin   Patient has redness on bottom, nothing open.   Maintain skin integrity.  Promote frequent repositioning to prevent skin breakdown.      Discharge Planning:  SNF authorization pending   Team Discussion: Patient post left thalamic infarct with apraxia, decreased motivation, mild dysphagia and mild cognitive deficits.   Patient on target to meet rehab goals: yes, currently needs CGA for bathing ;sponge bathes and CGA for toileting  with supervision for upper body dressing and min assist for lower body dressing. Needs mod I for bed mobility but supervision for transfers with a RW.  Able to ambulate up to 150' using a RW with supervision. Needs CGA to navigate a curb.     *See Care Plan and progress notes for long and short-term goals.   Revisions to Treatment Plan:  Regular diet trials   Teaching Needs: Safety, medications, transfers, toileting, etc.   Current Barriers to Discharge: Decreased caregiver support and Home enviroment access/layout  Possible Resolutions to Barriers: SNF placement HH follow up services DME: RW     Medical Summary Current Status: Type 2 diabetes with hyperglycemia, left thalamic infarction, endicarditis  Barriers to  Discharge: Medical stability;Infection/IV Antibiotics  Barriers to Discharge Comments: Type 2 diabetes with hyperglycemia, left thalamic infarction, endocarditis Possible Resolutions to [employer] Focus: increased Jardiance to 25mg , continue aspirin/atorvastatin/Bo complex/D vitamin, continue IV antibiotics   Continued Need for Acute Rehabilitation Level of Care: The patient  requires daily medical management by a physician with specialized training in physical medicine and rehabilitation for the following reasons: Direction of a multidisciplinary physical rehabilitation program to maximize functional independence : Yes Medical management of patient stability for increased activity during participation in an intensive rehabilitation regime.: Yes Analysis of laboratory values and/or radiology reports with any subsequent need for medication adjustment and/or medical intervention. : Yes   I attest that I was present, lead the team conference, and concur with the assessment and plan of the team.   Chana Bode B 08/23/2023, 2:48 PM

## 2023-08-23 NOTE — Progress Notes (Addendum)
Patient ID: Jeremy Valencia, male   DOB: 1941/01/18, 82 y.o.   MRN: [id number]  Patient authorization approved.   Authorization #: [id number] Reference #: [id number]  CM: Loreli Slot: [phone] F: [phone]   SW reached out to AD at [facility]  Patient spouse, informed. With a plan for d/c to [facility] tomorrow.

## 2023-08-23 NOTE — Progress Notes (Signed)
Physical Therapy Session Note  Patient Details  Name: Jeremy Valencia MRN: [id number] Date of Birth: 1940/10/31  Today's Date: 08/23/2023 PT Individual Time: 3557-3220 PT Individual Time Calculation (min): 15 min   Short Term Goals: Week 2:  PT Short Term Goal 1 (Week 2): STG=LTG due to LOS  Skilled Therapeutic Interventions/Progress Updates:     Therapist present to make up missed minutes. Pt supine in bed upon arrival. Pt reports fatigue but agreeable to bed level therex. Pt denies any pain.   Pt performed the following therex for B LE strengthening/ROM/R inattention:  supine glute bridges x10 holding for 3 seconds Sidelying hip abduction B x10 with max verbal and tactile cues provided for technqiue.  SLR x10 B   Pt requesting to end session after after therex above to spend time with wife prior to her leaving. Pt supine in bed at end of session with bed alarm on and needs within reach.      Therapy Documentation Precautions:  Precautions Precautions: Fall Precaution Comments: R Hemi Restrictions Weight Bearing Restrictions Per Provider Order: No  Therapy/Group: Individual Therapy  [facility] Ambrose [place], [place], DPT  08/23/2023, 2:47 PM

## 2023-08-23 NOTE — Progress Notes (Signed)
Physical Therapy Weekly Progress Note  Patient Details  Name: Jeremy Valencia MRN: [id number] Date of Birth: 1941/03/30  Beginning of progress report period: August 12, 2023 End of progress report period: August 23, 2023  Patient has met 3 of 3 short term goals. Pt has demonstrated great progress towards long term goals. Pt is currently able to perform bed mobility mod I from flat bed without rails, transfers with RW and supervision, ambulate 167ft with RW and supervision, and navigate 1 8in curb with RW and CGA to simulate home entry. Pt continues to demonstrate slight R inattention, decreased insight into deficits, and is limited by decreased endurance/activity tolerance. Pt's family has attended family education training, however, pt's wife does not feel she is able to provide necessary assist upon discharge - therefore family has decided to pursue SNF.   Patient continues to demonstrate the following deficits muscle weakness, decreased cardiorespiratoy endurance, impaired timing and sequencing and decreased coordination, decreased awareness, decreased problem solving, decreased safety awareness, and decreased memory, and decreased standing balance, hemiplegia, and decreased balance strategies and therefore will continue to benefit from skilled PT intervention to increase functional independence with mobility.  Patient progressing toward long term goals..  Continue plan of care.  PT Short Term Goals Week 1:  PT Short Term Goal 1 (Week 1): Pt will transfer sup to sit w/ CGA consistently. PT Short Term Goal 1 - Progress (Week 1): Met PT Short Term Goal 2 (Week 1): Pt will transfer sit to stand w/ Min A PT Short Term Goal 2 - Progress (Week 1): Met PT Short Term Goal 3 (Week 1): Pt will amb w/ CGA x 50' PT Short Term Goal 3 - Progress (Week 1): Met Week 2:  PT Short Term Goal 1 (Week 2): STG=LTG due to LOS  Skilled Therapeutic Interventions/Progress Updates:  Ambulation/gait  training;Community reintegration;Neuromuscular re-education;Stair training;UE/LE Strength taining/ROM;Wheelchair propulsion/positioning;Therapeutic Activities;UE/LE Coordination activities;Discharge planning;Functional mobility training;Patient/family education;Therapeutic Exercise   Therapy Documentation Precautions:  Precautions Precautions: Fall Precaution Comments: R Hemi Restrictions Weight Bearing Restrictions Per Provider Order: No  Therapy/Group: Individual Therapy Marlana Salvage Zaunegger Blima Rich PT, DPT 08/23/2023, 6:52 AM

## 2023-08-23 NOTE — Progress Notes (Signed)
PROGRESS NOTE   Subjective/Complaints: No new complaints this morning Asks when he will be leaving Working with therapy Auth approved  ROS:  Pt denies fever, chills, SOB, abd pain, CP, N/V/C/D, and vision changes  Except for as below +focal weakness, insomnia  +pruritic rash- much improved, +right wrist swelling and pain- stable  Objective:   No results found.  Recent Labs    08/21/23 0214  WBC 8.2  HGB 14.7  HCT 43.8  PLT 311    Recent Labs    08/21/23 0214  NA 135  K 3.6  CL 101  CO2 25  GLUCOSE 116*  BUN 16  CREATININE 0.98  CALCIUM 8.4*    Intake/Output Summary (Last 24 hours) at 08/23/2023 1409 Last data filed at 08/23/2023 0818 Gross per 24 hour  Intake 2280 ml  Output --  Net 2280 ml        Physical Exam: Vital Signs Blood pressure 119/71, pulse 77, temperature 97.9 F (36.6 C), resp. rate 17, height 6\' 1"  (1.854 m), weight 86.9 kg, SpO2 94%. General: awake, alert, sitting in bed HENT: conjugate gaze; oropharynx moist CV: regular rate and rhythm; no JVD Pulmonary: CTA B/L; nonlabored breathing, on room air GI: soft, NT, ND, (+)BS Psychiatric: appropriate Neurological: Alert and awake GU:  +catheter, draining clear urine.  Skin: + Left PICC line, intact. Diffuse, flat erythematous rash along the Rash much improved          MSK:      No apparent deformity.  Mild swelling in right hand.       Neurologic exam:  Cognition: AAO to person, place, time and event.  Follows all simple commands, difficulty with higher-level cognitive tasks. Language: Fluent, No substitutions or neoglisms. No dysarthria. Names 3/3 objects correctly.  Memory: Recalls 0/3 objects at 5 minutes.  Insight: Good insight into current condition.  Mood: Pleasant affect, appropriate mood.  Flat sense of humor. Sensation: To light touch intact in BL UEs and LEs  Reflexes: 2+ in BL UE and LEs. Negative Hoffman's  and babinski signs bilaterally.  CN: 2-12 grossly intact.  Coordination: No apparent tremors. No ataxia on LUE FTN, HTS bilaterally.  Spasticity: MAS 0 in all extremities.  Mild flexor tone in right distal fingers. Strength:                RUE: 3+/5 SA, 4/5 EF, 4/5 EE, 2+/5 WE, 3/5 FF, 3/5 FA                 LUE: 5/5 SA, 5/5 EF, 5/5 EE, 5/5 WE, 5/5 FF, 5/5 FA                 RLE: 4/5 HF, 4/5 KE, 4/5 DF, 4/5 EHL, 4/5 PF                 LLE:  5/5 HF, 5/5 KE, 5/5 DF, 5/5 EHL, 5/5 PF, stable 12/18         Assessment/Plan: 1. Functional deficits which require 3+ hours per day of interdisciplinary therapy in a comprehensive inpatient rehab setting. Physiatrist is providing close team supervision and 24 hour management of active medical problems listed below.  Physiatrist and rehab team continue to assess barriers to discharge/monitor patient progress toward functional and medical goals  Care Tool:  Bathing    Body parts bathed by patient: Right arm, Left arm, Chest, Abdomen, Front perineal area, Face, Left lower leg, Right lower leg, Right upper leg, Left upper leg, Buttocks   Body parts bathed by helper: Buttocks, Right upper leg, Left upper leg, Right lower leg, Left lower leg     Bathing assist Assist Level: Contact Guard/Touching assist     Upper Body Dressing/Undressing Upper body dressing   What is the patient wearing?: Pull over shirt    Upper body assist Assist Level: Supervision/Verbal cueing    Lower Body Dressing/Undressing Lower body dressing      What is the patient wearing?: Incontinence brief, Pants     Lower body assist Assist for lower body dressing: Contact Guard/Touching assist     Toileting Toileting    Toileting assist Assist for toileting: Minimal Assistance - Patient > 75%     Transfers Chair/bed transfer  Transfers assist     Chair/bed transfer assist level: Supervision/Verbal cueing     Locomotion Ambulation   Ambulation assist       Assist level: Supervision/Verbal cueing Assistive device: Walker-rolling Max distance: 144ft   Walk 10 feet activity   Assist     Assist level: Supervision/Verbal cueing Assistive device: Walker-rolling   Walk 50 feet activity   Assist Walk 50 feet with 2 turns activity did not occur: Safety/medical concerns  Assist level: Supervision/Verbal cueing Assistive device: Walker-rolling    Walk 150 feet activity   Assist Walk 150 feet activity did not occur: Safety/medical concerns  Assist level: Supervision/Verbal cueing Assistive device: Walker-rolling    Walk 10 feet on uneven surface  activity   Assist Walk 10 feet on uneven surfaces activity did not occur: Safety/medical concerns   Assist level: Supervision/Verbal cueing Assistive device: Walker-rolling   Wheelchair     Assist Is the patient using a wheelchair?: Yes Type of Wheelchair: Manual    Wheelchair assist level: Dependent - Patient 0% Max wheelchair distance: >178ft    Wheelchair 50 feet with 2 turns activity    Assist        Assist Level: Dependent - Patient 0%   Wheelchair 150 feet activity     Assist      Assist Level: Dependent - Patient 0%   Blood pressure 119/71, pulse 77, temperature 97.9 F (36.6 C), resp. rate 17, height 6\' 1"  (1.854 m), weight 86.9 kg, SpO2 94%.  Medical Problem List and Plan: 1. 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  Vitamin D and B supplements started Con't CIR- making progress- less sleepy this AM Discussed with team plan for SNF -Team conference tomorrow -Social work looking into the SNF options 2.  Antithrombotics: -DVT/anticoagulation:  Pharmaceutical: Lovenox             -antiplatelet  therapy: continue Aspirin and Plavix indefinitely (cox-2 inhibitor allergy but has tolerated aspirin in the past)   3. 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 Mood/Behavior/Sleep: LCSW to evaluate and provide emotional support             -antipsychotic agents: n/a  -Start trazodone 25 to 50 mg as needed for insomnia   12/14- 12/15 slept well  12/17 continue trazodone, as needed for now.  Could consider scheduling if sleep continues to be an issue 5. Neuropsych/cognition: This patient is capable of making decisions on his own behalf.   6. Skin/Wound Care: Routine skin care checks   7. Fluids/Electrolytes/Nutrition: Routine Is and Os and follow-up chemistries             -dysphagia 3 diet/thin liquids             -SLP eval   8: Hypertension: monitor TID and prn (home hydrochlorothiazide 25 mg BID held)             -continue hydralazine 25 mg TID             -decrease lisinopril to 10mg   -magnesium gluconate 250mg  started HS  -12/9 blood pressure controlled, continue to monitor  12/14- BP elevated this AM at 160's however has been running in 130s- cont' regimen and monitor trend  12/15- BP running 100's to 1 value of 142 systolic- con't regimen- won't make changes, since better than yesterday  12/17 BP well-controlled continue current regimen    08/23/2023    3:08 AM 08/22/2023    7:16 PM 08/22/2023    2:11 PM  Vitals with BMI  Systolic 119 119 161  Diastolic 71 63 64  Pulse 77 82       9: Hyperlipidemia: continue statin   10: Metabolic encephalopathy in the setting of sepsis, with possible cefepime neurotoxicity as a contributing factor: see #15   11: DM: CBGs QID, A1c = 7.3% (home regimen: glimepiride 4 mg q AM, Jardiance 10 mg daily)             -continue SSI             -continue Semglee 5 units q HS             -monitor intake and consider  restarting home meds  Increase Jardiance to 25mg  12/17 restart glimepiride at 1 mg tomorrow   CBG (last 3)  Recent Labs    08/22/23 2109 08/23/23 0554 08/23/23 1144  GLUCAP 178* 130* 174*      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 Meropenem to start 12/13   WBC reviewed and normalized  16: Mildly elevated Bun: ensure adequate PO liquids and check BMP weekly   17. Overweight: d/c Ensure  18. Urinary incontinence. PVRs d/ced since not retaining significant amounts  19.  History of restless leg syndrome.  Patient denies symptoms.  continue to monitor for now  20. Rash: concerning for drug rash, will consult ID regarding whether antibiotics can be changed, hydrocortisone ordered, ceftriaxone d/ced as per ID recommendations, rash assessed and improved, meropenem to be started 12/13.   21. Hypotension: decrease hydralazine to 10mg  TID, continue this dose, resolved   22. Right wrist swelling: XR ordered,  discussed findings of degeneration, continue wrist brace      LOS: 12 days A FACE TO FACE EVALUATION WAS PERFORMED  Debbe Crumble P Emilee Market 08/23/2023, 2:09 PM

## 2023-08-23 NOTE — Progress Notes (Signed)
Occupational Therapy Session Note  Patient Details  Name: Jeremy Valencia MRN: [id number] Date of Birth: June 03, 1941  Today's Date: 08/23/2023 OT Individual Time: 0901-1000 OT Individual Time Calculation (min): 59 min    Short Term Goals: Week 2:  OT Short Term Goal 1 (Week 2): STG=LTG d/t ELOS  Skilled Therapeutic Interventions/Progress Updates:     Pt received semi-reclined in bed presenting with flat affect and to be mildly discouraged, receptive to skilled OT session reporting 0/10 pain- OT offering intermittent rest breaks, repositioning, and therapeutic support to optimize participation in therapy session. Throughout session, engaged Pt in light hearted conversation to support improved moral as Pt was reporting feeling sad that he was going to be d/c'ing to a SNF, although Pt able to verbalize reason for going to SNF to increase safety, avoid falls at home, and to receive his need medications. Pt's brief noted to be soiled upon OT arrival with Pt receptive to getting cleaned up during therapy session. Pt transitioned to EOB mod I +increased time. Sit > stand from EOB to RW supervision with Pt able to correctly recall hand placement independently this session. Pt completed functional mobility to BR and transferred to [facility] placed over toilet with supervision using RW.  OT doffed dirty brief total A  and donned clean brief following toileting total A for time management. Pt able to complete 3/3 toileting tasks this session with supervision following continent void- documented in flowsheets. Pt requesting to wash-up prior to completing dressing tasks. Pt able to complete U/LB bathing with supervision while seated on BSC placed over toilet. Pt donned clean shirt with supervision +increased time and min verbal cues for correct orientation of shirt. Pt weaved B LEs into pants in seated position with supervision with min verbal cues for problem solving and stood to bring pants to waist using RW close  supervision no LOB noted. Functional mobility back to room using RW close supervision. Pt requested to complete oral hygiene and grooming tasks seated in wc for energy conservation. Set-up environment with toiletry items positioned on R side of sink to facilitate visual scanning and increased attention to R. Pt able to utilize R UE throughout session at a diminished level with min verbal cues demonstrating improved awareness of R UE and improved functional use. Pt requesting to sit in recliner at end of session. Pt completed functional mobility to recliner using RW with close supervision. MD in/out at end of therapy session. Pt was left resting in recliner with call bell in reach, seatbelt alarm on, and all needs met.    Therapy Documentation Precautions:  Precautions Precautions: Fall Precaution Comments: R Hemi Restrictions Weight Bearing Restrictions Per Provider Order: No  Therapy/Group: Individual Therapy  [name] 08/23/2023, 7:54 AM

## 2023-08-23 NOTE — Procedures (Signed)
This is a delayed report as I was just notified by EEG technologist that this EEG has not been read. I was not on-call on the day of study.    Patient Name: Jeremy Valencia  MRN: [id number]  Epilepsy Attending: Charlsie Quest  Referring Physician/Provider: Caryl Pina, MD  Date: 08/23/2023 Duration: 29.26 mins  Patient history: 82yo M with ams getting eeg to evaluate for seizure  Level of alertness: Awake, asleep  AEDs during EEG study: None  Technical aspects: This EEG study was done with scalp electrodes positioned according to the 10-20 International system of electrode placement. Electrical activity was reviewed with band pass filter of 1-70Hz , sensitivity of 7 uV/mm, display speed of 93mm/sec with a 60Hz  notched filter applied as appropriate. EEG data were recorded continuously and digitally stored.  Video monitoring was available and reviewed as appropriate.  Description: The posterior dominant rhythm consists of 8 Hz activity of moderate voltage (25-35 uV) seen predominantly in posterior head regions, symmetric and reactive to eye opening and eye closing.  Sleep was characterized by vertex waves, sleep spindles (12 to 14 Hz), maximal frontocentral region.  Hyperventilation and photic stimulation were not performed.     IMPRESSION: This study is within normal limits. No seizures or epileptiform discharges were seen throughout the recording.  A normal interictal EEG does not exclude the diagnosis of epilepsy.  Yaqub Arney Annabelle Harman

## 2023-08-23 NOTE — Progress Notes (Signed)
Physical Therapy Session Note  Patient Details  Name: Jeremy Valencia MRN: [id number] Date of Birth: [birth year]  Today's Date: 08/23/2023 PT Individual Time: 1300-1355 PT Individual Time Calculation (min): 55 min  Today's Date: 08/23/2023 PT Missed Time: 20 Minutes Missed Time Reason: Patient fatigue;Patient unwilling to participate  Short Term Goals: Week 1:  PT Short Term Goal 1 (Week 1): Pt will transfer sup to sit w/ CGA consistently. PT Short Term Goal 2 (Week 1): Pt will transfer sit to stand w/ Min A PT Short Term Goal 3 (Week 1): Pt will amb w/ CGA x 50'  Skilled Therapeutic Interventions/Progress Updates:   Received pt semi-reclined in bed with family present at bedside - wife present to observe throughout session. Pt agreeable to PT treatment and denied any pain during session. Session with emphasis on discharge planning, functional mobility/transfers, generalized strengthening and endurance, dynamic standing balance/coordination, gait training, and stair navigation. Pt transferred semi-reclined<>sitting L EOB with HOB elevated and use of bedrails mod I.   Pt performed all transfers with RW and supervision throughout session. Pt ambulated 17ft x 2 trials with RW and close supervision to/from main therapy gym. Pt ambulated to/from staircase and navigated 12 6in steps with bilateral handrails and CGA/close supervision ascending and descending with a step to pattern. Pt then stood without AD and CGA and ambulated 219ft without AD and min A - pt demonstrating narrow BOS, decreased arm swing, and decreased cadence. Upon returning to mat to sit, pt demonstrated anterior LOB requiring heavy min A to correct. 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 Offered seated exercises, but pt declined and requested to return to room. Ambulated back to room, removed shoes with max A, and transferred sit<>supine mod I. Concluded session with pt semi-reclined in bed, needs within reach, and bed alarm on. 20 minutes missed of skilled physical therapy due to fatigue and unwillingness to participate.   Therapy Documentation Precautions:  Precautions Precautions: Fall Precaution Comments: R Hemi Restrictions Weight Bearing Restrictions Per Provider Order: No  Therapy/Group: Individual Therapy Marlana Salvage Zaunegger Blima Rich PT, DPT 08/23/2023, 6:48 AM

## 2023-08-24 DIAGNOSIS — A419 Sepsis, unspecified organism: Secondary | ICD-10-CM | POA: Diagnosis not present

## 2023-08-24 DIAGNOSIS — R2681 Unsteadiness on feet: Secondary | ICD-10-CM | POA: Diagnosis not present

## 2023-08-24 DIAGNOSIS — Z741 Need for assistance with personal care: Secondary | ICD-10-CM | POA: Diagnosis not present

## 2023-08-24 DIAGNOSIS — E119 Type 2 diabetes mellitus without complications: Secondary | ICD-10-CM | POA: Diagnosis not present

## 2023-08-24 DIAGNOSIS — M6259 Muscle wasting and atrophy, not elsewhere classified, multiple sites: Secondary | ICD-10-CM | POA: Diagnosis not present

## 2023-08-24 DIAGNOSIS — R41841 Cognitive communication deficit: Secondary | ICD-10-CM | POA: Diagnosis not present

## 2023-08-24 DIAGNOSIS — Z794 Long term (current) use of insulin: Secondary | ICD-10-CM | POA: Diagnosis not present

## 2023-08-24 DIAGNOSIS — I38 Endocarditis, valve unspecified: Secondary | ICD-10-CM | POA: Diagnosis not present

## 2023-08-24 DIAGNOSIS — I330 Acute and subacute infective endocarditis: Secondary | ICD-10-CM | POA: Diagnosis not present

## 2023-08-24 DIAGNOSIS — Z7401 Bed confinement status: Secondary | ICD-10-CM | POA: Diagnosis not present

## 2023-08-24 DIAGNOSIS — I639 Cerebral infarction, unspecified: Secondary | ICD-10-CM | POA: Diagnosis not present

## 2023-08-24 DIAGNOSIS — R1312 Dysphagia, oropharyngeal phase: Secondary | ICD-10-CM | POA: Diagnosis not present

## 2023-08-24 DIAGNOSIS — I10 Essential (primary) hypertension: Secondary | ICD-10-CM | POA: Diagnosis not present

## 2023-08-24 DIAGNOSIS — I6381 Other cerebral infarction due to occlusion or stenosis of small artery: Secondary | ICD-10-CM | POA: Diagnosis not present

## 2023-08-24 DIAGNOSIS — Z7984 Long term (current) use of oral hypoglycemic drugs: Secondary | ICD-10-CM | POA: Diagnosis not present

## 2023-08-24 DIAGNOSIS — M6281 Muscle weakness (generalized): Secondary | ICD-10-CM | POA: Diagnosis not present

## 2023-08-24 DIAGNOSIS — E785 Hyperlipidemia, unspecified: Secondary | ICD-10-CM | POA: Diagnosis not present

## 2023-08-24 LAB — BASIC METABOLIC PANEL
Anion gap: 6 (ref 5–15)
BUN: 18 mg/dL (ref 8–23)
CO2: 26 mmol/L (ref 22–32)
Calcium: 8.2 mg/dL — ABNORMAL LOW (ref 8.9–10.3)
Chloride: 104 mmol/L (ref 98–111)
Creatinine, Ser: 0.99 mg/dL (ref 0.61–1.24)
GFR, Estimated: >60 mL/min (ref >60)
Glucose, Bld: 80 mg/dL (ref 70–99)
Potassium: 3.4 mmol/L — ABNORMAL LOW (ref 3.5–5.1)
Sodium: 136 mmol/L (ref 135–145)

## 2023-08-24 LAB — GLUCOSE, CAPILLARY: Glucose-Capillary: 113 mg/dL — ABNORMAL HIGH (ref 70–99)

## 2023-08-24 MED ORDER — TIMOLOL MALEATE 0.5 % OP SOLN: 1.0000 [drp] | Freq: Every day | OPHTHALMIC | Status: AC

## 2023-08-24 MED ORDER — B COMPLEX-C PO TABS: 1.0000 | ORAL_TABLET | Freq: Every day | ORAL | Status: DC

## 2023-08-24 MED ORDER — ENOXAPARIN SODIUM 40 MG/0.4ML IJ SOSY: 40.0000 mg | PREFILLED_SYRINGE | INTRAMUSCULAR | Status: DC

## 2023-08-24 MED ORDER — POTASSIUM CHLORIDE 20 MEQ PO PACK
40.0000 meq | PACK | Freq: Once | ORAL | Status: AC
  Administered 2023-08-24: 40 meq via ORAL
  Filled 2023-08-24: qty 2

## 2023-08-24 MED ORDER — VANCOMYCIN IV (FOR PTA / DISCHARGE USE ONLY): 750.0000 mg | Freq: Two times a day (BID) | INTRAVENOUS | 0 refills | Status: DC

## 2023-08-24 MED ORDER — MEROPENEM IV (FOR PTA / DISCHARGE USE ONLY): 2.0000 g | Freq: Three times a day (TID) | INTRAVENOUS | 0 refills | Status: DC

## 2023-08-24 MED ORDER — CHLORHEXIDINE GLUCONATE CLOTH 2 % EX PADS: 6.0000 | MEDICATED_PAD | Freq: Two times a day (BID) | CUTANEOUS | Status: DC

## 2023-08-24 MED ORDER — VITAMIN D3 25 MCG PO TABS: 3000.0000 [IU] | ORAL_TABLET | Freq: Every day | ORAL | Status: DC

## 2023-08-24 MED ORDER — ARTIFICIAL TEARS OPHTHALMIC OINT: TOPICAL_OINTMENT | Freq: Two times a day (BID) | OPHTHALMIC | Status: DC

## 2023-08-24 MED ORDER — INSULIN GLARGINE-YFGN 100 UNIT/ML ~~LOC~~ SOLN: 5.0000 [IU] | Freq: Every day | SUBCUTANEOUS | Status: DC

## 2023-08-24 MED ORDER — HYDRALAZINE HCL 10 MG PO TABS: 10.0000 mg | ORAL_TABLET | Freq: Three times a day (TID) | ORAL | Status: DC

## 2023-08-24 MED ORDER — MELATONIN 5 MG PO TABS: 5.0000 mg | ORAL_TABLET | Freq: Every day | ORAL | Status: DC

## 2023-08-24 MED ORDER — VANCOMYCIN HCL 750 MG/150ML IV SOLN: 750.0000 mg | Freq: Two times a day (BID) | INTRAVENOUS | Status: DC

## 2023-08-24 MED ORDER — EMPAGLIFLOZIN 25 MG PO TABS: 25.0000 mg | ORAL_TABLET | Freq: Every day | ORAL | Status: DC

## 2023-08-24 MED ORDER — HYDROCORTISONE 1 % EX CREA: TOPICAL_CREAM | Freq: Two times a day (BID) | CUTANEOUS | Status: DC

## 2023-08-24 MED ORDER — HEPARIN SOD (PORK) LOCK FLUSH 100 UNIT/ML IV SOLN
500.0000 [IU] | Freq: Once | INTRAVENOUS | Status: AC
  Administered 2023-08-24: 250 [IU] via INTRAVENOUS
  Filled 2023-08-24: qty 5

## 2023-08-24 MED ORDER — MAGNESIUM GLUCONATE 500 MG PO TABS: 250.0000 mg | ORAL_TABLET | Freq: Every day | ORAL | Status: DC

## 2023-08-24 MED ORDER — LISINOPRIL 10 MG PO TABS: 10.0000 mg | ORAL_TABLET | Freq: Every day | ORAL | Status: DC

## 2023-08-24 MED ORDER — ACETAMINOPHEN 325 MG PO TABS: 325.0000 mg | ORAL_TABLET | ORAL | Status: DC | PRN

## 2023-08-24 MED ORDER — SODIUM CHLORIDE 0.9 % IV SOLN: 2.0000 g | Freq: Three times a day (TID) | INTRAVENOUS | Status: DC

## 2023-08-24 NOTE — Plan of Care (Signed)
  Problem: RH Bathing Goal: LTG Patient will bathe all body parts with assist levels (OT) Description: LTG: Patient will bathe all body parts with assist levels (OT) Outcome: Not Met (add Reason) Note: Pt requires supervision to CGA during bathing to ensure safety 2/2 functional and cognitive deficits.    Problem: RH Dressing Goal: LTG Patient will perform lower body dressing w/assist (OT) Description: LTG: Patient will perform lower body dressing with assist, with/without cues in positioning using equipment (OT) Outcome: Not Met (add Reason) Note: Pt requires supervision to ensure safety during task d/t balance deficits.    Problem: RH Grooming Goal: LTG Patient will perform grooming w/assist,cues/equip (OT) Description: LTG: Patient will perform grooming with assist, with/without cues using equipment (OT) Outcome: Completed/Met   Problem: RH Dressing Goal: LTG Patient will perform upper body dressing (OT) Description: LTG Patient will perform upper body dressing with assist, with/without cues (OT). Outcome: Completed/Met   Problem: RH Toileting Goal: LTG Patient will perform toileting task (3/3 steps) with assistance level (OT) Description: LTG: Patient will perform toileting task (3/3 steps) with assistance level (OT)  Outcome: Completed/Met   Problem: RH Functional Use of Upper Extremity Goal: LTG Patient will use RT/LT upper extremity as a (OT) Description: LTG: Patient will use right/left upper extremity as a stabilizer/gross assist/diminished/nondominant/dominant level with assist, with/without cues during functional activity (OT) Outcome: Completed/Met   Problem: RH Toilet Transfers Goal: LTG Patient will perform toilet transfers w/assist (OT) Description: LTG: Patient will perform toilet transfers with assist, with/without cues using equipment (OT) Outcome: Completed/Met   Problem: RH Tub/Shower Transfers Goal: LTG Patient will perform tub/shower transfers w/assist  (OT) Description: LTG: Patient will perform tub/shower transfers with assist, with/without cues using equipment (OT) Outcome: Completed/Met   Problem: RH Pre-functional/Other (Specify) Goal: RH LTG OT (Specify) 1 Description: RH LTG OT (Specify) 1 Outcome: Completed/Met

## 2023-08-24 NOTE — Progress Notes (Addendum)
Patient ID: Jeremy Valencia, male   DOB: Aug 22, 1941, 82 y.o.   MRN: [id number]  Patient set to d/c to SNF today transport arranged for 11 AM. Packet started, waiting on d/c summary.   Spouse made aware. Spouse plans to meet patient at facility between 11:30 AM- 12:00 PM. Spouse would like to ensure patient belongings are packed. Patient has 2 razors at bedside, glasses on over bed table, a jacket hangings up for transport and a change of clothes for transport. Sw will make nurse aware.

## 2023-08-24 NOTE — Progress Notes (Signed)
PROGRESS NOTE   Subjective/Complaints: No new complaints this morning Feels ready to go to SNF No issues overnight Vitals stable  ROS:  Pt denies fever, chills, SOB, abd pain, CP, N/V/C/D, and vision changes  Except for as below +focal weakness, insomnia  +pruritic rash- much improved, +right wrist swelling and pain-stable  Objective:   No results found.  No results for input(s): "WBC", "HGB", "HCT", "PLT" in the last 72 hours.   Recent Labs    08/24/23 0202  NA 136  K 3.4*  CL 104  CO2 26  GLUCOSE 80  BUN 18  CREATININE 0.99  CALCIUM 8.2*    Intake/Output Summary (Last 24 hours) at 08/24/2023 0959 Last data filed at 08/23/2023 1814 Gross per 24 hour  Intake 460 ml  Output --  Net 460 ml        Physical Exam: Vital Signs Blood pressure 129/73, pulse 79, temperature 97.8 F (36.6 C), resp. rate 16, height 6\' 1"  (1.854 m), weight 86.9 kg, SpO2 96%. General: awake, alert, sitting in bed HENT: conjugate gaze; oropharynx moist CV: regular rate and rhythm; no JVD Pulmonary: CTA B/L; nonlabored breathing, on room air GI: soft, NT, ND, (+)BS Psychiatric: appropriate Neurological: Alert and awake GU:  +catheter, draining clear urine.  Skin: + Left PICC line, intact. Diffuse, flat erythematous rash along the Rash much improved          MSK:      No apparent deformity.  Mild swelling in right hand.       Neurologic exam:  Cognition: AAO to person, place, time and event.  Follows all simple commands, difficulty with higher-level cognitive tasks. Language: Fluent, No substitutions or neoglisms. No dysarthria. Names 3/3 objects correctly.  Memory: Recalls 0/3 objects at 5 minutes.  Insight: Good insight into current condition.  Mood: Pleasant affect, appropriate mood.  Flat sense of humor. Sensation: To light touch intact in BL UEs and LEs  Reflexes: 2+ in BL UE and LEs. Negative Hoffman's and  babinski signs bilaterally.  CN: 2-12 grossly intact.  Coordination: No apparent tremors. No ataxia on LUE FTN, HTS bilaterally.  Spasticity: MAS 0 in all extremities.  Mild flexor tone in right distal fingers. Strength:                RUE: 3+/5 SA, 4/5 EF, 4/5 EE, 2+/5 WE, 3/5 FF, 3/5 FA                 LUE: 5/5 SA, 5/5 EF, 5/5 EE, 5/5 WE, 5/5 FF, 5/5 FA                 RLE: 4/5 HF, 4/5 KE, 4/5 DF, 4/5 EHL, 4/5 PF                 LLE:  5/5 HF, 5/5 KE, 5/5 DF, 5/5 EHL, 5/5 PF, stable 12/19         Assessment/Plan: 1. Functional deficits which require 3+ hours per day of interdisciplinary therapy in a comprehensive inpatient rehab setting. Physiatrist is providing close team supervision and 24 hour management of active medical problems listed below. Physiatrist and rehab team continue to assess barriers  to discharge/monitor patient progress toward functional and medical goals  Care Tool:  Bathing    Body parts bathed by patient: Right arm, Left arm, Chest, Abdomen, Front perineal area, Face, Left lower leg, Right lower leg, Right upper leg, Left upper leg, Buttocks   Body parts bathed by helper: Buttocks, Right upper leg, Left upper leg, Right lower leg, Left lower leg     Bathing assist Assist Level: Contact Guard/Touching assist     Upper Body Dressing/Undressing Upper body dressing   What is the patient wearing?: Pull over shirt    Upper body assist Assist Level: Supervision/Verbal cueing    Lower Body Dressing/Undressing Lower body dressing      What is the patient wearing?: Incontinence brief, Pants     Lower body assist Assist for lower body dressing: Contact Guard/Touching assist     Toileting Toileting    Toileting assist Assist for toileting: Minimal Assistance - Patient > 75%     Transfers Chair/bed transfer  Transfers assist     Chair/bed transfer assist level: Supervision/Verbal cueing     Locomotion Ambulation   Ambulation assist       Assist level: Supervision/Verbal cueing Assistive device: Walker-rolling Max distance: 182ft   Walk 10 feet activity   Assist     Assist level: Supervision/Verbal cueing Assistive device: Walker-rolling   Walk 50 feet activity   Assist Walk 50 feet with 2 turns activity did not occur: Safety/medical concerns  Assist level: Supervision/Verbal cueing Assistive device: Walker-rolling    Walk 150 feet activity   Assist Walk 150 feet activity did not occur: Safety/medical concerns  Assist level: Supervision/Verbal cueing Assistive device: Walker-rolling    Walk 10 feet on uneven surface  activity   Assist Walk 10 feet on uneven surfaces activity did not occur: Safety/medical concerns   Assist level: Supervision/Verbal cueing Assistive device: Walker-rolling   Wheelchair     Assist Is the patient using a wheelchair?: Yes Type of Wheelchair: Manual    Wheelchair assist level: Dependent - Patient 0% Max wheelchair distance: >132ft    Wheelchair 50 feet with 2 turns activity    Assist        Assist Level: Dependent - Patient 0%   Wheelchair 150 feet activity     Assist      Assist Level: Dependent - Patient 0%   Blood pressure 129/73, pulse 79, temperature 97.8 F (36.6 C), resp. rate 16, height 6\' 1"  (1.854 m), weight 86.9 kg, SpO2 96%.  Medical Problem List and Plan: 1. 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  Vitamin D and B supplements started D/c to SNF  2.  Antithrombotics: -DVT/anticoagulation:  Pharmaceutical: continue Lovenox             -antiplatelet therapy: continue Aspirin and Plavix indefinitely (cox-2 inhibitor allergy but has tolerated aspirin in the past)   3. 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 Mood/Behavior/Sleep: LCSW to evaluate  and provide emotional support             -antipsychotic agents: n/a  -continue trazodone 25 to 50 mg as needed for insomnia   12/14- 12/15 slept well  12/17 continue trazodone, as needed for now.  Could consider scheduling if sleep continues to be an issue 5. Neuropsych/cognition: This patient is capable of making decisions on his own behalf.   6. Skin/Wound Care: Routine skin care checks   7. Fluids/Electrolytes/Nutrition: Routine Is and Os and follow-up chemistries             -dysphagia 3 diet/thin liquids             -SLP eval   8: Hypertension: monitor TID and prn (home hydrochlorothiazide 25 mg BID held)             -continue hydralazine 25 mg TID             -decrease lisinopril to 10mg   -magnesium gluconate 250mg  started HS  -12/9 blood pressure controlled, continue to monitor  12/14- BP elevated this AM at 160's however has been running in 130s- cont' regimen and monitor trend  12/15- BP running 100's to 1 value of 142 systolic- con't regimen- won't make changes, since better than yesterday  12/17 BP well-controlled continue current regimen    08/24/2023    5:37 AM 08/23/2023    7:45 PM 08/23/2023    3:21 PM  Vitals with BMI  Systolic 129 105 161  Diastolic 73 58 79  Pulse 79 83 80      9: Hyperlipidemia: continue statin   10: Metabolic encephalopathy in the setting of sepsis, with possible cefepime neurotoxicity as a contributing factor: see #15   11: DM: CBGs QID, A1c = 7.3% (home regimen: glimepiride 4 mg q AM, Jardiance 10 mg daily)             -continue SSI             -continue Semglee 5 units q HS             -monitor intake and consider restarting home meds  Increase Jardiance to 25mg  12/17 restart glimepiride at 1 mg tomorrow   CBG (last 3)  Recent Labs    08/23/23 1643  08/23/23 2056 08/24/23 0547  GLUCAP 120* 197* 113*      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 Meropenem to start 12/13   WBC reviewed and normalized  16: Mildly elevated Bun: ensure adequate PO liquids and check BMP weekly   17. Overweight: d/c Ensure  18. Urinary incontinence. PVRs d/ced since not retaining significant amounts  19.  History of restless leg syndrome.  Patient denies symptoms.  continue to monitor for now  20. Rash: concerning for drug rash, will consult ID regarding whether antibiotics can be changed, hydrocortisone ordered, ceftriaxone d/ced as per ID recommendations, rash assessed and improved, meropenem to be started 12/13.   21. Hypotension: decrease hydralazine to 10mg  TID, continue this dose, resolved   22. Right wrist swelling: XR ordered, discussed findings of degeneration, continue wrist brace      LOS: 13 days A FACE TO FACE EVALUATION WAS PERFORMED  Clint Bolder P  Eliab Closson 08/24/2023, 9:59 AM

## 2023-08-24 NOTE — Progress Notes (Signed)
Inpatient Rehabilitation Care Coordinator Discharge Note   Patient Details  Name: Jeremy Valencia MRN: [id number] Date of Birth: [birth year]   Discharge location: [facility]  Length of Stay: 13 Days  Discharge activity level: Sup  Home/community participation: Spouse and children  Patient response WU:[id number] Literacy - How often do you need to have someone help you when you read instructions, pamphlets, or other written material from your doctor or pharmacy?: Always  Patient response YN:WGNFAO Isolation - How often do you feel lonely or isolated from those around you?: Never  Services provided included: SW, Neuropsych, Pharmacy, TR, CM, RN, SLP, OT, PT, RD, MD  Financial Services:  Financial Services Utilized: Private Insurance [employer]  Choices offered to/list presented to: spouse and patient  Follow-up services arranged:              Patient response to transportation need: Is the patient able to respond to transportation needs?: Yes       Patient/Family verbalized understanding of follow-up arrangements:  Yes  Individual responsible for coordination of the follow-up plan: spouse  Confirmed correct DME delivered: Andria Rhein 08/24/2023    Comments (or additional information):  Summary of Stay    Date/Time Discharge Planning CSW  08/22/23 1444 SNF authorization pending CJB  08/15/23 1618 Discharging home with assist from spouse and daughter. 2 steps to enter. Patient has a RW. CJB       Andria Rhein

## 2023-08-24 NOTE — Progress Notes (Signed)
Occupational Therapy Discharge Summary  Patient Details  Name: Jeremy Valencia MRN: [id number] Date of Birth: 01-06-1941  Date of Discharge from OT service:August 24, 2023  Patient has met 7 of 9 long term goals due to improved activity tolerance, improved balance, postural control, ability to compensate for deficits, functional use of  RIGHT upper and RIGHT lower extremity, improved attention, improved awareness, and improved coordination.  Patient to discharge at [facility] Supervision-Min Assist level.  Patient's care partner requires assistance to provide the necessary physical and cognitive assistance at discharge, therefore Pt's will be discharging to SNF in order to receive the necessary level of assistance he needs.   Reasons goals not met: Pt did not meet goals for LB dressing or bathing as pt requires close supervision to CGA for these ADLs to ensure safety 2/2 his cognitive and physical deficits. See POC note for details.   Recommendation:  Patient will benefit from ongoing skilled OT services in skilled nursing facility setting to continue to advance functional skills in the area of BADL and Reduce care partner burden.  Equipment: No equipment provided  Reasons for discharge: treatment goals met and discharge from hospital  Patient/family agrees with progress made and goals achieved: Yes  OT Discharge Precautions/Restrictions  Precautions Precautions: Fall Precaution Comments: R Hemi Restrictions Weight Bearing Restrictions Per Provider Order: No Pain Pain Assessment Pain Scale: 0-10 Pain Score: 0-No pain ADL ADL Eating: Set up Where Assessed-Eating: Wheelchair Grooming: Setup Where Assessed-Grooming: Sitting at sink Upper Body Bathing: Supervision/safety Where Assessed-Upper Body Bathing: Shower Lower Body Bathing: Contact guard, Supervision/safety Where Assessed-Lower Body Bathing: Shower Upper Body Dressing: Setup Where Assessed-Upper Body Dressing:  Wheelchair Lower Body Dressing: Supervision/safety Where Assessed-Lower Body Dressing: Edge of bed Toileting: Supervision/safety Where Assessed-Toileting: Teacher, adult education: Close supervision Toilet Transfer Method: Stand pivot, Proofreader: Engineer, technical sales: Not assessed Film/video editor: Close supervision Film/video editor Method: Designer, industrial/product: Grab bars, Sales promotion account executive Baseline Vision/History: 1 Wears glasses Patient Visual Report: No change from baseline Vision Assessment?: No apparent visual deficits Perception  Perception: Impaired Perception-Other Comments: Mild R inattention, improvement from initial eval Praxis Praxis: Impaired Praxis Impairment Details: Motor planning Cognition Cognition Overall Cognitive Status: Impaired/Different from baseline Arousal/Alertness: Awake/alert Memory: Impaired Awareness: Impaired Problem Solving: Impaired Safety/Judgment: Impaired Comments: decreased insight into deficits Sensation Sensation Light Touch: Impaired Detail Peripheral sensation comments: R forefoot only Light Touch Impaired Details: Impaired RLE;Impaired LLE Hot/Cold: Appears Intact Proprioception: Impaired by gross assessment Stereognosis: Not tested Additional Comments: pt reports neuropathy in bilateral feet Coordination Gross Motor Movements are Fluid and Coordinated: No Fine Motor Movements are Fluid and Coordinated: No Coordination and Movement Description: mild R hemi with R inattention, decreased standing balance/coordination Finger Nose Finger Test: dysmetria on RUE and slow/uncoordinated on LUE Heel Shin Test: decreased ROM bilaterally and slow Motor  Motor Motor - Discharge Observations: R hemi, improvement from inital eval Mobility  Bed Mobility Bed Mobility: Rolling Right;Rolling Left;Sit to Supine;Supine to Sit Rolling Right: Independent with assistive  device Rolling Left: Independent with assistive device Supine to Sit: Independent with assistive device Sit to Supine: Independent with assistive device Transfers Sit to Stand: Supervision/Verbal cueing Stand to Sit: Supervision/Verbal cueing  Trunk/Postural Assessment  Cervical Assessment Cervical Assessment: Exceptions to [facility] (forward head) Thoracic Assessment Thoracic Assessment: Exceptions to [facility] (thoracic rounding) Lumbar Assessment Lumbar Assessment: Exceptions to [facility] (posterior pelvic tilt) Postural Control Postural Control: Deficits on evaluation Protective Responses: slowed and delayed - but improved  since eval  Balance Balance Balance Assessed: Yes Static Sitting Balance Static Sitting - Balance Support: Feet supported;Bilateral upper extremity supported Static Sitting - Level of Assistance: 7: Independent Dynamic Sitting Balance Dynamic Sitting - Balance Support: Feet supported;No upper extremity supported Dynamic Sitting - Level of Assistance: 6: Modified independent (Device/Increase time) Static Standing Balance Static Standing - Balance Support: Bilateral upper extremity supported;During functional activity Static Standing - Level of Assistance: 5: Stand by assistance Dynamic Standing Balance Dynamic Standing - Balance Support: Bilateral upper extremity supported;During functional activity Dynamic Standing - Level of Assistance: 5: Stand by assistance Extremity/Trunk Assessment RUE Assessment RUE Assessment: Exceptions to [facility] Passive Range of Motion (PROM) Comments: shoulder flexion 75% at baseline Active Range of Motion (AROM) Comments: AROM limited to 75% at baseline, elbow and hand able to move through full AROM General Strength Comments: Shoulder flexion/abduction/er: 3-/5, elbow flexion: 4/5, elbow extension: 4-/5, wrist extension: 3+/5, wrist flexion: 4-/5, wrist supination/pronation: 3+/5, Able to demonstrate full composite finger flexion/extension with  decreased gross grasp. LUE Assessment LUE Assessment: Within Functional Limits Active Range of Motion (AROM) Comments: WNL in all ranges General Strength Comments: 5/5 grossly   [name] 08/24/2023, 4:44 PM

## 2023-08-24 NOTE — Progress Notes (Signed)
Inpatient Rehabilitation Discharge Medication Review by a Pharmacist ma  A complete drug regimen review was completed for this patient to identify any potential clinically significant medication issues.   High Risk Drug Classes Is patient taking? Indication by Medication  Antipsychotic No    Anticoagulant Yes Enoxaparin - VTE prophylaxis  Antibiotic Yes, as an intravenous medication Meropenem, Vancomycin - culture negative endocarditis, EOT 09/12/2023  Opioid No    Antiplatelet Yes bASA, clopidogrel - acute stroke  Hypoglycemics/insulin Yes Insulin Semglee, glimepiride, jardiance- T2DM  Vasoactive Medication Yes Hydralazine, lisinopril - HTN  Chemotherapy No    Other Yes Atorvastatin - HLD Magnesium gluconate-supplement/HTN Melatonin - sleep Vitamin B complex  Acetaminophen-pain Artificial tears eye drops- for dry eyes Timolol eye drops-glaucoma Vitamin D - supplement        Type of Medication Issue Identified Description of Issue Recommendation(s)  Drug Interaction(s) (clinically significant)        Duplicate Therapy        Allergy        No Medication Administration End Date        Incorrect Dose        Additional Drug Therapy Needed        Significant med changes from prior encounter (inform family/care partners about these prior to discharge).      Other   PTA meds :gabapentin, fish oil, colchicine discontinued.       Clinically significant medication issues were identified that warrant physician communication and completion of prescribed/recommended actions by midnight of the next day:  No   Name of provider notified for urgent issues identified:    Provider Method of Notification:    Pharmacist comments:    Time spent performing this drug regimen review (minutes): 25  Noah Delaine, RPh Clinical Pharmacist 08/24/2023 11:37 AM

## 2023-08-24 NOTE — Progress Notes (Signed)
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  Labs - Thursday:  BMP and vancomycin trough  Fax weekly lab results  promptly to [phone]  Please pull PIC at completion of IV antibiotics   IV antibiotic discharge orders are pended. To discharging provider:  please sign these orders via discharge navigator,  Select New Orders & click on the button choice - Manage This Unsigned Work.     Thank you for allowing pharmacy to be a part of this patient's care.  Juliette Alcide, PharmD, BCPS, BCIDP Work Cell: [phone] 08/24/2023 11:01 AM

## 2023-08-25 DIAGNOSIS — I6381 Other cerebral infarction due to occlusion or stenosis of small artery: Secondary | ICD-10-CM | POA: Diagnosis not present

## 2023-08-25 DIAGNOSIS — I10 Essential (primary) hypertension: Secondary | ICD-10-CM | POA: Diagnosis not present

## 2023-08-25 DIAGNOSIS — E119 Type 2 diabetes mellitus without complications: Secondary | ICD-10-CM | POA: Diagnosis not present

## 2023-08-25 DIAGNOSIS — I330 Acute and subacute infective endocarditis: Secondary | ICD-10-CM | POA: Diagnosis not present

## 2023-08-25 DIAGNOSIS — M6281 Muscle weakness (generalized): Secondary | ICD-10-CM | POA: Diagnosis not present

## 2023-08-25 DIAGNOSIS — A419 Sepsis, unspecified organism: Secondary | ICD-10-CM | POA: Diagnosis not present

## 2023-08-25 DIAGNOSIS — R2681 Unsteadiness on feet: Secondary | ICD-10-CM | POA: Diagnosis not present

## 2023-08-25 DIAGNOSIS — I38 Endocarditis, valve unspecified: Secondary | ICD-10-CM | POA: Diagnosis not present

## 2023-08-25 NOTE — Plan of Care (Signed)
  Problem: RH Swallowing Goal: LTG Patient will consume least restrictive diet using compensatory strategies with assistance (SLP) Description: LTG:  Patient will consume least restrictive diet using compensatory strategies with assistance (SLP) Outcome: Completed/Met Flowsheets (Taken 08/12/2023 1012 by Sockwell, Cassidi F, CCC-SLP) LTG: Pt Patient will consume least restrictive diet using compensatory strategies with assistance of (SLP): Modified Independent   Problem: RH Problem Solving Goal: LTG Patient will demonstrate problem solving for (SLP) Description: LTG:  Patient will demonstrate problem solving for basic/complex daily situations with cues  (SLP) Outcome: Completed/Met Flowsheets (Taken 08/25/2023 1552) LTG: Patient will demonstrate problem solving for (SLP): Basic daily situations LTG Patient will demonstrate problem solving for: Minimal Assistance - Patient > 75%   Problem: RH Memory Goal: LTG Patient will use memory compensatory aids to (SLP) Description: LTG:  Patient will use memory compensatory aids to recall biographical/new, daily complex information with cues (SLP) Outcome: Completed/Met Flowsheets (Taken 08/12/2023 1012 by Sockwell, Cassidi F, CCC-SLP) LTG: Patient will use memory compensatory aids to (SLP): Minimal Assistance - Patient > 75%   Problem: RH Awareness Goal: LTG: Patient will demonstrate awareness during functional activites type of (SLP) Description: LTG: Patient will demonstrate awareness during functional activites type of (SLP) Outcome: Completed/Met Flowsheets Taken 08/25/2023 1552 by Renaee Munda, CCC-SLP Patient will demonstrate during cognitive/linguistic activities awareness type of: Intellectual Taken 08/12/2023 1012 by Sockwell, Cassidi F, CCC-SLP LTG: Patient will demonstrate awareness during cognitive/linguistic activities with assistance of (SLP): Minimal Assistance - Patient > 75%

## 2023-08-25 NOTE — Progress Notes (Signed)
Inpatient Rehabilitation Care Coordinator Discharge Note   Patient Details  Name: Jeremy Valencia MRN: [id number] Date of Birth: [birth year]   Discharge location: [facility]  Length of Stay: 13 Days  Discharge activity level: Sup  Home/community participation: Spouse and children  Patient response YN:WGNFAO Literacy - How often do you need to have someone help you when you read instructions, pamphlets, or other written material from your doctor or pharmacy?: Always  Patient response ZH:YQMVHQ Isolation - How often do you feel lonely or isolated from those around you?: Never  Services provided included: SW, Neuropsych, Pharmacy, TR, CM, RN, SLP, OT, PT, RD, MD  Financial Services:  Financial Services Utilized: Private Insurance [employer]  Choices offered to/list presented to: spouse and patient  Follow-up services arranged:              Patient response to transportation need: Is the patient able to respond to transportation needs?: Yes In the past 12 months, has lack of transportation kept you from medical appointments or from getting medications?: No In the past 12 months, has lack of transportation kept you from meetings, work, or from getting things needed for daily living?: No   Patient/Family verbalized understanding of follow-up arrangements:  Yes  Individual responsible for coordination of the follow-up plan: spouse  Confirmed correct DME delivered: Andria Rhein 08/25/2023    Comments (or additional information):  Summary of Stay    Date/Time Discharge Planning CSW  08/22/23 1444 SNF authorization pending CJB  08/15/23 1618 Discharging home with assist from spouse and daughter. 2 steps to enter. Patient has a RW. CJB       Andria Rhein

## 2023-08-25 NOTE — Progress Notes (Signed)
Speech Language Pathology Discharge Summary  Patient Details  Name: BRYAM BANDT MRN: [id number] Date of Birth: September 05, 1941  Date of Discharge from SLP service:August 24, 2023  Today's Date: 08/25/2023  Patient has met 4 of 4 long term goals.  Patient to discharge at [facility] level.  Reasons goals not met:     Clinical Impression/Discharge Summary: Pt has made steady progress and has met 4 of 4 LTGs this reporting period due to improved cognition and toleracne of diet. Pt iscurrently an overall min A for awareness, attention, problem solving, and recall. He consumes D3 and benefits from sup A for use of swallowing strategies. Pt/ family education complete and pt will discharge to SNF with 24 hour supervision. Pt would benefit from f/u SLP services to maximize swallowing for possibility of upgrade and for cognition in order to maximize her functional independence.  Care Partner:  Caregiver Able to Provide Assistance: Yes  Type of Caregiver Assistance: Cognitive  Recommendation:  Home Health SLP;24 hour supervision/assistance;Outpatient SLP  Rationale for SLP Follow Up: Maximize cognitive function and independence;Maximize swallowing safety   Equipment: none   Reasons for discharge: Treatment goals met;Discharged from hospital   Patient/Family Agrees with Progress Made and Goals Achieved: Yes    Renaee Munda 08/25/2023, 3:57 PM

## 2023-08-28 DIAGNOSIS — A419 Sepsis, unspecified organism: Secondary | ICD-10-CM | POA: Diagnosis not present

## 2023-08-28 DIAGNOSIS — I10 Essential (primary) hypertension: Secondary | ICD-10-CM | POA: Diagnosis not present

## 2023-08-28 DIAGNOSIS — E119 Type 2 diabetes mellitus without complications: Secondary | ICD-10-CM | POA: Diagnosis not present

## 2023-08-28 DIAGNOSIS — I330 Acute and subacute infective endocarditis: Secondary | ICD-10-CM | POA: Diagnosis not present

## 2023-08-28 DIAGNOSIS — I6381 Other cerebral infarction due to occlusion or stenosis of small artery: Secondary | ICD-10-CM | POA: Diagnosis not present

## 2023-08-28 DIAGNOSIS — I38 Endocarditis, valve unspecified: Secondary | ICD-10-CM | POA: Diagnosis not present

## 2023-08-29 DIAGNOSIS — R2681 Unsteadiness on feet: Secondary | ICD-10-CM | POA: Diagnosis not present

## 2023-08-29 DIAGNOSIS — M6281 Muscle weakness (generalized): Secondary | ICD-10-CM | POA: Diagnosis not present

## 2023-08-29 DIAGNOSIS — I38 Endocarditis, valve unspecified: Secondary | ICD-10-CM | POA: Diagnosis not present

## 2023-08-29 DIAGNOSIS — I6381 Other cerebral infarction due to occlusion or stenosis of small artery: Secondary | ICD-10-CM | POA: Diagnosis not present

## 2023-08-31 ENCOUNTER — Telehealth: Payer: Self-pay

## 2023-08-31 ENCOUNTER — Other Ambulatory Visit: Payer: Self-pay

## 2023-08-31 ENCOUNTER — Ambulatory Visit: Payer: Medicare HMO | Attending: Infectious Diseases | Admitting: Infectious Diseases

## 2023-08-31 VITALS — BP 170/93 | HR 77 | Temp 97.0°F

## 2023-08-31 DIAGNOSIS — E785 Hyperlipidemia, unspecified: Secondary | ICD-10-CM | POA: Insufficient documentation

## 2023-08-31 DIAGNOSIS — I10 Essential (primary) hypertension: Secondary | ICD-10-CM | POA: Insufficient documentation

## 2023-08-31 DIAGNOSIS — A419 Sepsis, unspecified organism: Secondary | ICD-10-CM | POA: Diagnosis not present

## 2023-08-31 DIAGNOSIS — Z7984 Long term (current) use of oral hypoglycemic drugs: Secondary | ICD-10-CM | POA: Diagnosis not present

## 2023-08-31 DIAGNOSIS — Z794 Long term (current) use of insulin: Secondary | ICD-10-CM | POA: Insufficient documentation

## 2023-08-31 DIAGNOSIS — I38 Endocarditis, valve unspecified: Secondary | ICD-10-CM

## 2023-08-31 DIAGNOSIS — I330 Acute and subacute infective endocarditis: Secondary | ICD-10-CM

## 2023-08-31 DIAGNOSIS — E119 Type 2 diabetes mellitus without complications: Secondary | ICD-10-CM | POA: Insufficient documentation

## 2023-08-31 DIAGNOSIS — I639 Cerebral infarction, unspecified: Secondary | ICD-10-CM | POA: Diagnosis not present

## 2023-08-31 DIAGNOSIS — I6381 Other cerebral infarction due to occlusion or stenosis of small artery: Secondary | ICD-10-CM | POA: Diagnosis not present

## 2023-08-31 NOTE — Progress Notes (Signed)
NAME: Jeremy Valencia  DOB: Jan 20, 1941  MRN: [id number]  Date/Time: 08/31/2023 11:46 AM   Subjective:  Here with his son ? Jeremy Valencia is a 82 y.o. 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 The patient was initially on vancomycin and ceftriaxone and was transferred to CIR , but due to an allergic reaction to ceftriaxone, it was switched to meropenem. The  rash, has since improved. The patient is currently on vancomycin and meropenem, with a PICC line in place for the administration of the antibiotics. The patient left [facility] and is now in [facility] The patient's antibiotics are scheduled to finish on the seventh of January. The patient is also undergoing rehabilitation at the center, including physical therapy and speech therapy. The patient reports no weakness and is able to walk, sometimes without the aid of a walker. The patient's appetite and bowel movements are normal.  Past Medical History:  Diagnosis Date   Arthritis    BPH (benign prostatic hyperplasia)    Bursitis 05/16/2013   BURSITIS/CAPSULITIS RT ANKLE   Cancer (HCC)    skin cancer   Capsulitis of right foot 01/21/2013   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    Diabetes mellitus without complication (HCC)    Diverticulosis    Emphysema, unspecified (HCC)    GERD (gastroesophageal reflux disease)    Hyperlipidemia    Hypertension    Plantar fasciitis     Past Surgical History:  Procedure Laterality Date   APPENDECTOMY     BACK SURGERY     CATARACT EXTRACTION Left    COLONOSCOPY     COLONOSCOPY WITH PROPOFOL N/A 04/06/2021   Procedure: COLONOSCOPY WITH PROPOFOL;  Surgeon: Regis Bill, MD;  Location: [facility] ENDOSCOPY;  Service: Endoscopy;  Laterality: N/A;  DM   FOOT SURGERY     LAMINECTOMY      right eye     ROTATOR CUFF REPAIR Right    TEE WITHOUT CARDIOVERSION N/A 08/08/2023   Procedure: TRANSESOPHAGEAL ECHOCARDIOGRAM (TEE);  Surgeon: Antonieta Iba, MD;  Location: [facility] ORS;  Service: Cardiovascular;  Laterality: N/A;   UPPER GI ENDOSCOPY      Social History   Socioeconomic History   Marital status: Married    Spouse name: Not on file   Number of children: Not on file   Years of education: Not on file   Highest education level: Not on file  Occupational History   Not on file  Tobacco Use   Smoking status: Former   Smokeless tobacco: Never   Tobacco comments:    1998  Vaping Use   Vaping status: Never Used  Substance and Sexual Activity   Alcohol use: Yes    Comment: rarely   Drug use: No   Sexual activity: Never  Other Topics Concern   Not on file  Social History Narrative   Not on file   Social Drivers of Health   Financial Resource Strain: Not on file  Food Insecurity: Patient Unable To Answer (08/03/2023)   Hunger Vital Sign    Worried About Running Out of Food in the Last Year: Patient unable to answer    Ran Out of Food in the Last Year: Patient unable to answer  Transportation Needs: Patient Unable To Answer (08/03/2023)   PRAPARE -  Administrator, Civil Service (Medical): Patient unable to answer    Lack of Transportation (Non-Medical): Patient unable to answer  Physical Activity: Not on file  Stress: Not on file  Social Connections: Not on file  Intimate Partner Violence: Patient Unable To Answer (08/03/2023)   Humiliation, Afraid, Rape, and Kick questionnaire    Fear of Current or Ex-Partner: Patient unable to answer    Emotionally Abused: Patient unable to answer    Physically Abused: Patient unable to answer    Sexually Abused: Patient unable to answer    Family History  Problem Relation Age of Onset   Cerebral aneurysm Mother    CAD Father    Parkinson's disease Father    Prostate cancer Father    Allergies  Allergen  Reactions   Metformin Diarrhea   Rofecoxib Other (See Comments)    Other reaction(s): Unknown   Lamisil [Terbinafine] Rash   I? Current Outpatient Medications  Medication Sig Dispense Refill   acetaminophen (TYLENOL) 325 MG tablet Take 1-2 tablets (325-650 mg total) by mouth every 4 (four) hours as needed for mild pain (pain score 1-3).     artificial tears (LACRILUBE) OINT ophthalmic ointment Place into the right eye 2 (two) times daily.     aspirin 81 MG tablet Take 81 mg by mouth daily.     atorvastatin (LIPITOR) 20 MG tablet Take 20 mg by mouth daily.     B Complex-C (B-COMPLEX WITH VITAMIN C) tablet Take 1 tablet by mouth daily.     Chlorhexidine Gluconate Cloth 2 % PADS Apply 6 each topically every 12 (twelve) hours.     cholecalciferol (CHOLECALCIFEROL) 25 MCG tablet Take 3 tablets (3,000 Units total) by mouth daily.     clopidogrel (PLAVIX) 75 MG tablet Take 1 tablet (75 mg total) by mouth daily.     empagliflozin (JARDIANCE) 25 MG TABS tablet Take 1 tablet (25 mg total) by mouth daily.     enoxaparin (LOVENOX) 40 MG/0.4ML injection Inject 0.4 mLs (40 mg total) into the skin daily.     glimepiride (AMARYL) 4 MG tablet Take 4 mg by mouth every morning.     hydrALAZINE (APRESOLINE) 10 MG tablet Take 1 tablet (10 mg total) by mouth every 8 (eight) hours.     hydrocortisone cream 1 % Apply topically 2 (two) times daily.     insulin glargine-yfgn (SEMGLEE) 100 UNIT/ML injection Inject 0.05 mLs (5 Units total) into the skin daily.     lisinopril (ZESTRIL) 10 MG tablet Take 1 tablet (10 mg total) by mouth daily.     magnesium gluconate (MAGONATE) 500 MG tablet Take 0.5 tablets (250 mg total) by mouth at bedtime.     melatonin 5 MG TABS Take 1 tablet (5 mg total) by mouth at bedtime.     meropenem (MERREM) IVPB Inject 2 g into the vein every 8 (eight) hours for 19 days. 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 Labs - Thursday:  BMP and vancomycin trough Fax weekly lab results  promptly to [phone] Please pull PICC at completion of IV antibiotics Method of administration: Mini-Bag Plus / Gravity Method of administration may be changed at the discretion of facility and its pharmacy 57 Units 0   timolol (TIMOPTIC) 0.5 % ophthalmic solution Place 1 drop into the left eye daily.     vancomycin IVPB Inject 750 mg  into the vein every 12 (twelve) hours for 19 days. 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 Labs - Thursday:  BMP and vancomycin trough Fax weekly lab results  promptly to [phone] Please pull PIC at completion of IV antibiotics Method of administration:Elastomeric Method of administration may be changed at the discretion of the facility and its pharmacy 38 Units 0   No current facility-administered medications for this visit.     Abtx:  Anti-infectives (From admission, onward)    None       REVIEW OF SYSTEMS:  Const: negative fever, negative chills, negative weight loss Eyes: negative diplopia or visual changes, negative eye pain ENT: negative coryza, negative sore throat Resp: negative cough, hemoptysis, dyspnea Cards: negative for chest pain, palpitations, lower extremity edema GU: negative for frequency, dysuria and hematuria GI: Negative for abdominal pain, diarrhea, bleeding, constipation Skin: negative for rash and pruritus Heme: negative for easy bruising and gum/nose bleeding MS: negative for myalgias, arthralgias, back pain and muscle weakness Neurolo:negative for headaches, dizziness, vertigo, memory problems  Psych: negative for feelings of anxiety, depression  Endocrine: negative for thyroid, diabetes Allergy/Immunology- negative for any medication or food allergies ? Pertinent Positives include : Objective:   VITALS:  BP (!) 170/93   Pulse 77   Temp (!) 97 F (36.1 C) (Temporal)   SpO2 94%  LDA PICcC PHYSICAL EXAM:  General: Alert, cooperative, no distress, appears stated age. In wheel chair Head: Normocephalic, without obvious abnormality, atraumatic. Eyes: rt eye prosthesisl ENT Nares normal. No drainage or sinus tenderness. Lips, mucosa, and tongue normal. No Thrush Neck: Supple, symmetrical, no adenopathy, thyroid: non tender no carotid bruit and no JVD. Back: No CVA tenderness. Lungs: Clear to auscultation bilaterally. No Wheezing or Rhonchi. No rales. Heart: Regular rate and rhythm, no murmur, rub or gallop. Abdomen: Soft,  Extremities: atraumatic, no cyanosis. No edema. No clubbing Skin: No rashes or lesions. Or bruising Lymph: Cervical, supraclavicular normal. Neurologic: Grossly non-focal Pertinent Labs Lab Results CBC    Component Value Date/Time   WBC 8.2 08/21/2023 0214   RBC 4.50 08/21/2023 0214   HGB 14.7 08/21/2023 0214   HCT 43.8 08/21/2023 0214   PLT 311 08/21/2023 0214   MCV 97.3 08/21/2023 0214   MCH 32.7 08/21/2023 0214   MCHC 33.6 08/21/2023 0214   RDW 11.7 08/21/2023 0214   LYMPHSABS 1.5 08/21/2023 0214   MONOABS 1.1 (H) 08/21/2023 0214   EOSABS 0.6 (H) 08/21/2023 0214   BASOSABS 0.1 08/21/2023 0214       Latest Ref Rng & Units 08/24/2023    2:02 AM 08/21/2023    2:14 AM 08/17/2023   11:17 AM  CMP  Glucose 70 - 99 mg/dL 80  638  756   BUN 8 - 23 mg/dL 18  16  18    Creatinine 0.61 - 1.24 mg/dL 4.33  2.95  1.88   Sodium 135 - 145 mmol/L 136  135  138   Potassium 3.5 - 5.1 mmol/L 3.4  3.6  3.9   Chloride 98 - 111 mmol/L 104  101  102   CO2 22 - 32 mmol/L 26  25  25    Calcium 8.9 - 10.3 mg/dL 8.2  8.4  8.6   Total Protein 6.5 - 8.1 g/dL  5.7    Total Bilirubin <1.2 mg/dL  0.9    Alkaline Phos 38 - 126 U/L  49    AST 15 - 41 U/L  19    ALT 0 - 44 U/L  21        ? 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 Patient had a stroke with clots in the brain. Currently on antibiotics (Vancomycin and Meropenem) after an allergic reaction to Ceftriaxone. -Continue Vancomycin and Meropenem until September 12, 2023. -Reduce Meropenem dose from 2g to 1g every 8 hours. -Order 2D echocardiogram to assess vegetation size. -Follow up with Dr. Mariah Milling, the cardiologist, for repeat  echocardiogram and further management.  Embolic stroke- improved significantly  Rash Likely allergic reaction to Ceftriaxone, now resolved. -Continue monitoring skin condition, apply moisturizer for dryness and itching.  General Health Maintenance / Followup Plans -Remove PICC line after completion of antibiotics on September 12, 2023. -Follow up appointment with Dr. Lewie Loron for echocardiogram results and further management. ? _____________ Discussed with patient, and his son Discussed with Dr.Gollan

## 2023-08-31 NOTE — Patient Instructions (Signed)
You have been receiving treatment for a presumed heart valve infection and stroke at [facility]. Your antibiotics were adjusted due to an allergic reaction, and you are currently on vancomycin and meropenem. Your rehabilitation is progressing well, and you are able to walk, sometimes without a walker. Your appetite and bowel movements are normal.  YOUR PLAN:  -CULTURE NEGATIVE ENDOCARDITIS: This is a presumed infection of the mitral valve with vegetation, but tests have not confirmed the infection. You had a stroke with clots in the brain. Continue taking Vancomycin and Meropenem until September 12, 2023. The dose of Meropenem will be  reduced to 1g every 8 hours. A 2D echocardiogram will be ordered to assess the size of the vegetation. Follow up with Dr. Mariah Milling, the cardiologist, for interpretation of the echocardiogram and further management.  -RASH: You had a rash likely due to an allergic reaction to Ceftriaxone, which has now resolved. Continue monitoring your skin condition and apply moisturizer for dryness and itching.  -GENERAL HEALTH MAINTENANCE: Your PICC line will be removed after you complete your antibiotics on September 12, 2023. Follow up with Dr. Lewie Loron for the echocardiogram results and further management.  INSTRUCTIONS:  Continue taking Vancomycin and Meropenem until September 12, 2023, with the Meropenem dose reduced to 1g every 8 hours. A 2D echocardiogram will be ordered to assess the size of the vegetation. Follow up with Dr. Lewie Loron, the cardiologist, for interpretation of the echocardiogram and further management. Your PICC line will be removed after you complete your antibiotics on September 12, 2023.

## 2023-08-31 NOTE — Telephone Encounter (Signed)
Per Dr. Rivka Safer patient's meropenem dose will decrease to 1 gm Q8H and end day 09/12/23. He will continue the vanc until 09/12/23 and picc will be removed after last dose.  Orders sent back with the patient to [facility].  Spoke with [employer] as well and asked if they needed verbal orders for nurse. Advised they will review the orders sent back with the patient and call if they have questions.  Casady Voshell Jonathon Resides, CMA

## 2023-09-01 ENCOUNTER — Encounter: Payer: Self-pay | Admitting: Infectious Diseases

## 2023-09-01 DIAGNOSIS — I10 Essential (primary) hypertension: Secondary | ICD-10-CM | POA: Diagnosis not present

## 2023-09-01 DIAGNOSIS — I38 Endocarditis, valve unspecified: Secondary | ICD-10-CM | POA: Diagnosis not present

## 2023-09-01 DIAGNOSIS — I330 Acute and subacute infective endocarditis: Secondary | ICD-10-CM | POA: Diagnosis not present

## 2023-09-01 DIAGNOSIS — A419 Sepsis, unspecified organism: Secondary | ICD-10-CM | POA: Diagnosis not present

## 2023-09-01 DIAGNOSIS — I6381 Other cerebral infarction due to occlusion or stenosis of small artery: Secondary | ICD-10-CM | POA: Diagnosis not present

## 2023-09-01 DIAGNOSIS — E119 Type 2 diabetes mellitus without complications: Secondary | ICD-10-CM | POA: Diagnosis not present

## 2023-09-04 DIAGNOSIS — I6381 Other cerebral infarction due to occlusion or stenosis of small artery: Secondary | ICD-10-CM | POA: Diagnosis not present

## 2023-09-04 DIAGNOSIS — I10 Essential (primary) hypertension: Secondary | ICD-10-CM | POA: Diagnosis not present

## 2023-09-04 DIAGNOSIS — I38 Endocarditis, valve unspecified: Secondary | ICD-10-CM | POA: Diagnosis not present

## 2023-09-04 DIAGNOSIS — I330 Acute and subacute infective endocarditis: Secondary | ICD-10-CM | POA: Diagnosis not present

## 2023-09-04 DIAGNOSIS — A419 Sepsis, unspecified organism: Secondary | ICD-10-CM | POA: Diagnosis not present

## 2023-09-04 DIAGNOSIS — E119 Type 2 diabetes mellitus without complications: Secondary | ICD-10-CM | POA: Diagnosis not present

## 2023-09-04 LAB — LAB REPORT - SCANNED: EGFR: 84

## 2023-09-05 DIAGNOSIS — I330 Acute and subacute infective endocarditis: Secondary | ICD-10-CM | POA: Diagnosis not present

## 2023-09-05 DIAGNOSIS — A419 Sepsis, unspecified organism: Secondary | ICD-10-CM | POA: Diagnosis not present

## 2023-09-05 DIAGNOSIS — E119 Type 2 diabetes mellitus without complications: Secondary | ICD-10-CM | POA: Diagnosis not present

## 2023-09-05 DIAGNOSIS — I38 Endocarditis, valve unspecified: Secondary | ICD-10-CM | POA: Diagnosis not present

## 2023-09-05 DIAGNOSIS — I6381 Other cerebral infarction due to occlusion or stenosis of small artery: Secondary | ICD-10-CM | POA: Diagnosis not present

## 2023-09-05 DIAGNOSIS — M6281 Muscle weakness (generalized): Secondary | ICD-10-CM | POA: Diagnosis not present

## 2023-09-05 DIAGNOSIS — R2681 Unsteadiness on feet: Secondary | ICD-10-CM | POA: Diagnosis not present

## 2023-09-05 DIAGNOSIS — I10 Essential (primary) hypertension: Secondary | ICD-10-CM | POA: Diagnosis not present

## 2023-09-07 ENCOUNTER — Encounter: Payer: Self-pay | Admitting: Infectious Diseases

## 2023-09-08 ENCOUNTER — Telehealth: Payer: Self-pay | Admitting: Infectious Diseases

## 2023-09-08 NOTE — Telephone Encounter (Signed)
 Per Dr. Rivka Safer ok to discontinue the IV abx.  I have faxed orders to [facility] [phone]). I also gave verbal order to [place], [employer] fund[employer]. Requested patient most recent lab results be faxed to our office as well.  Ajla Mcgeachy Jonathon Resides, CMA

## 2023-09-08 NOTE — Telephone Encounter (Signed)
 Called Patient's so Jeremy Valencia- pt is in [facility] The rash is appearing again on patients back- pictures were sent by family Pt is on IV vancomycin  and Iv meropenem - will complete 42 days on 09/11/22 for culture negative endocarditis Today is day 38- we can stop the antibiotics and remove PICC   No need for oral antibiotics [name] informed [name] regarding the discontinuation and removal of PICC HE will follow up with cardiology for repeat echo/TEE

## 2023-09-11 ENCOUNTER — Encounter: Payer: Self-pay | Admitting: Infectious Diseases

## 2023-09-12 ENCOUNTER — Encounter: Payer: Self-pay | Admitting: Infectious Diseases

## 2023-09-12 ENCOUNTER — Ambulatory Visit: Payer: Medicare Other | Attending: Infectious Diseases | Admitting: Infectious Diseases

## 2023-09-12 ENCOUNTER — Other Ambulatory Visit
Admission: RE | Admit: 2023-09-12 | Discharge: 2023-09-12 | Disposition: A | Discharge status: Home / Self Care | Payer: Medicare Other | Source: Ambulatory Visit | Attending: Infectious Diseases | Admitting: Infectious Diseases

## 2023-09-12 VITALS — BP 119/66 | HR 92 | Temp 97.4°F

## 2023-09-12 DIAGNOSIS — I10 Essential (primary) hypertension: Secondary | ICD-10-CM | POA: Insufficient documentation

## 2023-09-12 DIAGNOSIS — E119 Type 2 diabetes mellitus without complications: Secondary | ICD-10-CM | POA: Insufficient documentation

## 2023-09-12 DIAGNOSIS — Z7984 Long term (current) use of oral hypoglycemic drugs: Secondary | ICD-10-CM | POA: Diagnosis not present

## 2023-09-12 DIAGNOSIS — L270 Generalized skin eruption due to drugs and medicaments taken internally: Secondary | ICD-10-CM | POA: Insufficient documentation

## 2023-09-12 DIAGNOSIS — R21 Rash and other nonspecific skin eruption: Secondary | ICD-10-CM | POA: Diagnosis present

## 2023-09-12 DIAGNOSIS — E785 Hyperlipidemia, unspecified: Secondary | ICD-10-CM | POA: Insufficient documentation

## 2023-09-12 DIAGNOSIS — Z794 Long term (current) use of insulin: Secondary | ICD-10-CM | POA: Insufficient documentation

## 2023-09-12 DIAGNOSIS — Z8673 Personal history of transient ischemic attack (TIA), and cerebral infarction without residual deficits: Secondary | ICD-10-CM | POA: Insufficient documentation

## 2023-09-12 DIAGNOSIS — J439 Emphysema, unspecified: Secondary | ICD-10-CM | POA: Insufficient documentation

## 2023-09-12 LAB — CBC WITH DIFFERENTIAL/PLATELET
Abs Immature Granulocytes: 0.05 10*3/uL (ref 0.00–0.07)
Basophils Absolute: 0.1 10*3/uL (ref 0.0–0.1)
Basophils Relative: 1 %
Eosinophils Absolute: 1.6 10*3/uL — ABNORMAL HIGH (ref 0.0–0.5)
Eosinophils Relative: 14 %
HCT: 47.2 % (ref 39.0–52.0)
Hemoglobin: 15.9 g/dL (ref 13.0–17.0)
Immature Granulocytes: 0 %
Lymphocytes Relative: 8 %
Lymphs Abs: 0.9 10*3/uL (ref 0.7–4.0)
MCH: 32.6 pg (ref 26.0–34.0)
MCHC: 33.7 g/dL (ref 30.0–36.0)
MCV: 96.7 fL (ref 80.0–100.0)
Monocytes Absolute: 1.1 10*3/uL — ABNORMAL HIGH (ref 0.1–1.0)
Monocytes Relative: 10 %
Neutro Abs: 7.6 10*3/uL (ref 1.7–7.7)
Neutrophils Relative %: 67 %
Platelets: 303 10*3/uL (ref 150–400)
RBC: 4.88 MIL/uL (ref 4.22–5.81)
RDW: 12.7 % (ref 11.5–15.5)
WBC: 11.3 10*3/uL — ABNORMAL HIGH (ref 4.0–10.5)
nRBC: 0 % (ref 0.0–0.2)

## 2023-09-12 LAB — COMPREHENSIVE METABOLIC PANEL
ALT: 37 U/L (ref 0–44)
AST: 24 U/L (ref 15–41)
Albumin: 3.2 g/dL — ABNORMAL LOW (ref 3.5–5.0)
Alkaline Phosphatase: 65 U/L (ref 38–126)
Anion gap: 13 (ref 5–15)
BUN: 33 mg/dL — ABNORMAL HIGH (ref 8–23)
CO2: 24 mmol/L (ref 22–32)
Calcium: 9.4 mg/dL (ref 8.9–10.3)
Chloride: 99 mmol/L (ref 98–111)
Creatinine, Ser: 1.19 mg/dL (ref 0.61–1.24)
GFR, Estimated: >60 mL/min (ref >60)
Glucose, Bld: 165 mg/dL — ABNORMAL HIGH (ref 70–99)
Potassium: 4.4 mmol/L (ref 3.5–5.1)
Sodium: 136 mmol/L (ref 135–145)
Total Bilirubin: 0.9 mg/dL (ref 0.0–1.2)
Total Protein: 6.4 g/dL — ABNORMAL LOW (ref 6.5–8.1)

## 2023-09-12 MED ORDER — PREDNISONE 20 MG PO TABS: 20.0000 mg | ORAL_TABLET | Freq: Every day | ORAL | 0 refills | Status: DC

## 2023-09-12 NOTE — Progress Notes (Signed)
 NAME: Jeremy Valencia  DOB: 11-12-1940  MRN: [id number]  Date/Time: 09/12/2023 12:02 PM   Subjective:  Here with daughter and wife Urgent visit Worsening rash Pt's rash reappeared on 09/08/23- Antibiotic Vanco and meropenem  stopped that day ( day 38)  ( instead of 1/7-) Pt was discharged from [facility]- Family saw that his rash was worsening They called yesterday and I am seeing him today HE has no lesions in his mouth, eyes or penis mucosa No fever or chills Rash all over- itchy ? Jeremy Valencia is a 83 y.o. 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 The patient was initially on vancomycin  and ceftriaxone  and was transferred to CIR ,A rash was noted on the back on 08/15/23 thought due to an allergic reaction to ceftriaxone , and it was DC- 08/18/23 the rash was much improved and meropenem  2 grams IV Q8 was started.  HE went to [facility] from [facility] on 08/24/23 - I saw him on 08/31/23  and he was doing well- The plan that day was to complete 6 weeks of IV for culture neg endocarditis on 09/12/23- the meropenem  dose was reduced to 1 gram Q8 ( from 2 grams TID).  HE then had reappearance of the rash    Past Medical History:  Diagnosis Date   Arthritis    BPH (benign prostatic hyperplasia)    Bursitis 05/16/2013   BURSITIS/CAPSULITIS RT ANKLE   Cancer (HCC)    skin cancer   Capsulitis of right foot 01/21/2013   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    Diabetes mellitus without complication (HCC)    Diverticulosis    Emphysema, unspecified (HCC)    GERD (gastroesophageal reflux disease)    Hyperlipidemia    Hypertension    Plantar fasciitis     Past Surgical History:  Procedure Laterality Date   APPENDECTOMY     BACK SURGERY     CATARACT EXTRACTION Left    COLONOSCOPY      COLONOSCOPY WITH PROPOFOL  N/A 04/06/2021   Procedure: COLONOSCOPY WITH PROPOFOL ;  Surgeon: Maryruth Ole DASEN, MD;  Location: [facility] ENDOSCOPY;  Service: Endoscopy;  Laterality: N/A;  DM   FOOT SURGERY     LAMINECTOMY     right eye     ROTATOR CUFF REPAIR Right    TEE WITHOUT CARDIOVERSION N/A 08/08/2023   Procedure: TRANSESOPHAGEAL ECHOCARDIOGRAM (TEE);  Surgeon: Perla Evalene PARAS, MD;  Location: [facility] ORS;  Service: Cardiovascular;  Laterality: N/A;   UPPER GI ENDOSCOPY      Social History   Socioeconomic History   Marital status: Married    Spouse name: Not on file   Number of children: Not on file   Years of education: Not on file   Highest education level: Not on file  Occupational History   Not on file  Tobacco Use   Smoking status: Former   Smokeless tobacco: Never   Tobacco comments:    1998  Vaping Use   Vaping status: Never Used  Substance and Sexual Activity   Alcohol  use: Yes    Comment: rarely   Drug use: No   Sexual activity: Never  Other Topics Concern   Not on file  Social History Narrative   Not on file   Social Drivers of Health   Financial Resource Strain:  Not on file  Food Insecurity: Patient Unable To Answer (08/03/2023)   Hunger Vital Sign    Worried About Running Out of Food in the Last Year: Patient unable to answer    Ran Out of Food in the Last Year: Patient unable to answer  Transportation Needs: Patient Unable To Answer (08/03/2023)   PRAPARE - Transportation    Lack of Transportation (Medical): Patient unable to answer    Lack of Transportation (Non-Medical): Patient unable to answer  Physical Activity: Not on file  Stress: Not on file  Social Connections: Not on file  Intimate Partner Violence: Patient Unable To Answer (08/03/2023)   Humiliation, Afraid, Rape, and Kick questionnaire    Fear of Current or Ex-Partner: Patient unable to answer    Emotionally Abused: Patient unable to answer    Physically Abused: Patient unable to answer     Sexually Abused: Patient unable to answer    Family History  Problem Relation Age of Onset   Cerebral aneurysm Mother    CAD Father    Parkinson's disease Father    Prostate cancer Father    Allergies  Allergen Reactions   Metformin Diarrhea   Rofecoxib Other (See Comments)    Other reaction(s): Unknown   Lamisil  [Terbinafine ] Rash   I? Current Outpatient Medications  Medication Sig Dispense Refill   acetaminophen  (TYLENOL ) 325 MG tablet Take 1-2 tablets (325-650 mg total) by mouth every 4 (four) hours as needed for mild pain (pain score 1-3).     artificial tears (LACRILUBE) OINT ophthalmic ointment Place into the right eye 2 (two) times daily.     aspirin  81 MG tablet Take 81 mg by mouth daily.     atorvastatin  (LIPITOR) 20 MG tablet Take 20 mg by mouth daily.     B Complex-C (B-COMPLEX WITH VITAMIN C ) tablet Take 1 tablet by mouth daily.     Chlorhexidine  Gluconate Cloth 2 % PADS Apply 6 each topically every 12 (twelve) hours.     cholecalciferol  (CHOLECALCIFEROL ) 25 MCG tablet Take 3 tablets (3,000 Units total) by mouth daily.     clopidogrel  (PLAVIX ) 75 MG tablet Take 1 tablet (75 mg total) by mouth daily.     empagliflozin  (JARDIANCE ) 25 MG TABS tablet Take 1 tablet (25 mg total) by mouth daily.     enoxaparin  (LOVENOX ) 40 MG/0.4ML injection Inject 0.4 mLs (40 mg total) into the skin daily.     glimepiride  (AMARYL ) 4 MG tablet Take 4 mg by mouth every morning.     hydrALAZINE  (APRESOLINE ) 10 MG tablet Take 1 tablet (10 mg total) by mouth every 8 (eight) hours.     hydrocortisone  cream 1 % Apply topically 2 (two) times daily.     insulin  glargine-yfgn (SEMGLEE ) 100 UNIT/ML injection Inject 0.05 mLs (5 Units total) into the skin daily.     lisinopril  (ZESTRIL ) 10 MG tablet Take 1 tablet (10 mg total) by mouth daily.     magnesium  gluconate (MAGONATE) 500 MG tablet Take 0.5 tablets (250 mg total) by mouth at bedtime.     melatonin 5 MG TABS Take 1 tablet (5 mg total) by mouth at  bedtime.     timolol  (TIMOPTIC ) 0.5 % ophthalmic solution Place 1 drop into the left eye daily.     No current facility-administered medications for this visit.     Abtx:  Anti-infectives (From admission, onward)    None       REVIEW OF SYSTEMS:  Const: negative fever, negative chills, negative  weight loss Eyes: rt eye prosthesis  negative diplopia or visual changes, negative eye pain ENT: negative coryza, negative sore throat Resp: negative cough, hemoptysis, dyspnea Cards: negative for chest pain, palpitations, lower extremity edema GU: negative for frequency, dysuria and hematuria GI: Negative for abdominal pain, diarrhea, bleeding, constipation Skin: rash and pruritus Heme: negative for easy bruising and gum/nose bleeding MS: negative for myalgias, arthralgias, back pain and muscle weakness Neurolo:negative for headaches, dizziness, vertigo, memory problems  Psych: negative for feelings of anxiety, depression  Endocrine: , diabetes Allergy/Immunology- negative for any medication or food allergies ? Pertinent Positives include : Objective:  VITALS:  BP 119/66   Pulse 92   Temp (!) 97.4 F (36.3 C) (Temporal)   SpO2 94%   PHYSICAL EXAM:  General: Alert, cooperative, no distress, appears stated age. In wheel chair Head: Normocephalic, without obvious abnormality, atraumatic. Eyes: rt eye prosthesisl ENT Nares normal. No drainage or sinus tenderness. Lips, mucosa, and tongue normal. No Thrush Neck: Supple, symmetrical, no adenopathy, thyroid : non tender no carotid bruit and no JVD. Back: No CVA tenderness. Lungs: Clear to auscultation bilaterally. No Wheezing or Rhonchi. No rales. Heart: Regular rate and rhythm, no murmur, rub or gallop. Abdomen: did not examine Extremities: atraumatic, no cyanosis. No edema. No clubbing Skin: erythematous macular papluar rash all over        Does not involve mucous membrane of eye, mouth and penis Lymph: Cervical,  supraclavicular normal. Neurologic: Grossly non-focal Pertinent Labs none     ? Impression/Recommendation  Erythematous extensive rash- drug allergy due to/cephalosporin and now meropenem  Sparing mucosa Off antibiotics since 09/08/23 Could vanco be the culprit?? 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 Patient had a stroke with clots in the brain. Completed 38 days of Iv vanco/meropenem  - stopped due to rash No need for furtehr antibiotics . -Follow up with Dr. Gollan, the cardiologist, for repeat  echocardiogram .  Embolic stroke- improved significantly  Will do CBC with diff to check for eosinophilia And CMP _____________ Discussed with patient, daughter and wife Discussed with Dr.klein Will follow up with him  P.s absolute eosinophilic count is 1.6 ( op from 0.6) % 14 9 was 7 before)

## 2023-09-12 NOTE — Patient Instructions (Signed)
 You are here with worsening rash- Meropenem  and vancomucin was stoppe don 09/08/23 It could still be due to the lingering meropenem  Discussed with your PCP- will do prednisone  20mg  X5 days- He will see you in his office next week Today will do labs CBC/CMP

## 2023-09-12 NOTE — Telephone Encounter (Signed)
 Spoke with Jazen and his wife, they will come in today at 11:30 for evaluation.   Sandie Ano, RN

## 2023-09-13 ENCOUNTER — Encounter: Payer: Self-pay | Admitting: Infectious Diseases

## 2023-09-14 ENCOUNTER — Telehealth: Payer: Self-pay

## 2023-09-14 NOTE — Telephone Encounter (Signed)
-----   Message from DONALD BERLIN sent at 09/13/2023 11:44 AM EST ----- I see a patient message asking about the high wbc- just let them know that it is because of the rash and not to worry about infection- The eosinophil count has also increased indicating allergic rash- Please check to see whether he is doing better with predniosne and he will follow with Dr.Klein. thx ----- Message ----- From: Interface, Lab In [place] Sent: 09/12/2023   1:15 PM EST To: Donald Berlin, MD

## 2023-09-14 NOTE — Telephone Encounter (Signed)
 Mychart msg sent to the patient will all results and information. Youlanda Tomassetti Jonathon Resides, CMA

## 2023-09-15 ENCOUNTER — Telehealth: Payer: Self-pay | Admitting: Infectious Diseases

## 2023-09-15 NOTE — Telephone Encounter (Signed)
 Called to find out whether rash was better with prednisone- wife says slight improvement Asked to follow up with Dr.klein next week

## 2023-09-18 DIAGNOSIS — Z8673 Personal history of transient ischemic attack (TIA), and cerebral infarction without residual deficits: Secondary | ICD-10-CM | POA: Insufficient documentation

## 2023-09-20 ENCOUNTER — Ambulatory Visit: Payer: Medicare Other | Attending: Cardiovascular Disease

## 2023-09-20 DIAGNOSIS — I330 Acute and subacute infective endocarditis: Secondary | ICD-10-CM | POA: Diagnosis not present

## 2023-09-20 LAB — ECHOCARDIOGRAM LIMITED

## 2023-09-22 ENCOUNTER — Encounter: Payer: Self-pay | Admitting: Cardiovascular Disease

## 2023-09-22 ENCOUNTER — Other Ambulatory Visit: Payer: Self-pay | Admitting: Emergency Medicine

## 2023-09-22 DIAGNOSIS — Z79899 Other long term (current) drug therapy: Secondary | ICD-10-CM

## 2023-09-26 ENCOUNTER — Telehealth: Payer: Self-pay | Admitting: *Deleted

## 2023-09-26 NOTE — Telephone Encounter (Signed)
-----   Message from Nicolasa Ducking sent at 09/25/2023 10:48 AM EST ----- Though he's scheduled to see me on 1/23, and Gollan did a TEE in December, we've never actually formally seen this patient before.  When we did his TEE in December, we had to get consent through his wife b/c he had altered mental status.  B/c we've never seen him before, he needs a new pt appt w/ a doc prior to his TEE (not DCCV as you've indicated). ----- Message ----- From: Bryna Colander, RN Sent: 09/25/2023  10:44 AM EST To: Creig Hines, NP  Could you please place orders for this patients scheduled DCCV on 1/28 at 07:30. Thanks

## 2023-09-26 NOTE — Telephone Encounter (Signed)
-----   Message from Nicolasa Ducking sent at 09/25/2023  3:42 PM EST ----- Makes sense to be Gollan if he's got a slot, since he did the TEE, read the echo, and rec the repeat TEE ----- Message ----- From: Bryna Colander, RN Sent: 09/25/2023  12:07 PM EST To: Creig Hines, NP  Gotcha. Apologies I was looking at a different patient. Do you have a preference on who he should see? ----- Message ----- From: Creig Hines, NP Sent: 09/25/2023  10:52 AM EST To: Bryna Colander, RN  Though he's scheduled to see me on 1/23, and Gollan did a TEE in December, we've never actually formally seen this patient before.  When we did his TEE in December, we had to get consent through his wife b/c he had altered mental status.  B/c we've never seen him before, he needs a new pt appt w/ a doc prior to his TEE (not DCCV as you've indicated). ----- Message ----- From: Bryna Colander, RN Sent: 09/25/2023  10:44 AM EST To: Creig Hines, NP  Could you please place orders for this patients scheduled DCCV on 1/28 at 07:30. Thanks

## 2023-09-26 NOTE — Telephone Encounter (Signed)
Spoke with patients wife and was able to change his appointment to next available with Dr. Mariah Milling which is the day before his procedure at the request of Nicolasa Ducking NP. Spouse verbalized understanding and had no further questions at this time.

## 2023-09-26 NOTE — Telephone Encounter (Addendum)
Left voicemail message to call back in regards to scheduling with instructions to ask for [facility]   Patient needs to see Dr. Mariah Milling to establish care

## 2023-09-27 ENCOUNTER — Other Ambulatory Visit: Payer: Self-pay

## 2023-09-27 DIAGNOSIS — Z79899 Other long term (current) drug therapy: Secondary | ICD-10-CM

## 2023-09-27 NOTE — Telephone Encounter (Signed)
I released orders for [facility], advised that they would be able to see these orders.   Wife also had questions regarding the medications to hold before TEE. I did review these medications, and answered any and all questions.   Patient wife verbalized understanding, thankful for call back

## 2023-09-27 NOTE — Telephone Encounter (Signed)
[name] is following up requesting to have lab orders faxed to [facility] (fax#: [phone]). She would also like to discuss medication instructions for TEE.

## 2023-09-28 ENCOUNTER — Ambulatory Visit: Payer: Medicare Other | Admitting: Nurse Practitioner

## 2023-09-28 NOTE — Telephone Encounter (Signed)
Appointment changed to 10/02/23. See telephone encounter.

## 2023-09-29 DIAGNOSIS — I058 Other rheumatic mitral valve diseases: Secondary | ICD-10-CM | POA: Insufficient documentation

## 2023-09-29 LAB — CBC
Hematocrit: 45.6 % (ref 37.5–51.0)
Hemoglobin: 15.8 g/dL (ref 13.0–17.7)
MCH: 33.8 pg — ABNORMAL HIGH (ref 26.6–33.0)
MCHC: 34.6 g/dL (ref 31.5–35.7)
MCV: 98 fL — ABNORMAL HIGH (ref 79–97)
Platelets: 239 10*3/uL (ref 150–450)
RBC: 4.67 x10E6/uL (ref 4.14–5.80)
RDW: 12.2 % (ref 11.6–15.4)
WBC: 13.1 10*3/uL — ABNORMAL HIGH (ref 3.4–10.8)

## 2023-09-29 LAB — BASIC METABOLIC PANEL
BUN/Creatinine Ratio: 24 (ref 10–24)
BUN: 24 mg/dL (ref 8–27)
CO2: 21 mmol/L (ref 20–29)
Calcium: 9.2 mg/dL (ref 8.6–10.2)
Chloride: 101 mmol/L (ref 96–106)
Creatinine, Ser: 1 mg/dL (ref 0.76–1.27)
Glucose: 129 mg/dL — ABNORMAL HIGH (ref 70–99)
Potassium: 4.6 mmol/L (ref 3.5–5.2)
Sodium: 141 mmol/L (ref 134–144)
eGFR: 74 mL/min/{1.73_m2} (ref >59)

## 2023-09-29 NOTE — H&P (View-Only) (Signed)
Cardiology Office Note  Date:  10/02/2023   ID:  Devian, Bartolomei 17-Aug-1941, MRN [id number]  PCP:  Lynnea Ferrier, MD   Chief Complaint  Patient presents with   Follow-up    Patient is scheduled for the TEE tomorrow.     HPI:  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  Recent events reviewed, Hospital admission for stroke in November 2024 Presented to the hospital confusion, lethargy, chills nausea, felt to have sepsis  Transthoracic echo with mitral valve thickening Transesophageal echo with concern for posterior mitral valve leaflet endocarditis Given large mobile mass/vegetation on posterior leaflet  Since then has been treated with antibiotics, vancomycin and ceftriaxone Secondary to rash, changed to meropenem Completed 6 weeks Culture negative September 12, 2023 Recurrent rash  Repeat transthoracic echo September 20, 2023 with concern for persistent mitral valve opacity/mass though images were insufficient  Presents today, no additional TIA or stroke symptoms Initial TEE images pulled up and reviewed  Scheduled for repeat TEE tomorrow  EKG personally reviewed by myself on todays visit EKG Interpretation Date/Time:  Monday October 02 2023 15:42:53 EST Ventricular Rate:  81 PR Interval:  154 QRS Duration:  102 QT Interval:  372 QTC Calculation: 432 R Axis:   26  Text Interpretation: Normal sinus rhythm Normal ECG When compared with ECG of 01-Aug-2023 22:08, No significant change was found Confirmed by Julien Nordmann [phone]) on 10/02/2023 3:47:29 PM    PMH:   has a past medical history of Arthritis, BPH (benign  prostatic hyperplasia), Bursitis (05/16/2013), Cancer (HCC), Capsulitis of right foot (01/21/2013), Diabetes mellitus without complication (HCC), Diverticulosis, Emphysema, unspecified (HCC), GERD (gastroesophageal reflux disease), Hyperlipidemia, Hypertension, and Plantar fasciitis.  PSH:    Past Surgical History:  Procedure Laterality Date   APPENDECTOMY     BACK SURGERY     CATARACT EXTRACTION Left    COLONOSCOPY     COLONOSCOPY WITH PROPOFOL N/A 04/06/2021   Procedure: COLONOSCOPY WITH PROPOFOL;  Surgeon: Regis Bill, MD;  Location: [facility] ENDOSCOPY;  Service: Endoscopy;  Laterality: N/A;  DM   FOOT SURGERY     LAMINECTOMY     right eye     ROTATOR CUFF REPAIR Right    TEE WITHOUT CARDIOVERSION N/A 08/08/2023   Procedure: TRANSESOPHAGEAL ECHOCARDIOGRAM (TEE);  Surgeon: Antonieta Iba, MD;  Location: [facility] ORS;  Service: Cardiovascular;  Laterality: N/A;   UPPER GI ENDOSCOPY      Current Outpatient Medications  Medication Sig Dispense Refill   atorvastatin (LIPITOR) 20 MG tablet Take 20 mg by mouth daily.     clopidogrel (PLAVIX) 75 MG tablet Take 1 tablet (75 mg total) by mouth daily.     doxepin (SINEQUAN) 25 MG capsule Take 25 mg by mouth at bedtime.     empagliflozin (JARDIANCE) 10 MG TABS tablet Take 10 mg by mouth daily.     gabapentin (NEURONTIN) 300 MG capsule Take 300 mg by mouth at bedtime.     glimepiride (AMARYL) 4 MG tablet Take 4 mg by mouth every morning.     hydrALAZINE (APRESOLINE) 25 MG tablet Take 25 mg by mouth 2 (two) times daily.     lisinopril (ZESTRIL) 20 MG tablet Take 20 mg by mouth daily.  Multiple Vitamin (MULTIVITAMIN WITH MINERALS) TABS tablet Take 1 tablet by mouth daily.     timolol (TIMOPTIC) 0.5 % ophthalmic solution Place 1 drop into the left eye daily.     TURMERIC CURCUMIN PO Take 550 mg by mouth daily.     Ubiquinol 100 MG CAPS Take 100 mg by mouth daily.     vitamin E 180 MG (400 UNITS) capsule Take 400 Units by mouth daily.      Zinc 50 MG TABS Take 50 mg by mouth daily.     No current facility-administered medications for this visit.     Allergies:   Metformin, Rofecoxib, Ceftriaxone, and Lamisil [terbinafine]   Social History:  The patient  reports that he has quit smoking. He has never used smokeless tobacco. He reports current alcohol use. He reports that he does not use drugs.   Family History:   family history includes CAD in his father; Cerebral aneurysm in his mother; Parkinson's disease in his father; Prostate cancer in his father.    Review of Systems: Review of Systems  Constitutional: Negative.   HENT: Negative.    Respiratory: Negative.    Cardiovascular: Negative.   Gastrointestinal: Negative.   Musculoskeletal: Negative.   Neurological: Negative.   Psychiatric/Behavioral: Negative.    All other systems reviewed and are negative.    PHYSICAL EXAM: VS:  BP 120/60 (BP Location: Left Arm, Patient Position: Sitting, Cuff Size: Normal)   Pulse 81   Ht 6' (1.829 m)   Wt 191 lb 2 oz (86.7 kg)   SpO2 97%   BMI 25.92 kg/m  , BMI Body mass index is 25.92 kg/m. GEN: Well nourished, well developed, in no acute distress HEENT: normal Neck: no JVD, carotid bruits, or masses Cardiac: RRR; no murmurs, rubs, or gallops,no edema  Respiratory:  clear to auscultation bilaterally, normal work of breathing GI: soft, nontender, nondistended, + BS MS: no deformity or atrophy Skin: warm and dry, no rash Neuro:  Strength and sensation are intact Psych: euthymic mood, full affect   Recent Labs: 08/02/2023: B Natriuretic Peptide 113.4 08/09/2023: Magnesium 1.9 09/12/2023: ALT 37 09/28/2023: BUN 24; Creatinine, Ser 1.00; Hemoglobin 15.8; Platelets 239; Potassium 4.6; Sodium 141    Lipid Panel No results found for: "CHOL", "HDL", "LDLCALC", "TRIG"    Wt Readings from Last 3 Encounters:  10/02/23 191 lb 2 oz (86.7 kg)  08/11/23 191 lb 9.3 oz (86.9 kg)  08/02/23 207 lb 10.8 oz (94.2 kg)        ASSESSMENT AND PLAN:  Problem List Items Addressed This Visit       Cardiology Problems   Mitral valve mass - Primary   Relevant Orders   EKG 12-Lead (Completed)   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 [facility] [place], [place] [phone]

## 2023-09-29 NOTE — Progress Notes (Unsigned)
Cardiology Office Note  Date:  10/02/2023   ID:  Jeremy, Valencia Sep 13, 1940, MRN [id number]  PCP:  Lynnea Ferrier, MD   Chief Complaint  Patient presents with   Follow-up    Patient is scheduled for the TEE tomorrow.     HPI:  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  Recent events reviewed, Hospital admission for stroke in November 2024 Presented to the hospital confusion, lethargy, chills nausea, felt to have sepsis  Transthoracic echo with mitral valve thickening Transesophageal echo with concern for posterior mitral valve leaflet endocarditis Given large mobile mass/vegetation on posterior leaflet  Since then has been treated with antibiotics, vancomycin and ceftriaxone Secondary to rash, changed to meropenem Completed 6 weeks Culture negative September 12, 2023 Recurrent rash  Repeat transthoracic echo September 20, 2023 with concern for persistent mitral valve opacity/mass though images were insufficient  Presents today, no additional TIA or stroke symptoms Initial TEE images pulled up and reviewed  Scheduled for repeat TEE tomorrow  EKG personally reviewed by myself on todays visit EKG Interpretation Date/Time:  Monday October 02 2023 15:42:53 EST Ventricular Rate:  81 PR Interval:  154 QRS Duration:  102 QT Interval:  372 QTC Calculation: 432 R Axis:   26  Text Interpretation: Normal sinus rhythm Normal ECG When compared with ECG of 01-Aug-2023 22:08, No significant change was found Confirmed by Julien Nordmann [phone]) on 10/02/2023 3:47:29 PM    PMH:   has a past medical history of Arthritis, BPH (benign  prostatic hyperplasia), Bursitis (05/16/2013), Cancer (HCC), Capsulitis of right foot (01/21/2013), Diabetes mellitus without complication (HCC), Diverticulosis, Emphysema, unspecified (HCC), GERD (gastroesophageal reflux disease), Hyperlipidemia, Hypertension, and Plantar fasciitis.  PSH:    Past Surgical History:  Procedure Laterality Date   APPENDECTOMY     BACK SURGERY     CATARACT EXTRACTION Left    COLONOSCOPY     COLONOSCOPY WITH PROPOFOL N/A 04/06/2021   Procedure: COLONOSCOPY WITH PROPOFOL;  Surgeon: Regis Bill, MD;  Location: [facility] ENDOSCOPY;  Service: Endoscopy;  Laterality: N/A;  DM   FOOT SURGERY     LAMINECTOMY     right eye     ROTATOR CUFF REPAIR Right    TEE WITHOUT CARDIOVERSION N/A 08/08/2023   Procedure: TRANSESOPHAGEAL ECHOCARDIOGRAM (TEE);  Surgeon: Antonieta Iba, MD;  Location: [facility] ORS;  Service: Cardiovascular;  Laterality: N/A;   UPPER GI ENDOSCOPY      Current Outpatient Medications  Medication Sig Dispense Refill   atorvastatin (LIPITOR) 20 MG tablet Take 20 mg by mouth daily.     clopidogrel (PLAVIX) 75 MG tablet Take 1 tablet (75 mg total) by mouth daily.     doxepin (SINEQUAN) 25 MG capsule Take 25 mg by mouth at bedtime.     empagliflozin (JARDIANCE) 10 MG TABS tablet Take 10 mg by mouth daily.     gabapentin (NEURONTIN) 300 MG capsule Take 300 mg by mouth at bedtime.     glimepiride (AMARYL) 4 MG tablet Take 4 mg by mouth every morning.     hydrALAZINE (APRESOLINE) 25 MG tablet Take 25 mg by mouth 2 (two) times daily.     lisinopril (ZESTRIL) 20 MG tablet Take 20 mg by mouth daily.  Multiple Vitamin (MULTIVITAMIN WITH MINERALS) TABS tablet Take 1 tablet by mouth daily.     timolol (TIMOPTIC) 0.5 % ophthalmic solution Place 1 drop into the left eye daily.     TURMERIC CURCUMIN PO Take 550 mg by mouth daily.     Ubiquinol 100 MG CAPS Take 100 mg by mouth daily.     vitamin E 180 MG (400 UNITS) capsule Take 400 Units by mouth daily.      Zinc 50 MG TABS Take 50 mg by mouth daily.     No current facility-administered medications for this visit.     Allergies:   Metformin, Rofecoxib, Ceftriaxone, and Lamisil [terbinafine]   Social History:  The patient  reports that he has quit smoking. He has never used smokeless tobacco. He reports current alcohol use. He reports that he does not use drugs.   Family History:   family history includes CAD in his father; Cerebral aneurysm in his mother; Parkinson's disease in his father; Prostate cancer in his father.    Review of Systems: Review of Systems  Constitutional: Negative.   HENT: Negative.    Respiratory: Negative.    Cardiovascular: Negative.   Gastrointestinal: Negative.   Musculoskeletal: Negative.   Neurological: Negative.   Psychiatric/Behavioral: Negative.    All other systems reviewed and are negative.    PHYSICAL EXAM: VS:  BP 120/60 (BP Location: Left Arm, Patient Position: Sitting, Cuff Size: Normal)   Pulse 81   Ht 6' (1.829 m)   Wt 191 lb 2 oz (86.7 kg)   SpO2 97%   BMI 25.92 kg/m  , BMI Body mass index is 25.92 kg/m. GEN: Well nourished, well developed, in no acute distress HEENT: normal Neck: no JVD, carotid bruits, or masses Cardiac: RRR; no murmurs, rubs, or gallops,no edema  Respiratory:  clear to auscultation bilaterally, normal work of breathing GI: soft, nontender, nondistended, + BS MS: no deformity or atrophy Skin: warm and dry, no rash Neuro:  Strength and sensation are intact Psych: euthymic mood, full affect   Recent Labs: 08/02/2023: B Natriuretic Peptide 113.4 08/09/2023: Magnesium 1.9 09/12/2023: ALT 37 09/28/2023: BUN 24; Creatinine, Ser 1.00; Hemoglobin 15.8; Platelets 239; Potassium 4.6; Sodium 141    Lipid Panel No results found for: "CHOL", "HDL", "LDLCALC", "TRIG"    Wt Readings from Last 3 Encounters:  10/02/23 191 lb 2 oz (86.7 kg)  08/11/23 191 lb 9.3 oz (86.9 kg)  08/02/23 207 lb 10.8 oz (94.2 kg)        ASSESSMENT AND PLAN:  Problem List Items Addressed This Visit       Cardiology Problems   Mitral valve mass - Primary   Relevant Orders   EKG 12-Lead (Completed)   Mitral valve mass Noted on TEE initially treated as endocarditis Concern on repeat transthoracic echo for persistent mass/vegetation If no improvement on TEE tomorrow, this would 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 [facility] [place], [place] [phone]

## 2023-10-02 ENCOUNTER — Ambulatory Visit: Payer: Medicare Other | Attending: Cardiovascular Disease | Admitting: Cardiovascular Disease

## 2023-10-02 VITALS — BP 120/60 | HR 81 | Ht 72.0 in | Wt 191.1 lb

## 2023-10-02 DIAGNOSIS — I058 Other rheumatic mitral valve diseases: Secondary | ICD-10-CM

## 2023-10-02 DIAGNOSIS — Z8673 Personal history of transient ischemic attack (TIA), and cerebral infarction without residual deficits: Secondary | ICD-10-CM

## 2023-10-02 NOTE — Patient Instructions (Addendum)
Medication Instructions:  No changes  If you need a refill on your cardiac medications before your next appointment, please call your pharmacy.    Lab work: No new labs needed   Testing/Procedures: No new testing needed   Follow-Up: At [facility], you and your health needs are our priority.  As part of our continuing mission to provide you with exceptional heart care, we have created designated Provider Care Teams.  These Care Teams include your primary Cardiologist (physician) and Advanced Practice Providers (APPs -  Physician Assistants and Nurse Practitioners) who all work together to provide you with the care you need, when you need it.  You will need a follow up appointment in 6 months  Providers on your designated Care Team:   Nicolasa Ducking, NP Eula Listen, PA-C Cadence Fransico [place], [place]  COVID-19 Vaccine Information can be found at: [email or web address] For questions related to vaccine distribution or appointments, please email [email or web address] or call [phone].

## 2023-10-03 ENCOUNTER — Ambulatory Visit: Payer: Medicare Other | Admitting: Anesthesiology

## 2023-10-03 ENCOUNTER — Ambulatory Visit (HOSPITAL_BASED_OUTPATIENT_CLINIC_OR_DEPARTMENT_OTHER)
Admission: RE | Admit: 2023-10-03 | Discharge: 2023-10-03 | Disposition: A | Discharge status: Home / Self Care | Payer: Medicare Other | Source: Home / Self Care | Attending: Cardiovascular Disease | Admitting: Cardiovascular Disease

## 2023-10-03 ENCOUNTER — Other Ambulatory Visit: Payer: Self-pay | Admitting: Emergency Medicine

## 2023-10-03 ENCOUNTER — Encounter
Admission: RE | Disposition: A | Discharge status: Home / Self Care | Payer: Medicare Other | Source: Home / Self Care | Attending: Cardiovascular Disease

## 2023-10-03 ENCOUNTER — Other Ambulatory Visit: Payer: Self-pay | Admitting: Cardiovascular Disease

## 2023-10-03 ENCOUNTER — Ambulatory Visit
Admission: RE | Admit: 2023-10-03 | Discharge: 2023-10-03 | Disposition: A | Discharge status: Home / Self Care | Payer: Medicare Other | Attending: Cardiovascular Disease | Admitting: Cardiovascular Disease

## 2023-10-03 DIAGNOSIS — I058 Other rheumatic mitral valve diseases: Secondary | ICD-10-CM

## 2023-10-03 DIAGNOSIS — I330 Acute and subacute infective endocarditis: Secondary | ICD-10-CM

## 2023-10-03 DIAGNOSIS — I10 Essential (primary) hypertension: Secondary | ICD-10-CM | POA: Insufficient documentation

## 2023-10-03 DIAGNOSIS — E119 Type 2 diabetes mellitus without complications: Secondary | ICD-10-CM | POA: Diagnosis not present

## 2023-10-03 DIAGNOSIS — E785 Hyperlipidemia, unspecified: Secondary | ICD-10-CM | POA: Diagnosis not present

## 2023-10-03 DIAGNOSIS — I700 Atherosclerosis of aorta: Secondary | ICD-10-CM | POA: Insufficient documentation

## 2023-10-03 DIAGNOSIS — Z87891 Personal history of nicotine dependence: Secondary | ICD-10-CM | POA: Diagnosis not present

## 2023-10-03 DIAGNOSIS — Z7984 Long term (current) use of oral hypoglycemic drugs: Secondary | ICD-10-CM | POA: Diagnosis not present

## 2023-10-03 DIAGNOSIS — Z8673 Personal history of transient ischemic attack (TIA), and cerebral infarction without residual deficits: Secondary | ICD-10-CM | POA: Insufficient documentation

## 2023-10-03 HISTORY — PX: TEE WITHOUT CARDIOVERSION: SHX5443

## 2023-10-03 LAB — GLUCOSE, CAPILLARY: Glucose-Capillary: 135 mg/dL — ABNORMAL HIGH (ref 70–99)

## 2023-10-03 SURGERY — TRANSESOPHAGEAL ECHOCARDIOGRAM (TEE)
Anesthesia: General

## 2023-10-03 MED ORDER — LIDOCAINE HCL (PF) 2 % IJ SOLN
INTRAMUSCULAR | Status: AC
  Filled 2023-10-03: qty 5

## 2023-10-03 MED ORDER — PROPOFOL 10 MG/ML IV BOLUS
INTRAVENOUS | Status: AC
  Filled 2023-10-03: qty 20

## 2023-10-03 MED ORDER — SODIUM CHLORIDE 0.9% FLUSH: 3.0000 mL | INTRAVENOUS | Status: DC | PRN

## 2023-10-03 MED ORDER — SODIUM CHLORIDE 0.9 % IV SOLN: INTRAVENOUS | Status: DC | PRN

## 2023-10-03 MED ORDER — LIDOCAINE VISCOUS HCL 2 % MT SOLN
OROMUCOSAL | Status: AC
  Filled 2023-10-03: qty 15

## 2023-10-03 MED ORDER — BUTAMBEN-TETRACAINE-BENZOCAINE 2-2-14 % EX AERO
INHALATION_SPRAY | CUTANEOUS | Status: AC
  Filled 2023-10-03: qty 5

## 2023-10-03 MED ORDER — PROPOFOL 10 MG/ML IV BOLUS
INTRAVENOUS | Status: DC | PRN
  Administered 2023-10-03 (×3): 10 mg via INTRAVENOUS
  Administered 2023-10-03: 60 mg via INTRAVENOUS
  Administered 2023-10-03 (×4): 10 mg via INTRAVENOUS

## 2023-10-03 MED ORDER — SODIUM CHLORIDE 0.9% FLUSH: 3.0000 mL | Freq: Two times a day (BID) | INTRAVENOUS | Status: DC

## 2023-10-03 NOTE — Anesthesia Procedure Notes (Signed)
Date/Time: 10/03/2023 7:30 AM  Performed by: Stormy Fabian, CRNAPre-anesthesia Checklist: Patient identified, Emergency Drugs available, Suction available and Patient being monitored Patient Re-evaluated:Patient Re-evaluated prior to induction Oxygen Delivery Method: Nasal cannula Induction Type: IV induction Dental Injury: Teeth and Oropharynx as per pre-operative assessment  Comments: Nasal cannula with etCO2 monitoring

## 2023-10-03 NOTE — Progress Notes (Addendum)
Transesophageal Echocardiogram :  Indication: Mitral valve endocarditis Requesting/ordering  physician: Dr. Joylene Draft  History Admission to the hospital August 01, 2019 for mental status changes, sepsis, stroke, no source of infection blood cultures with no growth to date Echo August 05, 2023 concern for endocarditis on posterior leaflet mitral valve Transesophageal echo August 08, 2023 large mobile opacity extending into left atrium attached to posterior leaflet concerning for endocarditis Treated with IV antibiotics as outpatient for 6 weeks Side effects including rash requiring prednisone Transthoracic echo September 20, 2023 thickening posterior leaflet noted -ID preferring not to use suppressive antibiotics in the setting of rash requiring prednisone  Procedure: Benzocaine spray x2 and 2 mls x 2 of viscous lidocaine were given orally to provide local anesthesia to the oropharynx. The patient was positioned supine on the left side, bite block provided. The patient was sedated by anesthesia team with the doses of propofol.  Using digital technique an omniplane probe was advanced into the distal esophagus without incident.   Moderate sedation: 1. Sedation used: Propofol per anesthesia  See report in EPIC  for complete details: In brief, transgastric imaging revealed normal LV function with no RWMAs and no mural apical thrombus.  .  Estimated ejection fraction was 55%.  Right sided cardiac chambers were normal with no evidence of pulmonary hypertension.  Mitral valve with significant thickening of posterior leaflet, mild mitral valve regurgitation.  Previously noted  (12/325) large mobile opacity/mass has essentially resolved.   Imaging of the septum showed no ASD or VSD Bubble study was negative for shunt 2D and color flow confirmed no PFO  Aortic valve sclerosis borderline mild aortic valve stenosis  The LA was well visualized in orthogonal views.  There was no spontaneous  contrast and no thrombus in the LA and LA appendage   The descending thoracic aorta had no  mural aortic debris with no evidence of aneurysmal dilation or dissection Mild to moderate diffuse aortic atherosclerosis aortic arch descending aorta   Case discussed with Dr. Joylene Draft from ID She would prefer no long-term suppressive antibiotics given his recent struggles with profound rash requiring steroids.  There is residual significant thickening of posterior leaflet, unable to definitively exclude residual valve endocarditis Minimal mitral valve regurgitation -Will refer to Dr. 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

## 2023-10-03 NOTE — Progress Notes (Signed)
*  PRELIMINARY RESULTS* Echocardiogram Echocardiogram Transesophageal has been performed.  Jeremy Valencia 10/03/2023, 8:02 AM

## 2023-10-03 NOTE — Progress Notes (Signed)
*  PRELIMINARY RESULTS* Echocardiogram Echocardiogram Transesophageal has been performed.  Cristela Blue 10/03/2023, 1:10 PM

## 2023-10-03 NOTE — Anesthesia Preprocedure Evaluation (Addendum)
Anesthesia Evaluation  Patient identified by MRN, date of birth, ID band Patient awake    Reviewed: Allergy & Precautions, H&P , NPO status , Patient's Chart, lab work & pertinent test results, reviewed documented beta blocker date and time   Airway Mallampati: II  TM Distance: >3 FB Neck ROM: full    Dental  (+) Dental Advidsory Given, Partial Upper   Pulmonary neg shortness of breath, neg recent URI, former smoker   Pulmonary exam normal breath sounds clear to auscultation       Cardiovascular Exercise Tolerance: Good hypertension, Pt. on medications (-) angina (-) Past MI and (-) Cardiac Stents (-) dysrhythmias + Valvular Problems/Murmurs  Rhythm:Regular Rate:Normal  1. Left ventricular ejection fraction, by estimation, is 55 to 60%. The  left ventricle has normal function. The left ventricle has no regional  wall motion abnormalities. Left ventricular diastolic parameters are  consistent with Grade I diastolic  dysfunction (impaired relaxation).   2. Right ventricular systolic function is normal. The right ventricular  size is normal. There is normal pulmonary artery systolic pressure.   3. There is a mobile density of the atrial side of the P2 portion of the  posterior valve leaflet. Measures 1.16 x 0.73 cm. Concerning for  endocarditis. Recommend TEE to further evaluate. The mitral valve is  normal in structure. Mild mitral valve  regurgitation. No evidence of mitral stenosis. Moderate mitral annular  calcification.   4. The aortic valve is tricuspid. There is moderate calcification of the  aortic valve. There is moderate thickening of the aortic valve. Aortic  valve regurgitation is not visualized. No aortic stenosis is present.  Aortic valve area, by VTI measures 2.28   cm. Aortic valve mean gradient measures 6.5 mmHg. Aortic valve Vmax  measures 1.63 m/s.   5. The inferior vena cava is normal in size with greater than  50%  respiratory variability, suggesting right atrial pressure of 3 mmHg.     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 Who presents for follow-up of his mitral valve disease   Past Medical History: No date: Arthritis No date: BPH (benign prostatic hyperplasia) 05/16/2013: Bursitis     Comment:  BURSITIS/CAPSULITIS RT ANKLE No date: Cancer [facility])     Comment:  skin cancer 01/21/2013: Capsulitis of right foot     Comment:  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  No date: Diabetes mellitus without complication (HCC) No date: Diverticulosis No date: Emphysema, unspecified (HCC) No date: GERD (gastroesophageal reflux disease) No date: Hyperlipidemia No date: Hypertension No date: Plantar fasciitis  Past Surgical History: No date: APPENDECTOMY No date: BACK SURGERY No date: CATARACT EXTRACTION; Left No date: COLONOSCOPY 04/06/2021: COLONOSCOPY WITH PROPOFOL; N/A     Comment:  Procedure: COLONOSCOPY WITH PROPOFOL;  Surgeon:               Regis Bill, MD;  Location: [facility] ENDOSCOPY;                Service: Endoscopy;  Laterality: N/A;  DM No date: FOOT SURGERY No date: LAMINECTOMY No  date: right eye No date: ROTATOR CUFF REPAIR; Right 08/08/2023: TEE WITHOUT CARDIOVERSION; N/A     Comment:  Procedure: TRANSESOPHAGEAL ECHOCARDIOGRAM (TEE);                Surgeon: Antonieta Iba, MD;  Location: [facility] ORS;                Service: Cardiovascular;  Laterality: N/A; No date: UPPER GI  ENDOSCOPY     Reproductive/Obstetrics negative OB ROS                             Anesthesia Physical Anesthesia Plan  ASA: 3  Anesthesia Plan: General   Post-op Pain Management: Minimal or no pain anticipated   Induction: Intravenous  PONV Risk Score and Plan: 2 and Propofol infusion and TIVA  Airway Management Planned: Natural Airway and Nasal Cannula  Additional Equipment: None  Intra-op Plan:   Post-operative Plan:   Informed Consent: I have reviewed the patients History and Physical, chart, labs and discussed the procedure including the risks, benefits and alternatives for the proposed anesthesia with the patient or authorized representative who has indicated his/her understanding and acceptance.     Dental Advisory Given  Plan Discussed with: CRNA and Surgeon  Anesthesia Plan Comments:         Anesthesia Quick Evaluation

## 2023-10-03 NOTE — Transfer of Care (Signed)
Immediate Anesthesia Transfer of Care Note  Patient: Jeremy Valencia  Procedure(s) Performed: Procedure(s): TRANSESOPHAGEAL ECHOCARDIOGRAM (TEE) (N/A)  Patient Location: PACU and Short Stay  Anesthesia Type:General  Level of Consciousness: awake, alert  and oriented  Airway & Oxygen Therapy: Patient Spontanous Breathing and Patient connected to nasal cannula oxygen  Post-op Assessment: Report given to RN and Post -op Vital signs reviewed and stable  Post vital signs: Reviewed and stable  Last Vitals:  Vitals:   10/03/23 0757 10/03/23 0758  BP:  95/63  Pulse: 73   Resp: 17 16  Temp:    SpO2: 96% 98%    Complications: No apparent anesthesia complications

## 2023-10-04 LAB — ECHO TEE

## 2023-10-04 NOTE — Anesthesia Postprocedure Evaluation (Signed)
Anesthesia Post Note  Patient: Jeremy Valencia  Procedure(s) Performed: TRANSESOPHAGEAL ECHOCARDIOGRAM (TEE)  Patient location during evaluation: PACU Anesthesia Type: General Level of consciousness: awake and alert Pain management: pain level controlled Vital Signs Assessment: post-procedure vital signs reviewed and stable Respiratory status: spontaneous breathing, nonlabored ventilation and respiratory function stable Cardiovascular status: blood pressure returned to baseline and stable Postop Assessment: no apparent nausea or vomiting Anesthetic complications: no   No notable events documented.   Last Vitals:  Vitals:   10/03/23 0830 10/03/23 0845  BP: 129/78 122/73  Pulse: 73 71  Resp: (!) 22 19  Temp:    SpO2: 98% 96%    Last Pain:  Vitals:   10/03/23 0845  TempSrc:   PainSc: 0-No pain                 Foye Deer

## 2023-10-05 ENCOUNTER — Encounter: Payer: Self-pay | Admitting: Cardiovascular Disease

## 2023-10-06 NOTE — H&P (Signed)
 Patient was seen and evaluated prior to transesophageal echo procedure Symptoms, prior testing details again confirmed with the patient Patient examined, no significant change from prior exam Lab work reviewed in detail personally by myself After careful review of history and examination, the risks and benefits of transesophageal echocardiogram have been explained including risks of esophageal damage, perforation (1:10,000 risk), bleeding, pharyngeal hematoma as well as other potential complications associated with conscious sedation including aspiration, arrhythmia, respiratory failure and death. Alternatives to treatment were discussed, questions were answered.  Patient is willing to proceed.   Signed, Dossie Arbour, MD, Ph.D [facility]

## 2023-10-06 NOTE — Interval H&P Note (Signed)
History and Physical Interval Note:  10/06/2023 3:42 PM  Jeremy Valencia  has presented today for surgery, with the diagnosis of TEE    Mass mitral valve.  The various methods of treatment have been discussed with the patient and family. After consideration of risks, benefits and other options for treatment, the patient has consented to  Procedure(s): TRANSESOPHAGEAL ECHOCARDIOGRAM (TEE) (N/A) as a surgical intervention.  The patient's history has been reviewed, patient examined, no change in status, stable for surgery.  I have reviewed the patient's chart and labs.  Questions were answered to the patient's satisfaction.     Julien Nordmann

## 2023-10-09 ENCOUNTER — Ambulatory Visit: Payer: Medicare Other | Admitting: Cardiovascular Disease

## 2023-10-09 ENCOUNTER — Encounter: Payer: Self-pay | Admitting: Cardiovascular Disease

## 2023-10-11 NOTE — Telephone Encounter (Signed)
  Pt's wife calling back to follow up. She said, Dr. Gollan mentioned Dr Linder Revere for the pt. She said, Dr. Gollan can call her back at  [phone] or respond to their mychart message

## 2023-10-19 ENCOUNTER — Other Ambulatory Visit: Payer: Self-pay | Admitting: Emergency Medicine

## 2023-10-19 ENCOUNTER — Encounter: Payer: Medicare Other | Admitting: Thoracic Surgery (Cardiothoracic Vascular Surgery)

## 2023-10-19 DIAGNOSIS — I058 Other rheumatic mitral valve diseases: Secondary | ICD-10-CM

## 2023-10-19 DIAGNOSIS — I330 Acute and subacute infective endocarditis: Secondary | ICD-10-CM

## 2023-10-19 NOTE — Telephone Encounter (Signed)
Called and spoke with patient. Notified him of the following from Dr.Gollan.  I talked with Dr. Rivka Safer and she is not going to use antibiotics given prior rash.  We think it is best if Jeremy Valencia meet with the  CT surgery team so they can look at the images of the mitral valve   Patient verbalizes understanding.

## 2023-11-09 ENCOUNTER — Inpatient Hospital Stay: Payer: Medicare HMO | Admitting: Neurology

## 2023-11-13 ENCOUNTER — Ambulatory Visit: Payer: Medicare HMO | Admitting: Podiatry

## 2023-11-13 ENCOUNTER — Encounter: Payer: Self-pay | Admitting: Podiatry

## 2023-11-13 DIAGNOSIS — D2371 Other benign neoplasm of skin of right lower limb, including hip: Secondary | ICD-10-CM | POA: Diagnosis not present

## 2023-11-13 DIAGNOSIS — M19071 Primary osteoarthritis, right ankle and foot: Secondary | ICD-10-CM | POA: Diagnosis not present

## 2023-11-13 MED ORDER — TRIAMCINOLONE ACETONIDE 40 MG/ML IJ SUSP
20.0000 mg | Freq: Once | INTRAMUSCULAR | Status: AC
  Administered 2023-11-13: 20 mg

## 2023-11-13 MED ORDER — GABAPENTIN 300 MG PO CAPS: 300.0000 mg | ORAL_CAPSULE | Freq: Three times a day (TID) | ORAL | 3 refills | Status: DC

## 2023-11-13 NOTE — Progress Notes (Signed)
 He presents that stress today for painful callus plantar aspect of his right foot.  And painful subtalar joint right foot.  He states that in November he had a stroke and is still recovering from that.  Objective: Vital signs stable he is alert oriented x 3.  Pulses are palpable.  He has a solitary benign skin lesion subsecond metatarsal head of the right foot with a pain in the sinus tarsi of the right foot.  Assessment: Plantar benign skin lesion forefoot.  Subtalar joint capsulitis.  Plan: Injected the subtalar joint today with 10 mg of Kenalog and local anesthetic.  This was done after sterile Betadine skin.  Tolerated procedure well.  I also debrided the benign skin lesion for him today.

## 2023-11-24 ENCOUNTER — Institutional Professional Consult (permissible substitution): Admitting: Surgery

## 2023-11-24 VITALS — BP 139/77 | HR 81 | Resp 18 | Ht 72.0 in | Wt 186.0 lb

## 2023-11-24 DIAGNOSIS — I330 Acute and subacute infective endocarditis: Secondary | ICD-10-CM | POA: Diagnosis not present

## 2023-11-24 DIAGNOSIS — I058 Other rheumatic mitral valve diseases: Secondary | ICD-10-CM
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
## 2023-11-27 ENCOUNTER — Encounter: Payer: Medicare Other | Admitting: Surgery

## 2023-11-27 ENCOUNTER — Telehealth: Payer: Self-pay | Admitting: Cardiovascular Disease

## 2023-11-27 ENCOUNTER — Other Ambulatory Visit: Payer: Self-pay | Admitting: Emergency Medicine

## 2023-11-27 DIAGNOSIS — I058 Other rheumatic mitral valve diseases: Secondary | ICD-10-CM

## 2023-11-27 NOTE — Telephone Encounter (Signed)
 Left voicemail, pt needs echo scheduled on 2 months.

## 2023-11-27 NOTE — Telephone Encounter (Signed)
-----   Message from Nurse Tarri Abernethy sent at 11/27/2023  7:56 AM EDT ----- Good Morning,  This is from Dr. Mariah Milling.  Recently seen by Dr. Laneta Simmers, CT surgery  He is requesting a follow-up echocardiogram 2 months time for mitral valve disease  This can be done through our office  Thx  TGollan    I have placed the order. Can you please schedule him for an echo in 2 months?

## 2023-11-28 NOTE — Telephone Encounter (Signed)
Left voicemail to schedule echo.

## 2023-11-30 ENCOUNTER — Encounter: Payer: Self-pay | Admitting: Cardiovascular Disease

## 2023-12-06 ENCOUNTER — Encounter: Payer: Medicare Other | Admitting: Surgery

## 2024-01-30 ENCOUNTER — Ambulatory Visit: Attending: Cardiovascular Disease

## 2024-01-30 DIAGNOSIS — I058 Other rheumatic mitral valve diseases: Secondary | ICD-10-CM

## 2024-01-30 LAB — ECHOCARDIOGRAM COMPLETE
AR max vel: 2.25 cm2
AV Area VTI: 2.42 cm2
AV Area mean vel: 2.24 cm2
AV Mean grad: 5 mmHg
AV Peak grad: 9.2 mmHg
Ao pk vel: 1.52 m/s
Area-P 1/2: 2.95 cm2
MV VTI: 2.18 cm2

## 2024-02-18 ENCOUNTER — Ambulatory Visit: Payer: Self-pay | Admitting: Cardiovascular Disease

## 2024-02-19 ENCOUNTER — Encounter: Payer: Self-pay | Admitting: Emergency Medicine

## 2024-04-06 ENCOUNTER — Other Ambulatory Visit
Admission: RE | Admit: 2024-04-06 | Discharge: 2024-04-06 | Disposition: A | Discharge status: Home / Self Care | Source: Ambulatory Visit | Attending: Emergency Medicine | Admitting: Emergency Medicine

## 2024-04-06 DIAGNOSIS — I6930 Unspecified sequelae of cerebral infarction: Secondary | ICD-10-CM | POA: Diagnosis present

## 2024-04-06 DIAGNOSIS — R531 Weakness: Secondary | ICD-10-CM | POA: Diagnosis present

## 2024-04-06 LAB — BASIC METABOLIC PANEL WITH GFR
Anion gap: 9 (ref 5–15)
BUN: 25 mg/dL — ABNORMAL HIGH (ref 8–23)
CO2: 28 mmol/L (ref 22–32)
Calcium: 9.1 mg/dL (ref 8.9–10.3)
Chloride: 102 mmol/L (ref 98–111)
Creatinine, Ser: 1.15 mg/dL (ref 0.61–1.24)
GFR, Estimated: >60 mL/min (ref >60)
Glucose, Bld: 127 mg/dL — ABNORMAL HIGH (ref 70–99)
Potassium: 3.9 mmol/L (ref 3.5–5.1)
Sodium: 139 mmol/L (ref 135–145)

## 2024-05-15 ENCOUNTER — Ambulatory Visit: Admitting: Podiatry

## 2024-05-15 ENCOUNTER — Other Ambulatory Visit: Payer: Self-pay | Admitting: Podiatry

## 2024-05-15 DIAGNOSIS — M79676 Pain in unspecified toe(s): Secondary | ICD-10-CM | POA: Diagnosis not present

## 2024-05-15 DIAGNOSIS — M19071 Primary osteoarthritis, right ankle and foot: Secondary | ICD-10-CM | POA: Diagnosis not present

## 2024-05-15 DIAGNOSIS — B351 Tinea unguium: Secondary | ICD-10-CM | POA: Diagnosis not present

## 2024-05-15 MED ORDER — GABAPENTIN 300 MG PO CAPS: 300.0000 mg | ORAL_CAPSULE | Freq: Three times a day (TID) | ORAL | 3 refills | Status: DC

## 2024-05-15 MED ORDER — TRIAMCINOLONE ACETONIDE 40 MG/ML IJ SUSP
20.0000 mg | Freq: Once | INTRAMUSCULAR | Status: AC
  Administered 2024-05-15: 20 mg

## 2024-05-15 NOTE — Progress Notes (Signed)
He presents today for a chief complaint of pain to his right ankle as he points to the subtalar joint of the right foot.  He states that he is got thick callus underneath here as he points beneath the first metatarsal head of the right foot.  He is also concerned about a rash on the plantar aspect of the forefoot and his toenails.  Objective: Vital signs stable alert oriented x 3.  Pulses are palpable.  Definitely has pain on palpation of the sinus tarsi and attempted range of motion of the subtalar joint of the right foot which does demonstrate more pes planovalgus positioning than the left.  His toenails bilaterally are thick elongated dystrophic.  He does have a small patch of tinea pedis which appears to be vesicular nature to the plantar aspect of the forefoot between the base of the second toe and the first metatarsal phalangeal joint plantarly.  He also has a benign skin lesion subfirst metatarsal phalangeal joint which appears to be a tyloma.  Assessment: Pain in limb secondary to onychomycosis subtalar joint capsulitis sinus tarsitis benign skin lesion and tinea pedis.  Plan: Discussed etiology pathology and surgical therapies started him on ketoconazole twice daily.  We also injected the right foot today 20 mg Kenalog 5 mg Marcaine after sterile manner and skin prep into the sinus tarsi right foot.  Debrided the nails for him as well and debrided benign skin lesion.  Follow-up with me as needed or in 6 months for diabetic check

## 2024-08-15 ENCOUNTER — Encounter: Payer: Self-pay | Admitting: Nurse Practitioner

## 2024-08-15 ENCOUNTER — Ambulatory Visit: Attending: Nurse Practitioner | Admitting: Nurse Practitioner

## 2024-08-15 VITALS — BP 120/60 | HR 73 | Ht 72.0 in | Wt 198.4 lb

## 2024-08-15 DIAGNOSIS — E785 Hyperlipidemia, unspecified: Secondary | ICD-10-CM | POA: Diagnosis not present

## 2024-08-15 DIAGNOSIS — I10 Essential (primary) hypertension: Secondary | ICD-10-CM | POA: Diagnosis not present

## 2024-08-15 DIAGNOSIS — Z8673 Personal history of transient ischemic attack (TIA), and cerebral infarction without residual deficits: Secondary | ICD-10-CM

## 2024-08-15 DIAGNOSIS — I38 Endocarditis, valve unspecified: Secondary | ICD-10-CM | POA: Diagnosis not present

## 2024-08-15 NOTE — Progress Notes (Signed)
 Office Visit    Patient Name: Jeremy Valencia Date of Encounter: 08/15/2024  Primary Care Provider:  Fernande Ophelia JINNY DOUGLAS, MD Primary Cardiologist:  Evalene Lunger, MD  Cardiology APP:  Vivienne Lonni Ingle, NP   Chief Complaint    83 y.o. 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 07/2023 Echo: 1.16x0.73 cm mobile density on the atrial side of the P2 portion of the posterior mitral valve leaflet concerning for endocarditis; b. 08/2023 TEE: nl EF, large mobile MV opacity; c. 09/2023 TEE: Nl EF, near resolution of MV opacity; d. 01/2024 Echo: EF 60-65% w/o rwma, Gr1DD, nl RV fxn, sev mitral annular Ca2+ w/o MR/MS, AoV sclerosis without stenosis   Diabetes mellitus without complication (HCC)    Diverticulosis    Emphysema, unspecified (HCC)    GERD (gastroesophageal reflux disease)    Hyperlipidemia    Hypertension    Plantar fasciitis    Past Surgical History:  Procedure Laterality Date   APPENDECTOMY     BACK SURGERY     CATARACT EXTRACTION Left    COLONOSCOPY     COLONOSCOPY WITH PROPOFOL  N/A 04/06/2021   Procedure: COLONOSCOPY WITH PROPOFOL ;  Surgeon: Maryruth Ole DASEN, MD;  Location: [facility];  Service: Endoscopy;  Laterality: N/A;  DM   FOOT SURGERY     LAMINECTOMY     right eye     ROTATOR CUFF REPAIR Right    TEE WITHOUT CARDIOVERSION N/A 08/08/2023   Procedure: TRANSESOPHAGEAL ECHOCARDIOGRAM (TEE);  Surgeon: Lunger Evalene JINNY, MD;  Location:  [facility] OR[facility];  Service: Cardiovascular;  Laterality: N/A;   TEE WITHOUT CARDIOVERSION N/A 10/03/2023   Procedure: TRANSESOPHAGEAL ECHOCARDIOGRAM (TEE);  Surgeon: Gollan, Timothy J, MD;  Location: [facility] ORS;  Service: Cardiovascular;  Laterality: N/A;   UPPER GI ENDOSCOPY      Allergies  Allergies[1]     History of Present Illness      83 y.o. 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  Patient was admitted with altered mental status, lethargy, chills, and nausea in November 2024.  Presentation was felt to be consistent with sepsis.  CT of the head showed no acute intracranial hemorrhage or infarct but did show interval development of small focus of cortical encephalomalacia within the right parietal lobe consistent with remote infarct or trauma.  Repeat head CT showed punctate focus in posterior right frontal lobe small acute or subacute infarct and punctate focus in the left parietal lobe.  Blood cultures were negative.  Echo showed normal LV function with grade 1 diastolic dysfunction and a 1.16 x 0.73 cm mobile density on the atrial side of the P2 portion of the posterior mitral valve leaflet concerning for endocarditis.  TEE was performed August 08, 2023 again showing normal LV function with a large mobile opacity attached to the supraventricular side of the mitral valve posterior leaflet concerning for vegetation/endocarditis.  Mild MR without  mitral stenosis was noted.  PICC line was placed and he was subsequently discharged to skilled nursing facility on intravenous ceftriaxone .  Follow-up echo in January 2025 continued to show large mobile opacity in the mitral valve posterior leaflet.  This was followed by repeat TEE showing normal LV function with mild MR and near complete resolution of the previously noted opacity.  He saw CT surgery in 11/2023 with recommendation for  conservative mgmt and surveillance.  Most recent echo in May 2025 showed an EF of 60 to 65% without regional wall motion abnormalities, grade 1 diastolic dysfunction, normal RV function, severe mitral annular calcification without significant MR/MS, and aortic valve sclerosis without stenosis.   Since his last visit, [name] has done well.  As the weather has gotten colder, he has been less active outside but says that he goes grocery shopping regularly and shops at [employer] weekly, where he walks the store without symptoms or limitations.  He denies chest pain, palpitations, dyspnea, pnd, orthopnea, n, v, dizziness, syncope, edema, weight gain, or early satiety.  Objective   Home Medications    Current Outpatient Medications  Medication Sig Dispense Refill   atorvastatin  (LIPITOR) 20 MG tablet Take 20 mg by mouth daily.     clopidogrel  (PLAVIX ) 75 MG tablet Take 1 tablet (75 mg total) by mouth daily.     doxepin (SINEQUAN) 25 MG capsule Take 25 mg by mouth at bedtime.     empagliflozin  (JARDIANCE ) 10 MG TABS tablet Take 10 mg by mouth daily.     gabapentin  (NEURONTIN ) 300 MG capsule Take 1 capsule (300 mg total) by mouth 3 (three) times daily. 270 capsule 3   glimepiride  (AMARYL ) 4 MG tablet Take 4 mg by mouth every morning.     hydrALAZINE  (APRESOLINE ) 25 MG tablet Take 25 mg by mouth 2 (two) times daily.     lisinopril  (ZESTRIL ) 20 MG tablet Take 20 mg by mouth daily.     Multiple Vitamin (MULTIVITAMIN WITH MINERALS) TABS tablet Take 1 tablet by mouth daily.     timolol  (TIMOPTIC ) 0.5 % ophthalmic solution Place 1 drop into the left eye daily.     TURMERIC CURCUMIN PO Take 550 mg by mouth daily.     Ubiquinol 100 MG CAPS Take 100 mg by mouth daily.     vitamin E 180 MG (400 UNITS) capsule Take 400 Units by mouth daily.     Zinc  50 MG TABS Take 50 mg by mouth daily.     No current facility-administered medications for this visit.     Physical Exam    VS:  BP 120/60 (BP Location: Left  Arm, Patient Position: Sitting, Cuff Size: Large)   Pulse 73   Ht 6' (1.829 m)   Wt 198 lb 6 oz (90 kg)   SpO2 98%   BMI 26.90 kg/m  , BMI Body mass index is 26.9 kg/m.          GEN: Well nourished, well developed, in no acute distress. HEENT: normal. Neck: Supple, no JVD, carotid bruits, or masses. Cardiac: RRR, 2/6 systolic murmur heard loudest at the upper sternal borders, no rubs or gallops. No clubbing, cyanosis, edema.  Radials 2+/PT 2+ and equal bilaterally.  Respiratory:  Respirations regular and unlabored, clear to auscultation bilaterally. GI: Soft, nontender, nondistended, BS + x 4. MS: no deformity or atrophy. Skin: warm and dry, no rash. Neuro:  Strength and sensation are intact. Psych: Normal affect.  Accessory Clinical Findings    ECG  personally reviewed by me today - EKG Interpretation Date/Time:  Thursday August 15 2024 11:44:00 EST Ventricular Rate:  73 PR Interval:  178 QRS Duration:  86 QT Interval:  370 QTC Calculation: 407 R Axis:   20  Text Interpretation: Normal sinus rhythm Normal ECG Confirmed by Vivienne Bruckner [phone]) on 08/15/2024 11:48:50 AM  - no acute changes.  Lab Results  Component Value Date   WBC 13.1 (H) 09/28/2023   HGB 15.8 09/28/2023   HCT 45.6 09/28/2023   MCV 98 (H) 09/28/2023   PLT 239 09/28/2023   Lab Results  Component Value Date   CREATININE 1.15 04/06/2024   BUN 25 (H) 04/06/2024   NA 139 04/06/2024   K 3.9 04/06/2024   CL 102 04/06/2024   CO2 28 04/06/2024   Lab Results  Component Value Date   ALT 37 09/12/2023   AST 24 09/12/2023   ALKPHOS 65 09/12/2023   BILITOT 0.9 09/12/2023     Lab Results  Component Value Date   HGBA1C 7.3 (H) 08/01/2023   Labs dated April 06, 2024 from [facility]:  Hemoglobin 16.3, hematocrit 46.8, WBC 8.9, platelets 219  Labs dated February 23, 2024 from [facility]:  Hemoglobin A1c 6.5 Total cholesterol 133, triglycerides 189, HDL 40.3, LDL 55    Assessment &  Plan    1.  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  He subsequently was treated with intravenous antibiotics with TEE in January 2025 showing near resolution of the previously noted opacity in transthoracic echo in May 2025 showing an EF of 60 to 65% with grade 1 diastolic dysfunction, severe mitral annular calcification without significant MR/MS.  He was seen by CT surgery in March 2025 with recommendation for conservative management and surveillance.  Patient has done well over the past several months without symptoms or limitations.  Will plan on follow-up in 1 year with repeat echo at that time.  2.  Primary hypertension:  Stable on acei and hydralazine .  3.  Hyperlipidemia: On atorvastatin  with LDL of 55 in June of this year.  4.  Type 2 diabetes mellitus: A1c of 6.5 earlier this month.  On Jardiance  and Amaryl .  Followed by primary care.  5.  Stroke: Status post bilateral parietal cortical infarcts in November 2024 in the setting of sepsis and culture-negative endocarditis.  No residual deficits.  On clopidogrel  and atorvastatin .  6.  Disposition: Follow-up in 1 year with plan for echocardiogram at that time.  Bruckner Vivienne, NP 08/15/2024, 1:30 PM     [1]  Allergies Allergen Reactions   Metformin Diarrhea   Rofecoxib Other (See Comments)    Unknown   Ceftriaxone  Rash   Lamisil  [Terbinafine ] Rash

## 2024-08-15 NOTE — Patient Instructions (Signed)
 Medication Instructions:   Your physician recommends that you continue on your current medications as directed. Please refer to the Current Medication list given to you today.    *If you need a refill on your cardiac medications before your next appointment, please call your pharmacy*  Lab Work:  None ordered at this time   If you have labs (blood work) drawn today and your tests are completely normal, you will receive your results only by:  MyChart Message (if you have MyChart) OR  A paper copy in the mail If you have any lab test that is abnormal or we need to change your treatment, we will call you to review the results.  Testing/Procedures:  None ordered at this time   Referrals:  None ordered at this time   Follow-Up:  At [facility], you and your health needs are our priority.  As part of our continuing mission to provide you with exceptional heart care, our providers are all part of one team.  This team includes your primary Cardiologist (physician) and Advanced Practice Providers or APPs (Physician Assistants and Nurse Practitioners) who all work together to provide you with the care you need, when you need it.  Your next appointment:   1 year(s)  Provider:    Timothy Gollan, MD    We recommend signing up for the patient portal called MyChart.  Sign up information is provided on this After Visit Summary.  MyChart is used to connect with patients for Virtual Visits (Telemedicine).  Patients are able to view lab/test results, encounter notes, upcoming appointments, etc.  Non-urgent messages can be sent to your provider as well.   To learn more about what you can do with MyChart, go to [email or web address].

## 2024-08-19 ENCOUNTER — Other Ambulatory Visit: Payer: Self-pay

## 2024-08-19 ENCOUNTER — Ambulatory Visit: Admitting: Podiatry

## 2024-08-19 DIAGNOSIS — D2371 Other benign neoplasm of skin of right lower limb, including hip: Secondary | ICD-10-CM | POA: Diagnosis not present

## 2024-08-19 DIAGNOSIS — B351 Tinea unguium: Secondary | ICD-10-CM

## 2024-08-19 DIAGNOSIS — M19071 Primary osteoarthritis, right ankle and foot: Secondary | ICD-10-CM | POA: Diagnosis not present

## 2024-08-19 DIAGNOSIS — M79676 Pain in unspecified toe(s): Secondary | ICD-10-CM | POA: Diagnosis not present

## 2024-08-19 MED ORDER — GABAPENTIN 300 MG PO CAPS: 300.0000 mg | ORAL_CAPSULE | Freq: Three times a day (TID) | ORAL | 3 refills | Status: AC

## 2024-08-19 MED ORDER — DEXAMETHASONE SODIUM PHOSPHATE 120 MG/30ML IJ SOLN: 2.0000 mg | Freq: Once | INTRAMUSCULAR | Status: AC

## 2024-08-19 NOTE — Progress Notes (Signed)
 He presents today for a chief complaint of pain to his right ankle as he points to the subtalar joint of the right foot.  He states that he is got thick callus underneath here as he points beneath the first metatarsal head of the right foot.  He is also concerned about a rash on the plantar aspect of the forefoot and his toenails.  And painful elongated toenails  Objective: Vital signs stable alert oriented x 3.  Pulses are palpable.  Definitely has pain on palpation of the sinus tarsi and attempted range of motion of the subtalar joint of the right foot which does demonstrate more pes planovalgus positioning than the left.  His toenails bilaterally are thick elongated dystrophic.  He also has a benign skin lesion subfirst metatarsal phalangeal joint which appears to be a tyloma.  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  Plan: Discussed etiology pathology and surgical therapies started him on ketoconazole  twice daily.  We also injected the right foot today 20 mg Kenalog  5 mg Marcaine after sterile manner and skin prep into the sinus tarsi right foot.  Debrided the nails for him as well and debrided benign skin lesion.  Follow-up with me as needed or in 6 months for diabetic check.  We refilled his gabapentin  today.

## 2024-11-20 ENCOUNTER — Ambulatory Visit: Admitting: Podiatry
